# Patient Record
Sex: Female | Born: 1955 | Hispanic: Yes | Marital: Married | Smoking: Current some day smoker
Health system: Southern US, Community
[De-identification: ages and names within clinical notes are randomized; demographics above are authoritative.]

## PROBLEM LIST (undated history)

## (undated) DIAGNOSIS — E785 Hyperlipidemia, unspecified: Secondary | ICD-10-CM

## (undated) DIAGNOSIS — F32A Depression, unspecified: Secondary | ICD-10-CM

## (undated) DIAGNOSIS — M1990 Unspecified osteoarthritis, unspecified site: Secondary | ICD-10-CM

## (undated) DIAGNOSIS — N644 Mastodynia: Secondary | ICD-10-CM

## (undated) DIAGNOSIS — G4700 Insomnia, unspecified: Secondary | ICD-10-CM

## (undated) DIAGNOSIS — D496 Neoplasm of unspecified behavior of brain: Secondary | ICD-10-CM

## (undated) DIAGNOSIS — D320 Benign neoplasm of cerebral meninges: Secondary | ICD-10-CM

## (undated) DIAGNOSIS — F419 Anxiety disorder, unspecified: Secondary | ICD-10-CM

## (undated) DIAGNOSIS — F329 Major depressive disorder, single episode, unspecified: Secondary | ICD-10-CM

## (undated) DIAGNOSIS — I10 Essential (primary) hypertension: Secondary | ICD-10-CM

## (undated) DIAGNOSIS — D329 Benign neoplasm of meninges, unspecified: Secondary | ICD-10-CM

## (undated) DIAGNOSIS — M797 Fibromyalgia: Secondary | ICD-10-CM

## (undated) HISTORY — DX: Hyperlipidemia, unspecified: E78.5

## (undated) HISTORY — PX: CARPAL TUNNEL RELEASE: SHX101

## (undated) HISTORY — DX: Major depressive disorder, single episode, unspecified: F32.9

## (undated) HISTORY — DX: Benign neoplasm of cerebral meninges: D32.0

## (undated) HISTORY — DX: Mastodynia: N64.4

## (undated) HISTORY — DX: Anxiety disorder, unspecified: F41.9

## (undated) HISTORY — DX: Unspecified osteoarthritis, unspecified site: M19.90

## (undated) HISTORY — PX: ABDOMINAL HYSTERECTOMY: SHX81

## (undated) HISTORY — DX: Depression, unspecified: F32.A

## (undated) HISTORY — PX: BILATERAL CARPAL TUNNEL RELEASE: SHX6508

## (undated) HISTORY — PX: BREAST SURGERY: SHX581

## (undated) HISTORY — PX: EYE SURGERY: SHX253

## (undated) HISTORY — DX: Insomnia, unspecified: G47.00

## (undated) HISTORY — DX: Essential (primary) hypertension: I10

## (undated) HISTORY — DX: Benign neoplasm of meninges, unspecified: D32.9

---

## 1978-06-15 HISTORY — PX: BREAST BIOPSY: SHX20

## 1983-06-16 HISTORY — PX: BREAST BIOPSY: SHX20

## 2008-06-15 HISTORY — PX: BELPHAROPTOSIS REPAIR: SHX369

## 2008-06-15 HISTORY — PX: ABDOMINAL HYSTERECTOMY: SHX81

## 2009-07-17 ENCOUNTER — Emergency Department: Payer: Self-pay | Admitting: Emergency Medicine

## 2009-08-01 ENCOUNTER — Ambulatory Visit: Payer: Self-pay | Admitting: Internal Medicine

## 2010-01-13 ENCOUNTER — Ambulatory Visit: Payer: Self-pay | Admitting: Internal Medicine

## 2010-02-13 ENCOUNTER — Ambulatory Visit: Payer: Self-pay | Admitting: Internal Medicine

## 2010-08-07 ENCOUNTER — Ambulatory Visit: Payer: Self-pay | Admitting: Internal Medicine

## 2010-08-14 ENCOUNTER — Emergency Department: Payer: Self-pay | Admitting: Emergency Medicine

## 2010-08-14 ENCOUNTER — Emergency Department (HOSPITAL_COMMUNITY)
Admission: EM | Admit: 2010-08-14 | Discharge: 2010-08-15 | Disposition: A | Discharge status: Home / Self Care | Payer: MEDICARE | Attending: Emergency Medicine | Admitting: Emergency Medicine

## 2010-08-14 DIAGNOSIS — M25529 Pain in unspecified elbow: Secondary | ICD-10-CM | POA: Insufficient documentation

## 2010-08-14 DIAGNOSIS — F329 Major depressive disorder, single episode, unspecified: Secondary | ICD-10-CM | POA: Insufficient documentation

## 2010-08-14 DIAGNOSIS — I10 Essential (primary) hypertension: Secondary | ICD-10-CM | POA: Insufficient documentation

## 2010-08-14 DIAGNOSIS — F3289 Other specified depressive episodes: Secondary | ICD-10-CM | POA: Insufficient documentation

## 2010-08-14 DIAGNOSIS — E039 Hypothyroidism, unspecified: Secondary | ICD-10-CM | POA: Insufficient documentation

## 2010-08-14 DIAGNOSIS — M25569 Pain in unspecified knee: Secondary | ICD-10-CM | POA: Insufficient documentation

## 2010-08-14 DIAGNOSIS — Z79899 Other long term (current) drug therapy: Secondary | ICD-10-CM | POA: Insufficient documentation

## 2010-08-14 DIAGNOSIS — M546 Pain in thoracic spine: Secondary | ICD-10-CM | POA: Insufficient documentation

## 2010-09-18 ENCOUNTER — Ambulatory Visit: Payer: Self-pay | Admitting: Internal Medicine

## 2011-02-06 ENCOUNTER — Other Ambulatory Visit: Payer: Self-pay | Admitting: *Deleted

## 2011-02-06 NOTE — Telephone Encounter (Signed)
Per Dr. Dan Humphreys we can not call this into pharmacy in [place]. Tried calling patient to let her know that we would be able to call it in here, got no answer. Left message asking that she call me back.

## 2011-02-06 NOTE — Telephone Encounter (Signed)
Fine to fill for one month supply, 1 refill, but MUST set up follow up appointment.

## 2011-02-06 NOTE — Telephone Encounter (Signed)
Patient wants this called in to [facility] in [place].

## 2011-02-09 ENCOUNTER — Other Ambulatory Visit: Payer: Self-pay | Admitting: Internal Medicine

## 2011-02-09 DIAGNOSIS — G4700 Insomnia, unspecified: Secondary | ICD-10-CM

## 2011-02-09 MED ORDER — ZOLPIDEM TARTRATE 5 MG PO TABS: 5.0000 mg | ORAL_TABLET | Freq: Every evening | ORAL | Status: DC | PRN

## 2011-02-09 NOTE — Telephone Encounter (Signed)
Fine to fill one month of this medication, but pt must set up appointment.  And, we cannot [place] of [place].  (She has requested this in the past.)

## 2011-02-09 NOTE — Telephone Encounter (Signed)
Fine to fill one month. Pt must set up an appointment

## 2011-02-10 ENCOUNTER — Telehealth: Payer: Self-pay | Admitting: Internal Medicine

## 2011-02-10 NOTE — Telephone Encounter (Signed)
FAX NUMBER TO [place] [facility] [phone] PER PATIENT HUSBAND

## 2011-02-10 NOTE — Telephone Encounter (Signed)
Needs a rx for predisone   Sent to [facility] in [place]  Please advise pt husbands or if you have any question please call him

## 2011-02-11 ENCOUNTER — Other Ambulatory Visit: Payer: Self-pay | Admitting: Internal Medicine

## 2011-02-11 NOTE — Telephone Encounter (Signed)
I will not provide refills on medications to a [place] pharmacy.  I will not refill or prescribe prednisone. Pt should find a provider in [place] if they have moved there.

## 2011-02-12 ENCOUNTER — Other Ambulatory Visit: Payer: Self-pay | Admitting: Internal Medicine

## 2011-02-12 NOTE — Telephone Encounter (Signed)
Patient's husband notified  

## 2011-02-27 ENCOUNTER — Telehealth: Payer: Self-pay | Admitting: Internal Medicine

## 2011-02-27 NOTE — Telephone Encounter (Signed)
Spoke with husband. He says that patient has been complaining of side pain that is worse after eating. Has been going on for a few days. I offered an appt for Monday, but husband says that he wanted her seen today because he is off work. I advised him that we have no openings today. He scheduled her an appt for next wed because he is off that day because she also needs some refills on some of her meds. He is asking if she can be worked in today,. Please advise.

## 2011-02-27 NOTE — Telephone Encounter (Signed)
I looked and the schedule (for both myself and teresa) is full today. She will need to go to urgent care or ED, or be seen next week.

## 2011-02-27 NOTE — Telephone Encounter (Signed)
Patient advised.

## 2011-02-27 NOTE — Telephone Encounter (Signed)
Husband called said his wife was out of state and just come back and is complaining of side pain

## 2011-03-04 ENCOUNTER — Ambulatory Visit (INDEPENDENT_AMBULATORY_CARE_PROVIDER_SITE_OTHER): Payer: Medicare Other | Admitting: Internal Medicine

## 2011-03-04 ENCOUNTER — Ambulatory Visit: Payer: Self-pay | Admitting: Internal Medicine

## 2011-03-04 ENCOUNTER — Encounter: Payer: Self-pay | Admitting: Internal Medicine

## 2011-03-04 VITALS — BP 150/89 | HR 65 | Temp 98.9°F | Resp 20 | Ht 60.5 in | Wt 181.0 lb

## 2011-03-04 DIAGNOSIS — F411 Generalized anxiety disorder: Secondary | ICD-10-CM

## 2011-03-04 DIAGNOSIS — R1011 Right upper quadrant pain: Secondary | ICD-10-CM

## 2011-03-04 DIAGNOSIS — F419 Anxiety disorder, unspecified: Secondary | ICD-10-CM

## 2011-03-04 DIAGNOSIS — I10 Essential (primary) hypertension: Secondary | ICD-10-CM

## 2011-03-04 DIAGNOSIS — H6090 Unspecified otitis externa, unspecified ear: Secondary | ICD-10-CM

## 2011-03-04 DIAGNOSIS — H60399 Other infective otitis externa, unspecified ear: Secondary | ICD-10-CM

## 2011-03-04 LAB — COMPREHENSIVE METABOLIC PANEL
ALT: 19 U/L (ref 0–35)
Albumin: 4.2 g/dL (ref 3.5–5.2)
Alkaline Phosphatase: 91 U/L (ref 39–117)
CO2: 26 mEq/L (ref 19–32)
GFR: 80.98 mL/min (ref >60.00)
Glucose, Bld: 129 mg/dL — ABNORMAL HIGH (ref 70–99)
Potassium: 3.9 mEq/L (ref 3.5–5.1)
Sodium: 141 mEq/L (ref 135–145)
Total Protein: 7.5 g/dL (ref 6.0–8.3)

## 2011-03-04 LAB — CBC WITH DIFFERENTIAL/PLATELET
Eosinophils Relative: 3.2 % (ref 0.0–5.0)
Monocytes Relative: 7.9 % (ref 3.0–12.0)
Neutrophils Relative %: 70.8 % (ref 43.0–77.0)
Platelets: 325 10*3/uL (ref 150.0–400.0)
WBC: 7.6 10*3/uL (ref 4.5–10.5)

## 2011-03-04 MED ORDER — ALPRAZOLAM 2 MG PO TABS: 2.0000 mg | ORAL_TABLET | Freq: Two times a day (BID) | ORAL | Status: DC | PRN

## 2011-03-04 MED ORDER — CIPROFLOXACIN-DEXAMETHASONE 0.3-0.1 % OT SUSP: 4.0000 [drp] | Freq: Two times a day (BID) | OTIC | Status: DC

## 2011-03-04 NOTE — Progress Notes (Signed)
Subjective:    Patient ID: Alice Simmons, female    DOB: 1956/05/09, 55 y.o.   MRN: [id number]  HPI Alice Simmons is a 55 year old female with a history of anxiety who presents for an acute visit complaining of right upper quadrant abdominal pain, nausea, vomiting which has been persistent for approximately 2 weeks.  In regards to the abdominal pain she notes that it first developed approximately 2-3 weeks ago. It occurred after she to large meal consisting of pork. Since that time she has had intermittent severe right upper quadrant pain with nausea and vomiting after eating meals, especially with fatty foods. She denies any diarrhea, bloody stools. She does occasionally have constipation.  She also complains of one to 2 days of sore throat and right ear pain. She denies any fever, chills, shortness of breath, palpitations. She has drainage from her right ear and reports that it is tender to touch.  Outpatient Encounter Prescriptions as of 03/04/2011  Medication Sig Dispense Refill  . alprazolam (XANAX) 2 MG tablet Take 1 tablet (2 mg total) by mouth 2 (two) times daily as needed.  60 tablet  5  . labetalol (NORMODYNE) 300 MG tablet Take 300 mg by mouth 2 (two) times daily.        Marland Kitchen DISCONTD: alprazolam (XANAX) 2 MG tablet Take 2 mg by mouth 2 (two) times daily as needed.        . ciprofloxacin-dexamethasone (CIPRODEX) otic suspension Place 4 drops into the right ear 2 (two) times daily.  7.5 mL  0  . lisinopril (PRINIVIL,ZESTRIL) 40 MG tablet Take 40 mg by mouth daily.       Marland Kitchen PARoxetine (PAXIL) 30 MG tablet Take 30 mg by mouth 2 (two) times daily.       . propranolol (INDERAL) 40 MG tablet Take 40 mg by mouth 2 (two) times daily.       Marland Kitchen zolpidem (AMBIEN) 5 MG tablet Take 1 tablet (5 mg total) by mouth at bedtime as needed for sleep.  30 tablet  0    Review of Systems  Constitutional: Negative for fever, chills, appetite change, fatigue and unexpected weight change.  HENT: Positive for ear  pain, congestion, sore throat and postnasal drip. Negative for trouble swallowing, neck pain, voice change and sinus pressure.   Eyes: Negative for visual disturbance.  Respiratory: Negative for cough, shortness of breath, wheezing and stridor.   Cardiovascular: Negative for chest pain, palpitations and leg swelling.  Gastrointestinal: Positive for nausea, vomiting and abdominal pain. Negative for diarrhea, constipation, blood in stool, abdominal distention and anal bleeding.  Genitourinary: Negative for dysuria and flank pain.  Musculoskeletal: Negative for myalgias, arthralgias and gait problem.  Skin: Negative for color change and rash.  Neurological: Negative for dizziness and headaches.  Hematological: Negative for adenopathy. Does not bruise/bleed easily.  Psychiatric/Behavioral: Negative for suicidal ideas, sleep disturbance and dysphoric mood. The patient is nervous/anxious.    BP 150/89  Pulse 65  Temp(Src) 98.9 F (37.2 C) (Oral)  Resp 20  Ht 5' 0.5" (1.537 m)  Wt 181 lb (82.101 kg)  BMI 34.77 kg/m2  SpO2 95%     Objective:   Physical Exam  Constitutional: She is oriented to person, place, and time. She appears well-developed and well-nourished. No distress.  HENT:  Head: Normocephalic and atraumatic.  Right Ear: Hearing, tympanic membrane and external ear normal.  Left Ear: Hearing, tympanic membrane and external ear normal.  Nose: Nose normal.  Mouth/Throat: Oropharynx is clear and moist.  No oropharyngeal exudate.       Right ear canal is erythematous with some debris, tragus is slighlty tender to palpation  Eyes: Conjunctivae are normal. Pupils are equal, round, and reactive to light. Right eye exhibits no discharge. Left eye exhibits no discharge. No scleral icterus.  Neck: Normal range of motion. Neck supple. No tracheal deviation present. No thyromegaly present.  Cardiovascular: Normal rate, regular rhythm, normal heart sounds and intact distal pulses.  Exam reveals  no gallop and no friction rub.   No murmur heard. Pulmonary/Chest: Effort normal and breath sounds normal. No respiratory distress. She has no wheezes. She has no rales. She exhibits no tenderness.  Abdominal: Soft. There is tenderness in the right upper quadrant. There is guarding and positive Murphy's sign.  Musculoskeletal: Normal range of motion. She exhibits no edema and no tenderness.  Lymphadenopathy:    She has no cervical adenopathy.  Neurological: She is alert and oriented to person, place, and time. No cranial nerve deficit. She exhibits normal muscle tone. Coordination normal.  Skin: Skin is warm and dry. No rash noted. She is not diaphoretic. No erythema. No pallor.  Psychiatric: She has a normal mood and affect. Her behavior is normal. Judgment and thought content normal.          Assessment & Plan:  1. RUQ abdominal pain - patient with right upper quadrant pain and nausea and vomiting after eating fatty foods which is consistent with cholecystitis. Will get CBC and CMP with labs today. Will get a right upper quadrant ultrasound for evaluation. I will call them with results.  2. Right Otitis Externa - patient with right ear pain and exam consistent with right otitis externa. Will treat with Ciprodex. She has a followup in one week. She will call sooner if symptoms are not improving.  3. Anxiety - patient with general anxiety disorder. Doing very well using alprazolam. Her husband notes some difficulty sleeping at night recently we will plan to address this at her visit in one week once acute issues with right upper quadrant abdominal pain have resolved.  4. Hypertension - blood pressure is elevated today, however, improved compared to previous readings. We'll plan to check renal function with labs today. She will return to clinic in one week.

## 2011-03-04 NOTE — Patient Instructions (Signed)
Gallbladder Disease (Cholecystitis) Gallbladder disease (cholecystitis) is an inflammation of your gallbladder. It is usually caused by a build-up of stones (gallstones) or sludge (cholelithiasis) in your gallbladder. The gallbladder is not an essential organ. It is located slightly to the right of center in the belly (abdomen), behind the liver. It stores bile made in the liver. Bile aids in digestion of fats. Gallbladder disease may result in nausea (feeling sick to your stomach), abdominal pain, and jaundice. In severe cases, emergency surgery may be required. The most common type of gallbladder disease is gallstones. They begin as small crystals and slowly grow into stones. Gallstone pain occurs when the bile duct has spasms. The spasms are caused by the stone passing out of the duct. The stone is trying to pass at the same time bile is passing into the small bowel for digestion. The pain usually begins suddenly. It may persist from several minutes to several hours. Infection can occur. Infection can add to discomfort and severity of an acute attack. The pain may be made worse by breathing deeply or by being jarred. There may be fever and tenderness to the touch. In some cases, when gallstones do not move into the bile duct, people have no pain or symptoms. These are called "silent" gallstones. Women are three times more likely to develop gallstones than men. Women who have had several pregnancies are more likely to have gallbladder disease. Physicians sometimes advise removing diseased gallbladders before future pregnancies. Other factors that increase the risk of gallbladder disease are obesity, diets heavy in fried foods and dairy products, increasing age, prolonged use of medications containing female hormones, and heredity. HOME CARE INSTRUCTIONS  If your physician prescribed an antibiotic, take as directed.   Only take over-the-counter or prescription medicines for pain, discomfort, or fever as  directed by your caregiver.   Follow a low fat diet until seen again. (Fat causes the gallbladder to contract.)   Follow-up as instructed. Attacks are almost always recurrent and surgery is usually required for permanent treatment.  SEEK IMMEDIATE MEDICAL CARE IF:  Pain is increasing and not controlled by medications.   The pain moves to another part of your abdomen or to your back. (Right sided pain can be appendicitis and left sided pain in adults can be diverticulitis).   You have an oral temperature above 101.5 develops, is not controlled by medication, or as your caregiver suggests.   You develop nausea and vomiting.  Document Released: 06/01/2005 Document Re-Released: 11/18/2007 [facility] Patient Information 2011 [place], [place]. Dejar de fumar (Smoking Cessation) Este folleto explica la mejor manera de dejar de fumar, as como los nuevos tratamientos que podrn [place]. Le proporciona informacin acerca de los nuevos medicamentos que pueden doblar o Media planner las posibilidades de que usted abandone el hbito de fumar y de que lo haga para siempre. Tambin le indica el modo de evitar las recadas y responder a Environmental manager preocupaciones que usted pueda tener, incluyendo el aumento de [place].  LA NICOTINA: UNA ADICCIN PODEROSA  Si usted ha tratado [place] vez dejar de fumar, ya sabe lo difcil que resulta. Es una tarea ardua porque la nicotina es una droga [place]. Para algunas personas, puede ser tan adictiva como la herona o la cocana. Abandonar el hbito es muy difcil. Generalmente, se hacen 2  3 intentos, o ms, antes de poder dejarlo definitivamente. Cada vez que usted haga un intento, aprender qu cosas lo ayudan y que cosas lo perjudican. Dejar de fumar es una tarea difcil  y conlleva un gran esfuerzo, pero usted Buyer, retail. ABANDONAR EL HBITO DE FUMAR ES UNA DE LAS COSAS MS IMPORTANTES QUE HABR HECHO EN SU VIDA:  Tendr una vida ms larga y de mejor calidad.   El  impacto en el organismo se siente casi inmediatamente.   Dentro de los 20 minutos la presin arterial disminuye. El pulso vuelve a sus valores normales.   Despus de 8 horas, los niveles de monxido de carbono en la sangre vuelven a la normalidad. Aumenta el nivel de oxgeno.   Despus de 24 horas, la probabilidad de infarto comienza a disminuir. La respiracin, el cabello y el cuerpo ya no huelen a humo.   Luego de 48 horas, los nervios daados comienzan a recuperarse. Mejoran el sentido del gusto y Cabin crew.   Luego de 72 horas, el organismo est virtualmente libre de nicotina. Los conductos bronquiales se relajan, la respiracin se normaliza.   Despus de 2 a 12 semanas, los pulmones pueden contener ms aire. Se facilita la actividad fsica y mejora la respiracin.   Disminuir las probabilidades de sufrir ataques al corazn, derrames cerebrales, cncer o enfermedades pulmonares.   Despus de 1 ao, el riesgo de coronariopatas disminuye a la mitad.   Despus de 5 aos, el riesgo de ictus disminuye al nivel de un no fumador.   Despus de 10 aos, el riesgo de cncer de pulmn disminuye a la mitad, y el riesgo de sufrir otros tipos de cncer disminuye considerablemente.   Despus de 15 aos, el riesgo de coronariopatas disminuye, generalmente al nivel de un no fumador.   Si est embarazada, al dejar de fumar aumentar las probabilidades de tener un beb sano.   Las personas con las que [place], especialmente sus hijos, estarn ms saludables.   Tendr dinero extra para gastar en otras cosas que no sean cigarrillos.  CINCO CLAVES PARA DEJAR DE FUMAR Algunos estudios han demostrado que estos cinco pasos lo ayudarn a abandonar el hbito y hacerlo para siempre. Usted tiene mejores probabilidades de abandonar el hbito si cumple con estos pasos al mismo tiempo:  1. Preprese. 2. Busque apoyo y [place]. 3. Aprenda nuevas destrezas y conductas. 4. Consiga medicamentos para reducir la  adiccin a la nicotina y selos correctamente. 5. Preprese para cuando sufra una recada o una situacin difcil y tome la determinacin de seguir tratando de abandonar el hbito, aunque no tenga xito al principio. 1. PREPRESE  Establezca una fecha para dejar de fumar.   Modifique su entorno.   Deshgase de TODOS los cigarrillos y ceniceros que haya en su casa, en el auto y en el lugar de [place].   No permita que nadie fume dentro de su casa.   Repase sus intentos anteriores. Piense en qu cosas funcionaron y cules no.   Una vez que haya dejado, no fume ms, NI SIQUIERA UNA BOCANADA!  2. BUSQUE AYUDA Y ESTMULO Algunos estudios han demostrado que usted tiene mejores probabilidades de tener xito si [place]. Puede obtener apoyo de [employer]:  Dgale a sus familiares, amigos y compaeros de trabajo que usted dejar de fumar y que necesita su apoyo. Pdales que no fumen alrededor suyo y que no dejen cigarrillos a la vista.   Hable con el profesional que lo asiste (por ejemplo, con su mdico, dentista, enfermero, farmacutico, psiclogo).   Obtenga consejos y apoyo individual, grupal o telefnico. Cuanto ms consejera reciba, mejores sern las posibilidades de que deje de fumar. Hay programas que se ofrecen  en hospitales y centros mdicos locales. Comunquese con el departamento de salud de su localidad para obtener informacin acerca de los programas disponibles en su rea.   Las creencias y prcticas espirituales pueden ayudar a los fumadores a abandonar el hbito.   Los medidores para dejar de fumar son pequeos programas computarizados que mantienen el registro estadstico del "tiempo de dejar", cigarrillos no fumados, dinero ahorrado (por ejemplo vase la pgina web [email or web address]).   Muchos fumadores encuentran uno o ms de los muchos libros de [place] disponibles son tiles para ayudarlo a dejar de fumar.  3. APRENDA NUEVAS DESTREZAS Y  CONDUCTAS  Trate de entretenerse con otra cosa cuando sienta ganas de fumar. Hable con alguien, salga a caminar u ocpese en alguna tarea.   Cuando comience a abandonar el hbito de fumar, [place]. Utilice una ruta diferente para llegar al [place]. Beba t en vez de caf. Desayune en un lugar diferente.   Haga algo para reducir la tensin emocional. Tome un bao caliente, practique alguna actividad fsica o lea un libro.   Planee hacer cada da algo que disfrute. Recompnsese por no fumar.   Vale la pena explorar los programas informticos interactivos que se especializan en ayudarlo a dejar de fumar.  4. OBTENGA LOS MEDICAMENTOS Y UTILCELOS CORRECTAMENTE. Hay medicamentos que pueden ayudarlo a dejar de fumar y a reducir las ganas. Si combina los [employer] con los mtodos de [place] y el apoyo pueden Nurse, learning disability las probabilidades de dejar de fumar exitosamente. La Administracin de Alimentos y Engineer, agricultural de Surveyor, minerals., (FDA) ha aprobado 7 medicamentos que lo ayudarn a International aid/development worker. Estos medicamentos se dividen en 3 categoras.  La terapia de reemplazo de nicotina (enva nicotina al organismo sin los [place] y los riesgos del fumar).   Goma de Theatre manager de nicotina-disponible sin receta   Pastillas de nicotina-disponible sin Armed forces logistics/support/administrative officer de nicotina-disponible con receta   Atomizador nasal de nicotina-disponible con receta   Parche de nicotina transdrmico-disponible con receta y sin receta.   Los antidepresivos ayudan a las personas a Animal nutritionist de Art therapist, Biomedical engineer no se conoce cul es el mecanismo.   Comprimidos de Bupropion SR-disponible con receta   Agonistas receptores de nicotina (simulan el efecto de la nicotina en el cerebro)   Comprimidos de Varenicline Tartrate-disponible con receta   Consulte con su mdico para pedir consejo acerca de los medicamentos que [place], cmo usarlos y lea detenidamente la informacin del envase.   Todos los que tratan de dejar de  fumar pueden beneficiarse con el uso de un medicamento. Si est embarazada o trata de quedar embarazada, est amamantando, tiene menos de 18 aos o fuma menos de 10 cigarrillos por Futures trader, converse con su mdico antes de usar los medicamentos de reemplazo de la nicotina.   Debe dejar de usar los reemplazos de la nicotina y Freight forwarder a su mdico si siente nuseas, mareos, debilidad, mareos, vmitos, latidos cardacos rpidos, problemas en la boca debido a las pastillas o a la goma de Theatre manager, u observa enrojecimiento o hinchazn en la piel que rodea el parche y no mejora.   No use otro producto que contenga nicotina mientras use un reemplazo.   Converse con su mdico antes de usar estos productos si tiene diabetes, sufre una enfermedad cardaca, asma o lceras de estmago, ha sufrido recientemente un infarto, tiene hipertensin arterial que no puede controlar con medicamentos, tiene una historia de latidos cardacos irregulares o le han prescripto medicamentos para ayudarlo a dejar de fumar.  5. EST PREPARADO PARA UNA RECADA O PARA SITUACIONES DIFCILES.  La mayor parte de las recadas se producen dentro de los 3 primeros meses de abandonar el hbito. No se desanime si comienza a fumar de nuevo. Recuerde, la [employer] tratan varias veces de dejar de fumar antes de lograrlo.   Podr sufrir sndrome de abstinencia porque su cuerpo est acostumbrado a la nicotina, la sustancia adictiva que [employer]. Podr sentir el deseo compulsivo de fumar, irritabilidad, enojo, tos, cefaleas y dificultad para concentrarse.   Estos sntomas son transitorios. Son ms intensos en un comienzo, pero desaparecern en 10 a 14 das.  He aqu algunas situaciones difciles de las cuales hay que estar prevenido:   Alcohol. Evite consumir alcohol. El beber disminuye sus posibilidades de xito.   Cafena. Reduzca la cantidad de cafena que consume, porque su ingesta disminuye la probabilidad de xito.    Otros fumadores. Estar rodeado de personas que fuman puede hacer que usted desee [place]. Evite a los fumadores   [employer]. Muchos fumadores aumentarn de peso cuando dejen de fumar, generalmente menos de 4,5 Kg. Consuma una dieta saludable y [place]. No deje que el aumento de peso lo distraiga de su meta principal que es dejar de fumar. Algunos medicamentos para dejar de fumar pueden retrasar el aumento de [place]. Siempre podr perder [employer] que se gane despus de dejar de fumar.   Mal humor o depresin. Hay muchas formas de mejorar su estado de nimo en lugar de hacerlo fumando.  Si tiene problemas con alguna de estas situaciones, converse con su mdico. SITUACIONES O CONDICIONES ESPECIALES Algunos estudios indican que todas las personas pueden dejar de fumar. Su situacin o condicin pueden darle un motivo especial para abandonar este hbito.  Mujeres embarazadas o mams recientes: Al dejar de fumar, protege la salud del beb y la suya.   Pacientes hospitalizados: Al abandonar el hbito se reducen los problemas de salud y se ayuda a la curacin.   Pacientes que han sufrido un ataque al corazn: De este modo se reduce el riesgo de un segundo ataque al corazn.   Pacientes con cncer de pulmn, cabeza y cuello: Al dejar de fumar se reduce el riesgo de un segundo cncer.   Padres de nios y adolescentes: Si abandona el hbito, protege a sus nios y Automotive engineer de enfermedades causadas por el humo como fumador pasivo.  PREGUNTAS PARA REFLEXIONAR Piense en las siguientes preguntas antes de tratar de dejar de fumar. Quizs desee hablar acerca de sus preguntas con el profesional que lo asiste.  Por qu desea dejar de fumar?   Cundo trat de dejar de fumar en el pasado, qu le ayud y qu no le ayud?   Cules sern las situaciones ms difciles para usted despus de dejar de fumar? Cmo planea manejarlas?   Quin puede ayudarlo en los momentos difciles? Su familia?  Sus amigos? El profesional que lo asiste?   Qu placeres obtiene cuando fuma? De qu manera puede seguir obteniendo placer si abandona el hbito?  Estas son algunas preguntas para hacrselas al profesional que lo asiste.  Cmo puede ayudarme a dejar de fumar con xito?   Qu medicamento cree que sera el mejor para m, y cmo debo tomarlo?   Qu debo hacer si necesito ms ayuda?   Cmo es la desintoxicacin del cigarrillo? Cmo puedo obtener informacin acerca de la desintoxicacin?  Dejar de fumar es una tarea difcil y conlleva un gran esfuerzo, Biomedical engineer  usted Buyer, retail. PARA OBTENER MS INFORMACIN Smokefree.gov (http://www.davis-sullivan.com/) provee asistencia tcnica profesional e informacin precisa, gratuita, y basada en evidencia para ayudar a las necesidades inmediatas y de largo plazo de las personas que dejan de fumar.  Document Released: 06/01/2005 Document Re-Released: 11/19/2009 [facility] Patient Information 2011 [place], [place].

## 2011-03-05 ENCOUNTER — Telehealth: Payer: Self-pay | Admitting: Internal Medicine

## 2011-03-05 NOTE — Telephone Encounter (Signed)
Talked with husband and informed him that Alice Simmons's appointment was setup but he is going to call and setup a time with his schedule.  I faxed her OV notes and her labs to Dr. Katrinka Blazing.

## 2011-03-10 ENCOUNTER — Telehealth: Payer: Self-pay | Admitting: Internal Medicine

## 2011-03-10 ENCOUNTER — Encounter: Payer: Self-pay | Admitting: Internal Medicine

## 2011-03-10 ENCOUNTER — Ambulatory Visit (INDEPENDENT_AMBULATORY_CARE_PROVIDER_SITE_OTHER): Payer: Medicare Other | Admitting: Internal Medicine

## 2011-03-10 DIAGNOSIS — H6090 Unspecified otitis externa, unspecified ear: Secondary | ICD-10-CM

## 2011-03-10 DIAGNOSIS — R1011 Right upper quadrant pain: Secondary | ICD-10-CM

## 2011-03-10 DIAGNOSIS — K819 Cholecystitis, unspecified: Secondary | ICD-10-CM

## 2011-03-10 DIAGNOSIS — H60399 Other infective otitis externa, unspecified ear: Secondary | ICD-10-CM

## 2011-03-10 DIAGNOSIS — G4700 Insomnia, unspecified: Secondary | ICD-10-CM

## 2011-03-10 MED ORDER — CIPROFLOXACIN-DEXAMETHASONE 0.3-0.1 % OT SUSP: 4.0000 [drp] | Freq: Two times a day (BID) | OTIC | Status: AC

## 2011-03-10 MED ORDER — ZOLPIDEM TARTRATE 10 MG PO TABS: 10.0000 mg | ORAL_TABLET | Freq: Every evening | ORAL | Status: DC | PRN

## 2011-03-10 NOTE — Progress Notes (Signed)
Subjective:    Patient ID: Alice Simmons, female    DOB: 11/12/1955, 55 y.o.   MRN: [id number]  HPI 54YO female with recent history of RUQ abdominal pain presents for follow up. In the interim since last visit, she had an abdominal US which showed gallstones. Her husband set up evaluation with General Surgery, but this is not until the middle of October.  She reports severe RUQ pain with eating. She also c/o nausea and vomiting.  She has limited her po intake because of this.  Her husband, who is with her today, is very concerned about her depression.  He reports that she is increasingly depressed and feels isolated because of her panic disorder. Last year, we had sent her to a psychiatrist who tried to supplement her paxil with abilify, but this made her very agitated. She is not interested in changing her medication, or seeing a psychiatrist or psychologist.  She feels this only makes her symptoms worse.  She is not sleeping well and the pain in her abdomen seems to be making her depression and anxiety worse.  Outpatient Encounter Prescriptions as of 03/10/2011  Medication Sig Dispense Refill  . alprazolam (XANAX) 2 MG tablet Take 1 tablet (2 mg total) by mouth 2 (two) times daily as needed.  60 tablet  5  . labetalol (NORMODYNE) 300 MG tablet Take 300 mg by mouth 2 (two) times daily.        Marland Kitchen lisinopril (PRINIVIL,ZESTRIL) 40 MG tablet Take 40 mg by mouth daily.       Marland Kitchen PARoxetine (PAXIL) 30 MG tablet Take 30 mg by mouth 2 (two) times daily.       . propranolol (INDERAL) 40 MG tablet Take 40 mg by mouth 2 (two) times daily.       . ciprofloxacin-dexamethasone (CIPRODEX) otic suspension Place 4 drops into the right ear 2 (two) times daily.  7.5 mL  0  . zolpidem (AMBIEN) 10 MG tablet Take 1 tablet (10 mg total) by mouth at bedtime as needed for sleep.  30 tablet  0    Review of Systems  Constitutional: Positive for fatigue. Negative for fever and chills.  Respiratory: Negative for chest  tightness and shortness of breath.   Cardiovascular: Negative for chest pain.  Gastrointestinal: Positive for nausea, vomiting and abdominal pain. Negative for diarrhea, constipation and blood in stool.  Psychiatric/Behavioral: Positive for sleep disturbance and dysphoric mood. Negative for suicidal ideas and self-injury. The patient is nervous/anxious.        Objective:   Physical Exam  Constitutional: She is oriented to person, place, and time. She appears well-developed and well-nourished. No distress.  HENT:  Head: Normocephalic and atraumatic.  Right Ear: External ear normal.  Left Ear: External ear normal.  Nose: Nose normal.  Mouth/Throat: Oropharynx is clear and moist. No oropharyngeal exudate.  Eyes: Conjunctivae are normal. Pupils are equal, round, and reactive to light. Right eye exhibits no discharge. Left eye exhibits no discharge. No scleral icterus.  Neck: Normal range of motion. Neck supple. No tracheal deviation present. No thyromegaly present.  Cardiovascular: Normal rate, regular rhythm, normal heart sounds and intact distal pulses.  Exam reveals no gallop and no friction rub.   No murmur heard. Pulmonary/Chest: Effort normal and breath sounds normal. No respiratory distress. She has no wheezes. She has no rales. She exhibits no tenderness.  Abdominal: There is tenderness in the right upper quadrant. There is guarding. There is no rigidity.  Musculoskeletal: Normal range of motion.  She exhibits no edema and no tenderness.  Lymphadenopathy:    She has no cervical adenopathy.  Neurological: She is alert and oriented to person, place, and time. No cranial nerve deficit. She exhibits normal muscle tone. Coordination normal.  Skin: Skin is warm and dry. No rash noted. She is not diaphoretic. No erythema. No pallor.  Psychiatric: Her speech is normal and behavior is normal. Judgment and thought content normal. Her mood appears anxious. Cognition and memory are normal. She  exhibits a depressed mood.          Assessment & Plan:  1. RUQ pain - Consistent with cholecystitis. Labs including LFTs were normal at last visit and RUQ US showed gallstones. Based on her worsening symptoms, I think she needs more urgent surgical evaluation than 3 weeks from now, so will try to expedite this.  2. Depression and Anxiety - Longstanding. Likely made worse by pain and ongoing medical issues. Refuses to try new medication or referral for counseling or psych evaluation.  Will continue to revisit this at next visit.

## 2011-03-10 NOTE — Telephone Encounter (Signed)
I have called Mrs. Rubianes spoke with Mr. Mardene Sayer and he is aware of the appointment tomorrow afternoon.

## 2011-03-10 NOTE — Patient Instructions (Signed)
We will call and try to expedite surgical evaluation. Follow up in 1 month here.

## 2011-03-11 ENCOUNTER — Observation Stay: Payer: Self-pay | Admitting: Surgery

## 2011-03-25 ENCOUNTER — Ambulatory Visit: Payer: Self-pay | Admitting: Surgery

## 2011-03-27 ENCOUNTER — Encounter: Payer: Self-pay | Admitting: Internal Medicine

## 2011-03-27 ENCOUNTER — Ambulatory Visit (INDEPENDENT_AMBULATORY_CARE_PROVIDER_SITE_OTHER): Payer: Medicare Other | Admitting: Internal Medicine

## 2011-03-27 DIAGNOSIS — I10 Essential (primary) hypertension: Secondary | ICD-10-CM

## 2011-03-27 DIAGNOSIS — G4700 Insomnia, unspecified: Secondary | ICD-10-CM

## 2011-03-27 DIAGNOSIS — R109 Unspecified abdominal pain: Secondary | ICD-10-CM

## 2011-03-27 DIAGNOSIS — A048 Other specified bacterial intestinal infections: Secondary | ICD-10-CM

## 2011-03-27 MED ORDER — ZOLPIDEM TARTRATE ER 12.5 MG PO TBCR: 12.5000 mg | EXTENDED_RELEASE_TABLET | Freq: Every evening | ORAL | Status: DC | PRN

## 2011-03-27 NOTE — Progress Notes (Signed)
Subjective:    Patient ID: Alice Simmons, female    DOB: [birth year], 55 y.o.   MRN: [id number]  HPI [name] is  a 55 year old female with a recent history of cholecystitis status post cholecystectomy. Postoperatively she developed persistent abdominal pain and was tested for H. pylori which was positive. She was started on antibiotics including clarithromycin and Flagyl as well as therapy with omeprazole, however she has not started this. She reports persistent nausea and vomiting. She reports persistent diffuse abdominal pain. She denies any fever or chills. She denies any constipation or diarrhea. She had an upper GI performed earlier this week and review of results today show that the study was normal.  She also reports recent worsening of her insomnia. She has been taking Ambien at night to help with sleep. Recently she is unable to follow sleep using Ambien. She has very poorly controlled anxiety and depression and has been followed by psychiatry in the past. With her recent illness her anxiety has significantly worsened.  Outpatient Encounter Prescriptions as of 03/27/2011  Medication Sig Dispense Refill  . clarithromycin (BIAXIN) 500 MG tablet Take 500 mg by mouth 2 (two) times daily.        . metroNIDAZOLE (FLAGYL) 500 MG tablet Take 500 mg by mouth 2 (two) times daily.        Marland Kitchen alprazolam (XANAX) 2 MG tablet Take 1 tablet (2 mg total) by mouth 2 (two) times daily as needed.  60 tablet  5  . labetalol (NORMODYNE) 300 MG tablet Take 300 mg by mouth 2 (two) times daily.        Marland Kitchen lisinopril (PRINIVIL,ZESTRIL) 40 MG tablet Take 40 mg by mouth daily.       Marland Kitchen PARoxetine (PAXIL) 30 MG tablet Take 30 mg by mouth 2 (two) times daily.       . propranolol (INDERAL) 40 MG tablet Take 40 mg by mouth 2 (two) times daily.       Marland Kitchen zolpidem (AMBIEN CR) 12.5 MG CR tablet Take 1 tablet (12.5 mg total) by mouth at bedtime as needed for sleep.  30 tablet  1  . DISCONTD: zolpidem (AMBIEN) 10 MG tablet  Take 1 tablet (10 mg total) by mouth at bedtime as needed for sleep.  30 tablet  0    Review of Systems  Constitutional: Negative for fever, chills, appetite change, fatigue and unexpected weight change.  HENT: Negative for ear pain, congestion, sore throat, trouble swallowing, neck pain, voice change and sinus pressure.   Eyes: Negative for visual disturbance.  Respiratory: Negative for cough, shortness of breath, wheezing and stridor.   Cardiovascular: Negative for chest pain, palpitations and leg swelling.  Gastrointestinal: Positive for nausea and abdominal pain. Negative for vomiting, diarrhea, constipation, blood in stool, abdominal distention and anal bleeding.  Genitourinary: Negative for dysuria and flank pain.  Musculoskeletal: Negative for myalgias, arthralgias and gait problem.  Skin: Negative for color change and rash.  Neurological: Negative for dizziness and headaches.  Hematological: Negative for adenopathy. Does not bruise/bleed easily.  Psychiatric/Behavioral: Positive for sleep disturbance and dysphoric mood. Negative for suicidal ideas. The patient is nervous/anxious.    BP 116/74  Pulse 67  Temp(Src) 98.6 F (37 C) (Oral)  Resp 20  Ht 5\' 5"  (1.651 m)  Wt 183 lb (83.008 kg)  BMI 30.45 kg/m2  SpO2 96%     Objective:   Physical Exam  Constitutional: She is oriented to person, place, and time. She appears well-developed and well-nourished. No  distress.  HENT:  Head: Normocephalic and atraumatic.  Right Ear: External ear normal.  Left Ear: External ear normal.  Nose: Nose normal.  Mouth/Throat: Oropharynx is clear and moist. No oropharyngeal exudate.  Eyes: Conjunctivae are normal. Pupils are equal, round, and reactive to light. Right eye exhibits no discharge. Left eye exhibits no discharge. No scleral icterus.  Neck: Normal range of motion. Neck supple. No tracheal deviation present. No thyromegaly present.  Cardiovascular: Normal rate, regular rhythm, normal  heart sounds and intact distal pulses.  Exam reveals no gallop and no friction rub.   No murmur heard. Pulmonary/Chest: Effort normal and breath sounds normal. No respiratory distress. She has no wheezes. She has no rales. She exhibits no tenderness.  Abdominal: Soft. There is tenderness (diffuse, most severe RUQ).  Musculoskeletal: Normal range of motion. She exhibits no edema and no tenderness.  Lymphadenopathy:    She has no cervical adenopathy.  Neurological: She is alert and oriented to person, place, and time. No cranial nerve deficit. She exhibits normal muscle tone. Coordination normal.  Skin: Skin is warm and dry. No rash noted. She is not diaphoretic. No erythema. No pallor.  Psychiatric: Her speech is normal and behavior is normal. Judgment and thought content normal. Her mood appears anxious. Cognition and memory are normal.          Assessment & Plan:  1. Abdominal pain - Likely secondary to H.Pylori infection. She was started on Flagyl and Clarithromycin and Omeprazole by Dr. Katrinka Blazing.  I reviewed UGI which was normal. She has not started the omeprazole. I strongly encouraged her to start this. If no improvement, will likely need EGD for eval. Will see her back in 1 month.  2. Insomnia - Worsening. Likely secondary to pain and nausea associated with recent GI issues. Will try changing to long acting Ambien to see if any improvement.  She has poorly controlled anxiety and depression and I recommended re-eval with psychiatry, but she is not interested in this right now. She will follow up in 1 month.

## 2011-03-27 NOTE — Patient Instructions (Addendum)
Finish antibiotics. Start new dose Ambien. Follow up in 1 month.  Helicobacter Pylori Disease Often patients with stomach or duodenal ulcers not caused by irritants, are infected with a germ. The germ is called helicobacter pylori (H. pylori). This bacterium lives on the surface of stomach and small bowel. It can cause redness, soreness and ulcers. Ulcers are a hole in the lining of your stomach or small bowel. Blood and special breath tests can detect if you are infected with H. pylori. Tests can be done on samples taken from the stomach if you have endoscopy. After treatment you may have tests to prove you are cured. These can be done about a month after you finish the treatment or as your caregiver suggests. Most infections can be cured with a combination of antibiotics. Antibiotics are medications which kill germs such as H. pylori. Anti-ulcer medicines which block stomach acid secretion may also be used. Treatment will be continued for the time your caregiver suggests. Call your caregiver if you need more information about H. pylori. Call also if your symptoms get worse during or after treatment. You will not need a special diet. Avoid:  Smoking.  Aspirin.   Ibuprofen.  Other anti-inflammatory drugs.   Alcohol and spicy foods may also make your symptoms worse. The best advice is to avoid anything you find upsetting to your stomach. SEEK IMMEDIATE MEDICAL CARE IF YOU DEVELOP:  Sharp, sudden, lasting stomach pain.   Bloody vomit or vomit that looks like coffee grounds.   Bloody or black stools.   A light headed feeling, fainting, or become weak and sweaty.  Document Released: 06/01/2005 Document Re-Released: 03/10/2008 [facility] Patient Information 2011 [place], [place].

## 2011-04-02 ENCOUNTER — Other Ambulatory Visit: Payer: Self-pay | Admitting: *Deleted

## 2011-04-02 MED ORDER — AMLODIPINE BESYLATE 10 MG PO TABS: 10.0000 mg | ORAL_TABLET | Freq: Every day | ORAL | Status: DC

## 2011-04-05 ENCOUNTER — Other Ambulatory Visit: Payer: Self-pay | Admitting: Internal Medicine

## 2011-04-06 ENCOUNTER — Encounter: Payer: Self-pay | Admitting: Internal Medicine

## 2011-04-14 ENCOUNTER — Other Ambulatory Visit: Payer: Self-pay | Admitting: Internal Medicine

## 2011-04-14 DIAGNOSIS — G4700 Insomnia, unspecified: Secondary | ICD-10-CM

## 2011-04-14 MED ORDER — ZOLPIDEM TARTRATE ER 12.5 MG PO TBCR: 12.5000 mg | EXTENDED_RELEASE_TABLET | Freq: Every evening | ORAL | Status: DC | PRN

## 2011-04-18 NOTE — Telephone Encounter (Signed)
Faxed into pharm  

## 2011-04-29 ENCOUNTER — Telehealth: Payer: Self-pay | Admitting: Internal Medicine

## 2011-04-29 NOTE — Telephone Encounter (Signed)
OK to restart, ambien 10mg  disp 30 with 3 refill

## 2011-04-29 NOTE — Telephone Encounter (Signed)
Ok to resume ambien 10 mg?

## 2011-04-29 NOTE — Telephone Encounter (Signed)
[phone] Pt husband came in wanting refill on zolpidem 10mg  tablets take one tablet by mouth at bedtime as needed for sleep [facility]  [address] Mr Ricky Ala stated that  Dr walker  Wrote out another rx for higher dose  This was to expensive and he didn't have this refilled.  Please let them know when rx is called in

## 2011-04-30 MED ORDER — ZOLPIDEM TARTRATE 10 MG PO TABS: 10.0000 mg | ORAL_TABLET | Freq: Every evening | ORAL | Status: DC | PRN

## 2011-04-30 NOTE — Telephone Encounter (Signed)
Called in RX, left vm for pt to check w/pharm

## 2011-05-05 ENCOUNTER — Telehealth: Payer: Self-pay | Admitting: *Deleted

## 2011-05-05 MED ORDER — LISINOPRIL 40 MG PO TABS: 40.0000 mg | ORAL_TABLET | Freq: Every day | ORAL | Status: DC

## 2011-05-05 NOTE — Telephone Encounter (Signed)
Done

## 2011-06-05 ENCOUNTER — Other Ambulatory Visit: Payer: Self-pay | Admitting: Internal Medicine

## 2011-06-07 LAB — H. PYLORI BREATH TEST: H. pylori UBiT: POSITIVE — AB

## 2011-06-10 ENCOUNTER — Telehealth: Payer: Self-pay | Admitting: *Deleted

## 2011-06-10 ENCOUNTER — Other Ambulatory Visit: Payer: Self-pay | Admitting: *Deleted

## 2011-06-10 MED ORDER — TETRACYCLINE HCL 500 MG PO CAPS: 500.0000 mg | ORAL_CAPSULE | Freq: Four times a day (QID) | ORAL | Status: DC

## 2011-06-10 MED ORDER — CLARITHROMYCIN ER 500 MG PO TB24: 1000.0000 mg | ORAL_TABLET | Freq: Every day | ORAL | Status: AC

## 2011-06-10 MED ORDER — METRONIDAZOLE 500 MG PO TABS: 500.0000 mg | ORAL_TABLET | Freq: Two times a day (BID) | ORAL | Status: AC

## 2011-06-10 MED ORDER — BISMUTH SUBSALICYLATE 525 MG/15ML PO SUSP: 15.0000 mL | Freq: Four times a day (QID) | ORAL | Status: DC

## 2011-06-10 MED ORDER — PANTOPRAZOLE SODIUM 40 MG PO TBEC: 40.0000 mg | DELAYED_RELEASE_TABLET | Freq: Two times a day (BID) | ORAL | Status: DC

## 2011-06-10 MED ORDER — METRONIDAZOLE 250 MG PO TABS: 250.0000 mg | ORAL_TABLET | Freq: Four times a day (QID) | ORAL | Status: DC

## 2011-06-10 NOTE — Telephone Encounter (Signed)
Left detailed VM for pt, She has pcn allergy, will need alt RX to prevpac. I left vm requesting a call back w/name of pharm, orders are pending.

## 2011-06-10 NOTE — Telephone Encounter (Signed)
[facility] called - tetracycline is not avail in any form or strength. She has pcn allergy also, please advise.

## 2011-06-10 NOTE — Telephone Encounter (Signed)
Message copied by Vernie Murders on Wed Jun 10, 2011 10:55 AM ------      Message from: Ronna Polio A      Created: Wed Jun 10, 2011  8:14 AM       We should treat with Prevpac. She will need repeat breath test to confirm cure after treatment.

## 2011-06-10 NOTE — Telephone Encounter (Signed)
Before I send this - clarithromycin is only avail in ER form at this time, sorry. Pharm had advised me of this when we first spoke.

## 2011-06-10 NOTE — Telephone Encounter (Signed)
We can prescribe 1. Metronidazole 500mg  po bid x 10 days and 2. Clarithromycin 500mg  po bid x 10 days. 3. Protonix 40mg  po bid x 10 days

## 2011-06-10 NOTE — Telephone Encounter (Signed)
We can do Clarithromycin XL 1000mg  daily x10 days. She should take with food.

## 2011-06-10 NOTE — Telephone Encounter (Signed)
Spoke w/husband, RX sent in

## 2011-06-22 ENCOUNTER — Other Ambulatory Visit: Payer: Self-pay | Admitting: *Deleted

## 2011-06-22 MED ORDER — PAROXETINE HCL 30 MG PO TABS: 30.0000 mg | ORAL_TABLET | Freq: Two times a day (BID) | ORAL | Status: DC

## 2011-07-17 ENCOUNTER — Telehealth: Payer: Self-pay | Admitting: *Deleted

## 2011-07-17 DIAGNOSIS — A048 Other specified bacterial intestinal infections: Secondary | ICD-10-CM

## 2011-07-17 NOTE — Telephone Encounter (Signed)
Husband stopped by for labs and said pt needed f/u breath test after completing abx for h pylori. Ordered test and gave husband order.

## 2011-07-22 ENCOUNTER — Other Ambulatory Visit: Payer: Self-pay | Admitting: Internal Medicine

## 2011-07-29 ENCOUNTER — Telehealth: Payer: Self-pay | Admitting: *Deleted

## 2011-07-29 DIAGNOSIS — A048 Other specified bacterial intestinal infections: Secondary | ICD-10-CM

## 2011-07-29 MED ORDER — METRONIDAZOLE 500 MG PO TABS: 500.0000 mg | ORAL_TABLET | Freq: Two times a day (BID) | ORAL | Status: AC

## 2011-07-29 MED ORDER — TETRACYCLINE HCL 500 MG PO CAPS: 500.0000 mg | ORAL_CAPSULE | Freq: Two times a day (BID) | ORAL | Status: AC

## 2011-07-29 MED ORDER — OMEPRAZOLE 20 MG PO CPDR: 20.0000 mg | DELAYED_RELEASE_CAPSULE | Freq: Two times a day (BID) | ORAL | Status: DC

## 2011-07-29 NOTE — Telephone Encounter (Signed)
Message copied by Vernie Murders on Wed Jul 29, 2011 12:41 PM ------      Message from: Ronna Polio A      Created: Mon Jul 27, 2011 11:28 AM       We can call in Tetracycline 500mg , Metronidazole 500mg , Bismuth subcitrate 240mg , and omeprazole 20mg  all twice daily x 14 days.  If we cannot get this or she cannot tolerate this, let me know. She will need to have confirmation of cure with breath test.

## 2011-07-29 NOTE — Telephone Encounter (Signed)
I spoke w/the pharmacist. He said that bismuth citrate is a powder that a compounding pharm may be able to help with but not aware of any subcitrate form. Do you want me to check w/compounding pharm? Or have pt take pepto?

## 2011-07-29 NOTE — Telephone Encounter (Signed)
Spoke w/pharm and MD - compounding pharmacist stated that the pepto was same, subcitrate is only binding salt and there was not difference. Will have pt take pepto 30 ml qid

## 2011-07-29 NOTE — Telephone Encounter (Signed)
Let's check with a compounding pharmacy.

## 2011-07-30 ENCOUNTER — Telehealth: Payer: Self-pay | Admitting: *Deleted

## 2011-07-30 MED ORDER — DOXYCYCLINE HYCLATE 100 MG PO TABS: 100.0000 mg | ORAL_TABLET | Freq: Two times a day (BID) | ORAL | Status: AC

## 2011-07-30 NOTE — Telephone Encounter (Signed)
Doxycycline 100mg bid x 14 days.

## 2011-07-30 NOTE — Telephone Encounter (Signed)
Pharm called - tetracycline has been discontinued. Please advise.

## 2011-07-30 NOTE — Telephone Encounter (Signed)
Done, left detailed VM for pt

## 2011-08-20 ENCOUNTER — Other Ambulatory Visit: Payer: Self-pay | Admitting: *Deleted

## 2011-08-20 MED ORDER — OMEPRAZOLE 20 MG PO CPDR: 20.0000 mg | DELAYED_RELEASE_CAPSULE | Freq: Two times a day (BID) | ORAL | Status: DC

## 2011-08-28 ENCOUNTER — Telehealth: Payer: Self-pay | Admitting: *Deleted

## 2011-08-28 MED ORDER — ZOLPIDEM TARTRATE 10 MG PO TABS: 10.0000 mg | ORAL_TABLET | Freq: Every evening | ORAL | Status: DC | PRN

## 2011-08-28 NOTE — Telephone Encounter (Signed)
Pharm faxed RF request -  Ambien 10 mg 1 qhs

## 2011-08-28 NOTE — Telephone Encounter (Signed)
Ok to fill. Needs follow up.

## 2011-08-28 NOTE — Telephone Encounter (Signed)
Called in.

## 2011-09-08 ENCOUNTER — Other Ambulatory Visit: Payer: Self-pay | Admitting: *Deleted

## 2011-09-08 MED ORDER — LISINOPRIL 40 MG PO TABS: 40.0000 mg | ORAL_TABLET | Freq: Every day | ORAL | Status: DC

## 2011-09-22 ENCOUNTER — Other Ambulatory Visit: Payer: Self-pay | Admitting: *Deleted

## 2011-09-22 DIAGNOSIS — F419 Anxiety disorder, unspecified: Secondary | ICD-10-CM

## 2011-09-22 MED ORDER — ALPRAZOLAM 2 MG PO TABS: 2.0000 mg | ORAL_TABLET | Freq: Two times a day (BID) | ORAL | Status: DC | PRN

## 2011-09-22 NOTE — Telephone Encounter (Signed)
Received faxed refill request from pharmacy. Is it okay to refill medication? 

## 2011-09-23 ENCOUNTER — Other Ambulatory Visit: Payer: Self-pay | Admitting: *Deleted

## 2011-09-23 MED ORDER — OMEPRAZOLE 20 MG PO CPDR: 20.0000 mg | DELAYED_RELEASE_CAPSULE | Freq: Two times a day (BID) | ORAL | Status: DC

## 2011-09-23 MED ORDER — PROPRANOLOL HCL 40 MG PO TABS: 40.0000 mg | ORAL_TABLET | Freq: Two times a day (BID) | ORAL | Status: DC

## 2011-09-28 ENCOUNTER — Other Ambulatory Visit: Payer: Self-pay | Admitting: *Deleted

## 2011-09-28 MED ORDER — LABETALOL HCL 300 MG PO TABS: 300.0000 mg | ORAL_TABLET | Freq: Two times a day (BID) | ORAL | Status: DC

## 2011-10-19 ENCOUNTER — Other Ambulatory Visit: Payer: Self-pay | Admitting: *Deleted

## 2011-10-19 MED ORDER — AMLODIPINE BESYLATE 10 MG PO TABS: 10.0000 mg | ORAL_TABLET | Freq: Every day | ORAL | Status: DC

## 2011-10-19 MED ORDER — OMEPRAZOLE 20 MG PO CPDR: 20.0000 mg | DELAYED_RELEASE_CAPSULE | Freq: Two times a day (BID) | ORAL | Status: DC

## 2011-10-19 NOTE — Telephone Encounter (Signed)
Done

## 2011-10-27 ENCOUNTER — Other Ambulatory Visit: Payer: Self-pay | Admitting: *Deleted

## 2011-10-27 MED ORDER — ZOLPIDEM TARTRATE 10 MG PO TABS: 10.0000 mg | ORAL_TABLET | Freq: Every evening | ORAL | Status: DC | PRN

## 2011-10-27 NOTE — Telephone Encounter (Signed)
Fine to fill. 

## 2011-10-27 NOTE — Telephone Encounter (Signed)
Zolpidem requested [last refill 03.15.13 #[id number] Last OV 10.12.12]/SLS Please advise.

## 2011-10-27 NOTE — Telephone Encounter (Signed)
Done

## 2011-11-05 ENCOUNTER — Ambulatory Visit (INDEPENDENT_AMBULATORY_CARE_PROVIDER_SITE_OTHER): Payer: Medicare Other | Admitting: Internal Medicine

## 2011-11-05 ENCOUNTER — Encounter: Payer: Self-pay | Admitting: Internal Medicine

## 2011-11-05 VITALS — BP 124/79 | HR 62 | Temp 98.6°F | Resp 14 | Wt 195.2 lb

## 2011-11-05 DIAGNOSIS — R5383 Other fatigue: Secondary | ICD-10-CM | POA: Insufficient documentation

## 2011-11-05 DIAGNOSIS — Z0000 Encounter for general adult medical examination without abnormal findings: Secondary | ICD-10-CM

## 2011-11-05 DIAGNOSIS — Z79899 Other long term (current) drug therapy: Secondary | ICD-10-CM

## 2011-11-05 DIAGNOSIS — R5381 Other malaise: Secondary | ICD-10-CM

## 2011-11-05 DIAGNOSIS — J0100 Acute maxillary sinusitis, unspecified: Secondary | ICD-10-CM | POA: Insufficient documentation

## 2011-11-05 LAB — COMPREHENSIVE METABOLIC PANEL
CO2: 24 mEq/L (ref 19–32)
Calcium: 9.5 mg/dL (ref 8.4–10.5)
Chloride: 104 mEq/L (ref 96–112)
Creatinine, Ser: 0.9 mg/dL (ref 0.4–1.2)
GFR: 71.31 mL/min (ref >60.00)
Glucose, Bld: 97 mg/dL (ref 70–99)
Sodium: 141 mEq/L (ref 135–145)
Total Bilirubin: 0.2 mg/dL — ABNORMAL LOW (ref 0.3–1.2)
Total Protein: 7.5 g/dL (ref 6.0–8.3)

## 2011-11-05 LAB — CBC WITH DIFFERENTIAL/PLATELET
Basophils Absolute: 0.1 10*3/uL (ref 0.0–0.1)
Eosinophils Absolute: 0.3 10*3/uL (ref 0.0–0.7)
Lymphocytes Relative: 17.3 % (ref 12.0–46.0)
Lymphs Abs: 1.6 10*3/uL (ref 0.7–4.0)
Monocytes Relative: 8.9 % (ref 3.0–12.0)
Platelets: 438 10*3/uL — ABNORMAL HIGH (ref 150.0–400.0)
RDW: 14.6 % (ref 11.5–14.6)

## 2011-11-05 LAB — LIPID PANEL
Cholesterol: 269 mg/dL — ABNORMAL HIGH (ref 0–200)
VLDL: 79.6 mg/dL — ABNORMAL HIGH (ref 0.0–40.0)

## 2011-11-05 MED ORDER — AZITHROMYCIN 250 MG PO TABS: ORAL_TABLET | ORAL | Status: AC

## 2011-11-05 NOTE — Progress Notes (Signed)
Subjective:    Patient ID: Alice Simmons, female    DOB: 03-22-1956, 56 y.o.   MRN: [id number]  HPI 56 year old female with history of anxiety, hypertension presents for acute visit complaining of one to two-week history of nasal congestion, right-sided facial pressure, cough productive of purulent sputum, fatigue. She denies any fever or chills. She denies any chest pain. She denies shortness of breath. She has not been taking any medications for this.  Her and her husband are very concerned about progressive fatigue. She notes that she sleeps poorly, waking frequently through the night. She also has ongoing issues with anxiety and depression. She reports normal appetite. She denies any change in bowel habits.  Outpatient Encounter Prescriptions as of 11/05/2011  Medication Sig Dispense Refill  . alprazolam (XANAX) 2 MG tablet Take 1 tablet (2 mg total) by mouth 2 (two) times daily as needed.  60 tablet  1  . amLODipine (NORVASC) 10 MG tablet Take 1 tablet (10 mg total) by mouth daily.  30 tablet  5  . labetalol (NORMODYNE) 300 MG tablet Take 1 tablet (300 mg total) by mouth 2 (two) times daily.  180 tablet  0  . lisinopril (PRINIVIL,ZESTRIL) 40 MG tablet Take 1 tablet (40 mg total) by mouth daily.  90 tablet  1  . omeprazole (PRILOSEC) 20 MG capsule Take 1 capsule (20 mg total) by mouth 2 (two) times daily.  60 capsule  0  . PARoxetine (PAXIL) 30 MG tablet Take 1 tablet (30 mg total) by mouth 2 (two) times daily.  180 tablet  1  . propranolol (INDERAL) 40 MG tablet Take 1 tablet (40 mg total) by mouth 2 (two) times daily.  60 tablet  5  . zolpidem (AMBIEN) 10 MG tablet Take 1 tablet (10 mg total) by mouth at bedtime as needed for sleep.  30 tablet  1  . azithromycin (ZITHROMAX Z-PAK) 250 MG tablet Take 2 pills day 1, then 1 pill daily days 2-5  6 each  0  . Bismuth Subsalicylate 525 MG/15ML SUSP Take 15 mLs (525 mg total) by mouth 4 (four) times daily.  600 mL  0  . pantoprazole (PROTONIX) 40  MG tablet Take 1 tablet (40 mg total) by mouth 2 (two) times daily.  20 tablet  0   BP 124/79  Pulse 62  Temp(Src) 98.6 F (37 C) (Oral)  Resp 14  Wt 195 lb 4 oz (88.565 kg)  SpO2 96%  Review of Systems  Constitutional: Positive for fatigue. Negative for fever, chills, appetite change and unexpected weight change.  HENT: Positive for congestion, rhinorrhea and sinus pressure. Negative for ear pain, sore throat, trouble swallowing, neck pain and voice change.   Eyes: Negative for visual disturbance.  Respiratory: Positive for cough. Negative for shortness of breath, wheezing and stridor.   Cardiovascular: Negative for chest pain, palpitations and leg swelling.  Gastrointestinal: Negative for nausea, vomiting, abdominal pain, diarrhea, constipation, blood in stool, abdominal distention and anal bleeding.  Genitourinary: Negative for dysuria and flank pain.  Musculoskeletal: Negative for myalgias, arthralgias and gait problem.  Skin: Negative for color change and rash.  Neurological: Negative for dizziness and headaches.  Hematological: Negative for adenopathy. Does not bruise/bleed easily.  Psychiatric/Behavioral: Positive for sleep disturbance and dysphoric mood. Negative for suicidal ideas. The patient is nervous/anxious.        Objective:   Physical Exam  Constitutional: She is oriented to person, place, and time. She appears well-developed and well-nourished. No distress.  HENT:  Head: Normocephalic and atraumatic.  Right Ear: Tympanic membrane, external ear and ear canal normal. Tympanic membrane is not erythematous and not bulging.  Left Ear: Tympanic membrane, external ear and ear canal normal. Tympanic membrane is not erythematous and not bulging.  Nose: Mucosal edema present.  Mouth/Throat: Oropharynx is clear and moist. No oropharyngeal exudate.  Eyes: Conjunctivae are normal. Pupils are equal, round, and reactive to light. Right eye exhibits no discharge. Left eye exhibits  no discharge. No scleral icterus.  Neck: Normal range of motion. Neck supple. No tracheal deviation present. No thyromegaly present.  Cardiovascular: Normal rate, regular rhythm, normal heart sounds and intact distal pulses.  Exam reveals no gallop and no friction rub.   No murmur heard. Pulmonary/Chest: Effort normal and breath sounds normal. No respiratory distress. She has no wheezes. She has no rales. She exhibits no tenderness.  Musculoskeletal: Normal range of motion. She exhibits no edema and no tenderness.  Lymphadenopathy:    She has no cervical adenopathy.  Neurological: She is alert and oriented to person, place, and time. No cranial nerve deficit. She exhibits normal muscle tone. Coordination normal.  Skin: Skin is warm and dry. No rash noted. She is not diaphoretic. No erythema. No pallor.  Psychiatric: She has a normal mood and affect. Her behavior is normal. Judgment and thought content normal.          Assessment & Plan:

## 2011-11-05 NOTE — Assessment & Plan Note (Signed)
Likely multifactorial. Patient currently has sinus infection. She also has history of very poor sleep habits, depression and anxiety. Will check basic lab work today including CBC, CMP, TSH. Followup one month.

## 2011-11-05 NOTE — Assessment & Plan Note (Signed)
Symptoms and exam are consistent with right maxillary sinusitis. Will treat with azithromycin. Patient will use ibuprofen as needed for pain. She will use Claritin or Zyrtec. She will use decongestants sparingly given her history of hypertension. She will call if symptoms are not improving over the next week.

## 2011-11-05 NOTE — Patient Instructions (Signed)
Start antibiotics. Use Claritin or Zyrtec. May use decongestant sparingly for nasal congestion. Followup 2 weeks.

## 2011-11-06 LAB — TSH: TSH: 6.42 u[IU]/mL — ABNORMAL HIGH (ref 0.35–5.50)

## 2011-11-10 ENCOUNTER — Other Ambulatory Visit: Payer: Self-pay | Admitting: *Deleted

## 2011-11-10 MED ORDER — LEVOTHYROXINE SODIUM 50 MCG PO TABS: 50.0000 ug | ORAL_TABLET | Freq: Every day | ORAL | Status: DC

## 2011-11-11 ENCOUNTER — Ambulatory Visit: Payer: Self-pay | Admitting: Gastroenterology

## 2011-11-16 ENCOUNTER — Other Ambulatory Visit: Payer: Self-pay | Admitting: *Deleted

## 2011-11-16 MED ORDER — OMEPRAZOLE 20 MG PO CPDR: 20.0000 mg | DELAYED_RELEASE_CAPSULE | Freq: Two times a day (BID) | ORAL | Status: DC

## 2011-11-17 ENCOUNTER — Encounter: Payer: Self-pay | Admitting: Internal Medicine

## 2011-11-17 ENCOUNTER — Ambulatory Visit (INDEPENDENT_AMBULATORY_CARE_PROVIDER_SITE_OTHER): Payer: Medicare Other | Admitting: Internal Medicine

## 2011-11-17 VITALS — BP 92/60 | HR 65 | Temp 98.9°F | Ht 65.0 in | Wt 199.5 lb

## 2011-11-17 DIAGNOSIS — E785 Hyperlipidemia, unspecified: Secondary | ICD-10-CM

## 2011-11-17 DIAGNOSIS — E039 Hypothyroidism, unspecified: Secondary | ICD-10-CM | POA: Insufficient documentation

## 2011-11-17 DIAGNOSIS — R5383 Other fatigue: Secondary | ICD-10-CM

## 2011-11-17 DIAGNOSIS — R5381 Other malaise: Secondary | ICD-10-CM

## 2011-11-17 DIAGNOSIS — G4700 Insomnia, unspecified: Secondary | ICD-10-CM

## 2011-11-17 MED ORDER — ESZOPICLONE 3 MG PO TABS: 3.0000 mg | ORAL_TABLET | Freq: Every day | ORAL | Status: DC

## 2011-11-17 NOTE — Progress Notes (Signed)
Subjective:    Patient ID: Alice Simmons, female    DOB: July 18, 1955, 56 y.o.   MRN: [id number]  HPI 56 year old female with history of depression, hypertension, and recent fatigue presents for followup. At her last visit, lab work was remarkable for elevated TSH consistent with hypothyroidism. She was started on Synthroid 50 mcg daily approximately 2 weeks ago. She reports no improvement over the last 2 weeks. She notes persistent fatigue even with minimal activity such as walking across the room to perform a task. She denies any focal symptoms such as chest pain, shortness of breath. She also notes significant weight gain over the last year.  In regards to her insomnia, she reports very poor sleep even with the use of Ambien. She is interested in trying other medications to help with sleep. Her husband notes that she is often awake until 2 or 3 in the morning despite the use of Ambien.  Outpatient Encounter Prescriptions as of 11/17/2011  Medication Sig Dispense Refill  . alprazolam (XANAX) 2 MG tablet Take 1 tablet (2 mg total) by mouth 2 (two) times daily as needed.  60 tablet  1  . amLODipine (NORVASC) 10 MG tablet Take 1 tablet (10 mg total) by mouth daily.  30 tablet  5  . Bismuth Subsalicylate 525 MG/15ML SUSP Take 15 mLs (525 mg total) by mouth 4 (four) times daily.  600 mL  0  . Eszopiclone (ESZOPICLONE) 3 MG TABS Take 1 tablet (3 mg total) by mouth at bedtime. Take immediately before bedtime  30 tablet  0  . labetalol (NORMODYNE) 300 MG tablet Take 1 tablet (300 mg total) by mouth 2 (two) times daily.  180 tablet  0  . levothyroxine (SYNTHROID) 50 MCG tablet Take 1 tablet (50 mcg total) by mouth daily.  30 tablet  2  . lisinopril (PRINIVIL,ZESTRIL) 40 MG tablet Take 1 tablet (40 mg total) by mouth daily.  90 tablet  1  . omeprazole (PRILOSEC) 20 MG capsule Take 1 capsule (20 mg total) by mouth 2 (two) times daily.  60 capsule  11  . pantoprazole (PROTONIX) 40 MG tablet Take 1 tablet (40  mg total) by mouth 2 (two) times daily.  20 tablet  0  . PARoxetine (PAXIL) 30 MG tablet Take 1 tablet (30 mg total) by mouth 2 (two) times daily.  180 tablet  1  . propranolol (INDERAL) 40 MG tablet Take 1 tablet (40 mg total) by mouth 2 (two) times daily.  60 tablet  5  . DISCONTD: zolpidem (AMBIEN) 10 MG tablet Take 1 tablet (10 mg total) by mouth at bedtime as needed for sleep.  30 tablet  1    Review of Systems  Constitutional: Positive for fatigue. Negative for fever, chills, appetite change and unexpected weight change.  Eyes: Negative for visual disturbance.  Respiratory: Negative for cough and shortness of breath.   Cardiovascular: Negative for chest pain, palpitations and leg swelling.  Gastrointestinal: Negative for vomiting and abdominal pain.  Genitourinary: Negative for dysuria and flank pain.  Musculoskeletal: Negative for myalgias, arthralgias and gait problem.  Skin: Negative for color change and rash.  Neurological: Negative for dizziness and headaches.  Hematological: Negative for adenopathy. Does not bruise/bleed easily.  Psychiatric/Behavioral: Positive for dysphoric mood. Negative for suicidal ideas and sleep disturbance. The patient is nervous/anxious.    BP 92/60  Pulse 65  Temp(Src) 98.9 F (37.2 C) (Oral)  Ht 5\' 5"  (1.651 m)  Wt 199 lb 8 oz (90.493 kg)  BMI 33.20 kg/m2  SpO2 97%     Objective:   Physical Exam  Constitutional: She is oriented to person, place, and time. She appears well-developed and well-nourished. No distress.  HENT:  Head: Normocephalic and atraumatic.  Right Ear: External ear normal.  Left Ear: External ear normal.  Nose: Nose normal.  Mouth/Throat: Oropharynx is clear and moist. No oropharyngeal exudate.  Eyes: Conjunctivae are normal. Pupils are equal, round, and reactive to light. Right eye exhibits no discharge. Left eye exhibits no discharge. No scleral icterus.  Neck: Normal range of motion. Neck supple. No tracheal deviation  present. No thyromegaly present.  Cardiovascular: Normal rate, regular rhythm, normal heart sounds and intact distal pulses.  Exam reveals no gallop and no friction rub.   No murmur heard. Pulmonary/Chest: Effort normal and breath sounds normal. No respiratory distress. She has no wheezes. She has no rales. She exhibits no tenderness.  Musculoskeletal: Normal range of motion. She exhibits no edema and no tenderness.  Lymphadenopathy:    She has no cervical adenopathy.  Neurological: She is alert and oriented to person, place, and time. No cranial nerve deficit. She exhibits normal muscle tone. Coordination normal.  Skin: Skin is warm and dry. No rash noted. She is not diaphoretic. No erythema. No pallor.  Psychiatric: She has a normal mood and affect. Her behavior is normal. Judgment and thought content normal.          Assessment & Plan:

## 2011-11-17 NOTE — Assessment & Plan Note (Signed)
Recent blood work showed TSH elevated. Patient was started on Synthroid 50 mcg daily. Will plan to repeat TSH in another 4 weeks.

## 2011-11-17 NOTE — Assessment & Plan Note (Signed)
Suspect symptoms of fatigue secondary to hypothyroidism and insomnia. Treating hypothyroidism with Synthroid. Will start Lunesta to try to improve symptoms of insomnia. Followup one month.

## 2011-11-17 NOTE — Assessment & Plan Note (Signed)
No improvement with Ambien. Will change to Lunesta. Patient will call if symptoms are not improving. Followup one month.

## 2011-11-17 NOTE — Assessment & Plan Note (Signed)
Recent lipids show elevated LDL above goal of less than 100. Encouraged healthy diet, low in saturated fat and high in fiber. We'll plan to repeat lipids after adequate treatment of hypothyroidism. If no improvement, will plan to start statin medication.

## 2011-11-25 ENCOUNTER — Encounter: Payer: Self-pay | Admitting: Internal Medicine

## 2011-12-02 ENCOUNTER — Ambulatory Visit (INDEPENDENT_AMBULATORY_CARE_PROVIDER_SITE_OTHER): Payer: Medicare Other | Admitting: Internal Medicine

## 2011-12-02 ENCOUNTER — Telehealth: Payer: Self-pay | Admitting: Internal Medicine

## 2011-12-02 ENCOUNTER — Encounter: Payer: Self-pay | Admitting: Internal Medicine

## 2011-12-02 VITALS — BP 110/80 | HR 58 | Temp 98.4°F | Ht 65.0 in | Wt 200.8 lb

## 2011-12-02 DIAGNOSIS — I10 Essential (primary) hypertension: Secondary | ICD-10-CM

## 2011-12-02 DIAGNOSIS — R1310 Dysphagia, unspecified: Secondary | ICD-10-CM

## 2011-12-02 DIAGNOSIS — E039 Hypothyroidism, unspecified: Secondary | ICD-10-CM

## 2011-12-02 LAB — TSH: TSH: 4.21 u[IU]/mL (ref 0.35–5.50)

## 2011-12-02 LAB — COMPREHENSIVE METABOLIC PANEL
Albumin: 4.2 g/dL (ref 3.5–5.2)
Alkaline Phosphatase: 106 U/L (ref 39–117)
BUN: 15 mg/dL (ref 6–23)
CO2: 27 mEq/L (ref 19–32)
Calcium: 9.3 mg/dL (ref 8.4–10.5)
GFR: 66.07 mL/min (ref >60.00)
Glucose, Bld: 110 mg/dL — ABNORMAL HIGH (ref 70–99)
Potassium: 4 mEq/L (ref 3.5–5.1)
Sodium: 140 mEq/L (ref 135–145)
Total Protein: 7.5 g/dL (ref 6.0–8.3)

## 2011-12-02 LAB — T4, FREE: Free T4: 0.71 ng/dL (ref 0.60–1.60)

## 2011-12-02 MED ORDER — LEVOTHYROXINE SODIUM 100 MCG PO TABS: 100.0000 ug | ORAL_TABLET | Freq: Every day | ORAL | Status: DC

## 2011-12-02 MED ORDER — LOSARTAN POTASSIUM 50 MG PO TABS: 50.0000 mg | ORAL_TABLET | Freq: Every day | ORAL | Status: DC

## 2011-12-02 NOTE — Telephone Encounter (Signed)
Attempted to call patient and sent my chart message. Would like to stop lisinopril and start losartan. New prescription has been called in. Patient will need a followup in one week for BMP and blood pressure check.

## 2011-12-02 NOTE — Assessment & Plan Note (Addendum)
Patient with worsening dysphasia, hypothyroidism and enlarged thyroid noted on exam. Will set her up with ENT for further evaluation. Question if imaging with ultrasound or CT might be helpful. Also question if lisinopril may be causing some angioedema which is contributing to symptoms. Will change to losartan to see if any improvement.

## 2011-12-02 NOTE — Assessment & Plan Note (Signed)
Patient was recently started on Synthroid 50 mcg daily. She continues to feel fatigued. Will recheck TSH with labs today.

## 2011-12-02 NOTE — Addendum Note (Signed)
Addended by: Ronna Polio A on: 12/02/2011 04:40 PM   Modules accepted: Orders

## 2011-12-02 NOTE — Assessment & Plan Note (Signed)
Question if lisinopril may be contributing to difficulty swallowing secondary to angioedema. Will change to losartan.

## 2011-12-02 NOTE — Progress Notes (Signed)
Subjective:    Patient ID: Alice Simmons, female    DOB: 09/10/1955, 56 y.o.   MRN: [id number]  HPI 56 year old female with history of hypothyroidism presents for acute visit complaining of difficulty swallowing. She notices this most at night. She has not had difficulty swallowing food or liquids during the day. She reports some soreness in her throat. She denies any fever, chills, nasal drainage, cough. She has a chronic smoker. She was recently started on Synthroid for hypothyroidism. She reports full compliance with this medication. She continues to complain of significant fatigue and difficulty performing ADLs because of fatigue. She also describes a sensation occasionally that her tongue is heavy.  Outpatient Encounter Prescriptions as of 12/02/2011  Medication Sig Dispense Refill  . alprazolam (XANAX) 2 MG tablet Take 1 tablet (2 mg total) by mouth 2 (two) times daily as needed.  60 tablet  1  . amLODipine (NORVASC) 10 MG tablet Take 1 tablet (10 mg total) by mouth daily.  30 tablet  5  . Bismuth Subsalicylate 525 MG/15ML SUSP Take 15 mLs (525 mg total) by mouth 4 (four) times daily.  600 mL  0  . Eszopiclone (ESZOPICLONE) 3 MG TABS Take 1 tablet (3 mg total) by mouth at bedtime. Take immediately before bedtime  30 tablet  0  . labetalol (NORMODYNE) 300 MG tablet Take 1 tablet (300 mg total) by mouth 2 (two) times daily.  180 tablet  0  . levothyroxine (SYNTHROID) 50 MCG tablet Take 1 tablet (50 mcg total) by mouth daily.  30 tablet  2  . lisinopril (PRINIVIL,ZESTRIL) 40 MG tablet Take 1 tablet (40 mg total) by mouth daily.  90 tablet  1  . omeprazole (PRILOSEC) 20 MG capsule Take 1 capsule (20 mg total) by mouth 2 (two) times daily.  60 capsule  11  . pantoprazole (PROTONIX) 40 MG tablet Take 1 tablet (40 mg total) by mouth 2 (two) times daily.  20 tablet  0  . PARoxetine (PAXIL) 30 MG tablet Take 1 tablet (30 mg total) by mouth 2 (two) times daily.  180 tablet  1  . propranolol (INDERAL)  40 MG tablet Take 1 tablet (40 mg total) by mouth 2 (two) times daily.  60 tablet  5    Review of Systems  Constitutional: Positive for fatigue. Negative for fever, chills, appetite change and unexpected weight change.  HENT: Positive for sore throat and trouble swallowing. Negative for ear pain, congestion, neck pain, voice change and sinus pressure.   Eyes: Negative for visual disturbance.  Respiratory: Negative for cough, shortness of breath, wheezing and stridor.   Cardiovascular: Negative for chest pain, palpitations and leg swelling.  Gastrointestinal: Negative for nausea, vomiting, abdominal pain, diarrhea, constipation, blood in stool, abdominal distention and anal bleeding.  Genitourinary: Negative for dysuria and flank pain.  Musculoskeletal: Negative for myalgias, arthralgias and gait problem.  Skin: Negative for color change and rash.  Neurological: Negative for dizziness and headaches.  Hematological: Negative for adenopathy. Does not bruise/bleed easily.  Psychiatric/Behavioral: Negative for suicidal ideas, disturbed wake/sleep cycle and dysphoric mood. The patient is not nervous/anxious.        Objective:   Physical Exam  Constitutional: She is oriented to person, place, and time. She appears well-developed and well-nourished. No distress.  HENT:  Head: Normocephalic and atraumatic.  Right Ear: External ear normal.  Left Ear: External ear normal.  Nose: Nose normal.  Mouth/Throat: Oropharynx is clear and moist. No oropharyngeal exudate.  Eyes: Conjunctivae are normal.  Pupils are equal, round, and reactive to light. Right eye exhibits no discharge. Left eye exhibits no discharge. No scleral icterus.  Neck: Normal range of motion. Neck supple. No tracheal tenderness present. Carotid bruit is not present. No rigidity. No tracheal deviation, no edema and normal range of motion present. Thyromegaly present. No mass present.  Cardiovascular: Normal rate, regular rhythm, normal  heart sounds and intact distal pulses.  Exam reveals no gallop and no friction rub.   No murmur heard. Pulmonary/Chest: Effort normal and breath sounds normal. No stridor. No respiratory distress. She has no wheezes. She has no rales. She exhibits no tenderness.  Musculoskeletal: Normal range of motion. She exhibits no edema and no tenderness.  Lymphadenopathy:    She has no cervical adenopathy.  Neurological: She is alert and oriented to person, place, and time. No cranial nerve deficit. She exhibits normal muscle tone. Coordination normal.  Skin: Skin is warm and dry. No rash noted. She is not diaphoretic. No erythema. No pallor.  Psychiatric: She has a normal mood and affect. Her behavior is normal. Judgment and thought content normal.          Assessment & Plan:

## 2011-12-03 ENCOUNTER — Other Ambulatory Visit: Payer: Self-pay | Admitting: *Deleted

## 2011-12-03 DIAGNOSIS — F419 Anxiety disorder, unspecified: Secondary | ICD-10-CM

## 2011-12-03 MED ORDER — ALPRAZOLAM 2 MG PO TABS: 2.0000 mg | ORAL_TABLET | Freq: Two times a day (BID) | ORAL | Status: DC | PRN

## 2011-12-03 NOTE — Telephone Encounter (Signed)
Left message on cell phone voicemail for patient to return call. 

## 2011-12-03 NOTE — Telephone Encounter (Signed)
Rx called to [facility]. 

## 2011-12-04 ENCOUNTER — Other Ambulatory Visit: Payer: Self-pay

## 2011-12-04 NOTE — Telephone Encounter (Signed)
Patients spouse advised as instructed via telephone. 

## 2011-12-04 NOTE — Telephone Encounter (Signed)
Left message on cell phone voicemail for patient to return call. 

## 2011-12-09 ENCOUNTER — Ambulatory Visit: Payer: Self-pay | Admitting: Otolaryngology

## 2011-12-18 ENCOUNTER — Other Ambulatory Visit: Payer: Self-pay | Admitting: *Deleted

## 2011-12-18 MED ORDER — PAROXETINE HCL 30 MG PO TABS: 30.0000 mg | ORAL_TABLET | Freq: Two times a day (BID) | ORAL | Status: DC

## 2011-12-25 ENCOUNTER — Other Ambulatory Visit: Payer: Self-pay | Admitting: *Deleted

## 2011-12-25 MED ORDER — ZOLPIDEM TARTRATE 10 MG PO TABS: 10.0000 mg | ORAL_TABLET | Freq: Every evening | ORAL | Status: DC | PRN

## 2011-12-25 NOTE — Telephone Encounter (Signed)
Rx called to [facility]. 

## 2011-12-28 ENCOUNTER — Other Ambulatory Visit: Payer: Self-pay | Admitting: *Deleted

## 2011-12-28 MED ORDER — LABETALOL HCL 300 MG PO TABS: 300.0000 mg | ORAL_TABLET | Freq: Two times a day (BID) | ORAL | Status: DC

## 2012-02-23 ENCOUNTER — Other Ambulatory Visit: Payer: Self-pay | Admitting: *Deleted

## 2012-02-23 MED ORDER — PROPRANOLOL HCL 40 MG PO TABS: 40.0000 mg | ORAL_TABLET | Freq: Two times a day (BID) | ORAL | Status: DC

## 2012-03-02 ENCOUNTER — Encounter: Payer: Self-pay | Admitting: Internal Medicine

## 2012-03-02 ENCOUNTER — Ambulatory Visit (INDEPENDENT_AMBULATORY_CARE_PROVIDER_SITE_OTHER): Payer: Medicare Other | Admitting: Internal Medicine

## 2012-03-02 VITALS — BP 110/64 | HR 65 | Temp 99.1°F | Ht 65.0 in | Wt 203.5 lb

## 2012-03-02 DIAGNOSIS — R1013 Epigastric pain: Secondary | ICD-10-CM | POA: Insufficient documentation

## 2012-03-02 DIAGNOSIS — E039 Hypothyroidism, unspecified: Secondary | ICD-10-CM

## 2012-03-02 DIAGNOSIS — R1310 Dysphagia, unspecified: Secondary | ICD-10-CM

## 2012-03-02 DIAGNOSIS — G4730 Sleep apnea, unspecified: Secondary | ICD-10-CM

## 2012-03-02 DIAGNOSIS — R52 Pain, unspecified: Secondary | ICD-10-CM

## 2012-03-02 LAB — POCT URINALYSIS DIPSTICK
Bilirubin, UA: NEGATIVE
Blood, UA: NEGATIVE
Glucose, UA: NEGATIVE
Nitrite, UA: NEGATIVE
Spec Grav, UA: 1.01
pH, UA: 5.5

## 2012-03-02 LAB — CBC WITH DIFFERENTIAL/PLATELET
Basophils Absolute: 0 10*3/uL (ref 0.0–0.1)
Eosinophils Absolute: 0.2 10*3/uL (ref 0.0–0.7)
Lymphocytes Relative: 25.1 % (ref 12.0–46.0)
MCHC: 33.1 g/dL (ref 30.0–36.0)
Monocytes Relative: 7.3 % (ref 3.0–12.0)
Platelets: 413 10*3/uL — ABNORMAL HIGH (ref 150.0–400.0)
RDW: 14.5 % (ref 11.5–14.6)

## 2012-03-02 LAB — COMPREHENSIVE METABOLIC PANEL
ALT: 61 U/L — ABNORMAL HIGH (ref 0–35)
AST: 46 U/L — ABNORMAL HIGH (ref 0–37)
CO2: 30 mEq/L (ref 19–32)
Calcium: 9.2 mg/dL (ref 8.4–10.5)
Chloride: 103 mEq/L (ref 96–112)
GFR: 65.2 mL/min (ref >60.00)
Sodium: 141 mEq/L (ref 135–145)
Total Bilirubin: 0.4 mg/dL (ref 0.3–1.2)
Total Protein: 7.6 g/dL (ref 6.0–8.3)

## 2012-03-02 LAB — TSH: TSH: 1.25 u[IU]/mL (ref 0.35–5.50)

## 2012-03-02 NOTE — Progress Notes (Signed)
Subjective:    Patient ID: Alice Simmons, female    DOB: May 12, 1956, 56 y.o.   MRN: [id number]  HPI 56 year old female with history of hypothyroidism, hypertension, depression presents for followup. At her last visit, she had complained of difficulty swallowing. She was ultimately evaluated by ENT physician and she reports that evaluation was normal. She reports she had ultrasound of her thyroid which was also normal. She continues to have some episodes of difficulty swallowing associated with increased mucus in the back of her throat. Her husband is also concerned because she seems to have apneic episodes when she is sleeping. She was told by the ENT physician that she would need a sleep study but this has not yet been performed.  She is also concerned today about abdominal pain. She reports abdominal pain in her epigastric area which radiates to her back. She also has some pain on the right side of her abdomen. She denies any diarrhea, constipation, nausea, vomiting, chills. She has had some episodes of subjective fever at home. She notes that she did stop her omeprazole for approximately one week but has recently resumed this medication. She has a history of H. pylori infection but recent endoscopy in May of 2013 showed no evidence of persistent infection.  Outpatient Encounter Prescriptions as of 03/02/2012  Medication Sig Dispense Refill  . alprazolam (XANAX) 2 MG tablet Take 1 tablet (2 mg total) by mouth 2 (two) times daily as needed.  60 tablet  3  . amLODipine (NORVASC) 10 MG tablet Take 1 tablet (10 mg total) by mouth daily.  30 tablet  5  . Bismuth Subsalicylate 525 MG/15ML SUSP Take 15 mLs (525 mg total) by mouth 4 (four) times daily.  600 mL  0  . Eszopiclone (ESZOPICLONE) 3 MG TABS Take 1 tablet (3 mg total) by mouth at bedtime. Take immediately before bedtime  30 tablet  0  . labetalol (NORMODYNE) 300 MG tablet Take 1 tablet (300 mg total) by mouth 2 (two) times daily.  180 tablet  0    . levothyroxine (SYNTHROID, LEVOTHROID) 100 MCG tablet Take 1 tablet (100 mcg total) by mouth daily.  90 tablet  3  . losartan (COZAAR) 50 MG tablet Take 1 tablet (50 mg total) by mouth daily.  30 tablet  3  . omeprazole (PRILOSEC) 20 MG capsule Take 1 capsule (20 mg total) by mouth 2 (two) times daily.  60 capsule  11  . PARoxetine (PAXIL) 30 MG tablet Take 1 tablet (30 mg total) by mouth 2 (two) times daily.  180 tablet  3  . propranolol (INDERAL) 40 MG tablet Take 1 tablet (40 mg total) by mouth 2 (two) times daily.  60 tablet  5  . zolpidem (AMBIEN) 10 MG tablet Take 1 tablet (10 mg total) by mouth at bedtime as needed.  30 tablet  3  . DISCONTD: pantoprazole (PROTONIX) 40 MG tablet Take 1 tablet (40 mg total) by mouth 2 (two) times daily.  20 tablet  0   BP 110/64  Pulse 65  Temp 99.1 F (37.3 C) (Oral)  Ht 5\' 5"  (1.651 m)  Wt 203 lb 8 oz (92.307 kg)  BMI 33.86 kg/m2  SpO2 95%  Review of Systems  Constitutional: Positive for fever and fatigue. Negative for chills, appetite change and unexpected weight change.  HENT: Positive for trouble swallowing and postnasal drip. Negative for ear pain, congestion, sore throat, neck pain, voice change and sinus pressure.   Eyes: Negative for visual disturbance.  Respiratory: Positive for cough. Negative for shortness of breath, wheezing and stridor.   Cardiovascular: Negative for chest pain, palpitations and leg swelling.  Gastrointestinal: Positive for abdominal pain. Negative for nausea, vomiting, diarrhea, constipation, blood in stool, abdominal distention and anal bleeding.  Genitourinary: Negative for dysuria and flank pain.  Musculoskeletal: Negative for myalgias, arthralgias and gait problem.  Skin: Negative for color change and rash.  Neurological: Negative for dizziness and headaches.  Hematological: Negative for adenopathy. Does not bruise/bleed easily.  Psychiatric/Behavioral: Negative for suicidal ideas, disturbed wake/sleep cycle  and dysphoric mood. The patient is not nervous/anxious.        Objective:   Physical Exam  Constitutional: She is oriented to person, place, and time. She appears well-developed and well-nourished. No distress.  HENT:  Head: Normocephalic and atraumatic.  Right Ear: External ear normal.  Left Ear: External ear normal.  Nose: Nose normal.  Mouth/Throat: Oropharynx is clear and moist. No oropharyngeal exudate.  Eyes: Conjunctivae normal are normal. Pupils are equal, round, and reactive to light. Right eye exhibits no discharge. Left eye exhibits no discharge. No scleral icterus.  Neck: Normal range of motion. Neck supple. No tracheal deviation present. No thyromegaly present.  Cardiovascular: Normal rate, regular rhythm, normal heart sounds and intact distal pulses.  Exam reveals no gallop and no friction rub.   No murmur heard. Pulmonary/Chest: Effort normal and breath sounds normal. No respiratory distress. She has no wheezes. She has no rales. She exhibits no tenderness.  Abdominal: Soft. Bowel sounds are normal. She exhibits no mass. There is tenderness (right mid abdomen). There is no rebound and no guarding.  Musculoskeletal: Normal range of motion. She exhibits no edema and no tenderness.  Lymphadenopathy:    She has no cervical adenopathy.  Neurological: She is alert and oriented to person, place, and time. No cranial nerve deficit. She exhibits normal muscle tone. Coordination normal.  Skin: Skin is warm and dry. No rash noted. She is not diaphoretic. No erythema. No pallor.  Psychiatric: She has a normal mood and affect. Her behavior is normal. Judgment and thought content normal.          Assessment & Plan:

## 2012-03-02 NOTE — Assessment & Plan Note (Signed)
Will get records from ENT.

## 2012-03-02 NOTE — Assessment & Plan Note (Signed)
Will repeat TSH with labs today. Continue Synthroid. 

## 2012-03-02 NOTE — Assessment & Plan Note (Signed)
Patient having acute epigastric discomfort which radiates to her back. Suspect secondary to her stopping her omeprazole over the last week. She has resumed omeprazole and we'll have her increase to twice daily. Urinalysis is normal today. Given that she has pain radiating to her right lower quadrant and back with fever, question if she may have ongoing colitis or appendicitis. Will get CT of the abdomen for further evaluation.

## 2012-03-02 NOTE — Assessment & Plan Note (Signed)
Patient having apneic episodes during sleep which are consistent with obstructive sleep apnea. Will set up sleep study as soon as possible.

## 2012-03-11 ENCOUNTER — Telehealth: Payer: Self-pay | Admitting: Internal Medicine

## 2012-03-11 DIAGNOSIS — N200 Calculus of kidney: Secondary | ICD-10-CM

## 2012-03-11 NOTE — Telephone Encounter (Signed)
CT abdomen showed bilateral renal stones. Recommend referral to urology.

## 2012-03-16 NOTE — Telephone Encounter (Signed)
Left message on cell phone voicemail for patient to return call. 

## 2012-03-17 NOTE — Telephone Encounter (Signed)
Patient advised as instructed via telephone, he stated that he would like [place] to set up referral for Urology and he stated that Dr. Dan Humphreys was suppose to set him up for a sleep study.  Please advise.

## 2012-03-17 NOTE — Telephone Encounter (Signed)
I will ask Erie Noe for assistance in setting up Urology appt and sleep study.

## 2012-03-17 NOTE — Telephone Encounter (Signed)
Fine to set up referral to urology for her and sleep study for him.

## 2012-03-21 ENCOUNTER — Telehealth: Payer: Self-pay | Admitting: Internal Medicine

## 2012-03-21 NOTE — Telephone Encounter (Signed)
Pt is needing pain meds for stones .She uses [facility] on [address]

## 2012-03-22 ENCOUNTER — Encounter: Payer: Self-pay | Admitting: Internal Medicine

## 2012-03-22 NOTE — Telephone Encounter (Signed)
Must be seen

## 2012-03-22 NOTE — Telephone Encounter (Signed)
Spoke with patients spouse and he stated that patient is complaining of nausea, vomiting, and side pain.  This has been going on for 3 day now.  She is requesting pain medication.  Please advise.

## 2012-03-23 ENCOUNTER — Telehealth: Payer: Self-pay | Admitting: Internal Medicine

## 2012-03-23 DIAGNOSIS — F419 Anxiety disorder, unspecified: Secondary | ICD-10-CM

## 2012-03-23 NOTE — Telephone Encounter (Signed)
Fine to refill 

## 2012-03-23 NOTE — Telephone Encounter (Signed)
Patients spouse advised via telephone, patient has an appt with Dr. Dan Humphreys on Friday.

## 2012-03-23 NOTE — Telephone Encounter (Signed)
Refill request for controlled substance Alprazolam 2 mg twice daily as needed

## 2012-03-25 ENCOUNTER — Encounter: Payer: Self-pay | Admitting: Internal Medicine

## 2012-03-25 ENCOUNTER — Ambulatory Visit (INDEPENDENT_AMBULATORY_CARE_PROVIDER_SITE_OTHER): Payer: Medicare Other | Admitting: Internal Medicine

## 2012-03-25 VITALS — BP 160/80 | HR 67 | Temp 98.3°F | Ht 65.0 in | Wt 205.5 lb

## 2012-03-25 DIAGNOSIS — R52 Pain, unspecified: Secondary | ICD-10-CM

## 2012-03-25 DIAGNOSIS — N200 Calculus of kidney: Secondary | ICD-10-CM | POA: Insufficient documentation

## 2012-03-25 DIAGNOSIS — R109 Unspecified abdominal pain: Secondary | ICD-10-CM

## 2012-03-25 DIAGNOSIS — E669 Obesity, unspecified: Secondary | ICD-10-CM

## 2012-03-25 LAB — POCT URINALYSIS DIPSTICK
Blood, UA: NEGATIVE
Glucose, UA: NEGATIVE
Ketones, UA: NEGATIVE
Spec Grav, UA: 1.01
Urobilinogen, UA: 0.2

## 2012-03-25 MED ORDER — HYDROCODONE-ACETAMINOPHEN 5-500 MG PO TABS: 1.0000 | ORAL_TABLET | Freq: Four times a day (QID) | ORAL | Status: DC | PRN

## 2012-03-25 MED ORDER — CIPROFLOXACIN HCL 500 MG PO TABS: 500.0000 mg | ORAL_TABLET | Freq: Two times a day (BID) | ORAL | Status: DC

## 2012-03-25 MED ORDER — ALPRAZOLAM 2 MG PO TABS: 2.0000 mg | ORAL_TABLET | Freq: Two times a day (BID) | ORAL | Status: DC | PRN

## 2012-03-25 NOTE — Telephone Encounter (Signed)
Rx called to [facility]. 

## 2012-03-25 NOTE — Assessment & Plan Note (Signed)
BMI 34. Patient remains very concerned about ongoing weight gain. Per her husband's report, she has been taking some over-the-counter supplements. Encouraged her to avoid use of supplemental medications that she has elevated liver function tests. She has been seen by both endocrinology and nutrition and evaluation for obesity in the past and did not find this helpful. Gave her handout on low glycemic index diet. Followup in one week after acute issues with flank pain are resolved.

## 2012-03-25 NOTE — Assessment & Plan Note (Signed)
Patient with acute bilateral flank pain, chills, fever at home, dysuria all consistent with urinary tract infection and nephrolithiasis. CT performed at her last visit confirmed presence of bilateral nephrolithiasis. She has been scheduled to see urology today. We'll also send urine for culture and start empiric therapy with Cipro. Will start Vicodin prn pain. She will followup here in one week.

## 2012-03-25 NOTE — Progress Notes (Signed)
Subjective:    Patient ID: Alice Simmons, female    DOB: July 27, 1955, 56 y.o.   MRN: [id number]  HPI 56 year old female with history of hypertension, depression, anxiety, obesity, hypothyroidism, and recent episodes of bilateral flank pain presents for followup. At her last visit, we obtained a CT of the abdomen which showed bilateral kidney stones. Attempt was made to set her up with urology but previous appointment time was not convenient. Patient reports severe bilateral flank pain, fatigue, fever, chills, and headache. She also reports some episodes of dizziness. She reports that her urine has a foul smell. She denies gross hematuria. She is not currently taking any medication for this.  She is also concerned today about ongoing issues with weight gain. Over the last couple of years her weight has gradually increased. She reports that she follows a healthy diet. She gets occasional physical activity with walking. She has been evaluated both by endocrinology and nutritionist to help assist her with weight loss. No other underlying cause of weight gain has been found. She has been taking some over-the-counter supplemental medications to help with weight loss without any improvement.  Outpatient Encounter Prescriptions as of 03/25/2012  Medication Sig Dispense Refill  . alprazolam (XANAX) 2 MG tablet Take 1 tablet (2 mg total) by mouth 2 (two) times daily as needed.  60 tablet  3  . amLODipine (NORVASC) 10 MG tablet Take 1 tablet (10 mg total) by mouth daily.  30 tablet  5  . Bismuth Subsalicylate 525 MG/15ML SUSP Take 15 mLs (525 mg total) by mouth 4 (four) times daily.  600 mL  0  . Eszopiclone (ESZOPICLONE) 3 MG TABS Take 1 tablet (3 mg total) by mouth at bedtime. Take immediately before bedtime  30 tablet  0  . labetalol (NORMODYNE) 300 MG tablet Take 1 tablet (300 mg total) by mouth 2 (two) times daily.  180 tablet  0  . levothyroxine (SYNTHROID, LEVOTHROID) 100 MCG tablet Take 1 tablet (100  mcg total) by mouth daily.  90 tablet  3  . losartan (COZAAR) 50 MG tablet Take 1 tablet (50 mg total) by mouth daily.  30 tablet  3  . omeprazole (PRILOSEC) 20 MG capsule Take 1 capsule (20 mg total) by mouth 2 (two) times daily.  60 capsule  11  . PARoxetine (PAXIL) 30 MG tablet Take 1 tablet (30 mg total) by mouth 2 (two) times daily.  180 tablet  3  . propranolol (INDERAL) 40 MG tablet Take 1 tablet (40 mg total) by mouth 2 (two) times daily.  60 tablet  5  . zolpidem (AMBIEN) 10 MG tablet Take 1 tablet (10 mg total) by mouth at bedtime as needed.  30 tablet  3  . ciprofloxacin (CIPRO) 500 MG tablet Take 1 tablet (500 mg total) by mouth 2 (two) times daily.  28 tablet  0  . HYDROcodone-acetaminophen (VICODIN) 5-500 MG per tablet Take 1 tablet by mouth every 6 (six) hours as needed for pain.  60 tablet  0   BP 160/80  Pulse 67  Temp 98.3 F (36.8 C) (Oral)  Ht 5\' 5"  (1.651 m)  Wt 205 lb 8 oz (93.214 kg)  BMI 34.20 kg/m2  SpO2 94%  Review of Systems  Constitutional: Positive for fever, chills and fatigue. Negative for appetite change and unexpected weight change.  HENT: Negative for ear pain, congestion, sore throat, trouble swallowing, neck pain, voice change and sinus pressure.   Eyes: Negative for visual disturbance.  Respiratory: Negative  for cough, shortness of breath, wheezing and stridor.   Cardiovascular: Negative for chest pain, palpitations and leg swelling.  Gastrointestinal: Negative for nausea, vomiting, abdominal pain, diarrhea, constipation, blood in stool, abdominal distention and anal bleeding.  Genitourinary: Positive for dysuria and flank pain. Negative for urgency and hematuria.  Musculoskeletal: Negative for myalgias, arthralgias and gait problem.  Skin: Negative for color change and rash.  Neurological: Negative for dizziness and headaches.  Hematological: Negative for adenopathy. Does not bruise/bleed easily.  Psychiatric/Behavioral: Positive for dysphoric mood.  Negative for suicidal ideas and disturbed wake/sleep cycle. The patient is nervous/anxious.        Objective:   Physical Exam  Constitutional: She is oriented to person, place, and time. She appears well-developed and well-nourished. No distress.  HENT:  Head: Normocephalic and atraumatic.  Right Ear: External ear normal.  Left Ear: External ear normal.  Nose: Nose normal.  Mouth/Throat: Oropharynx is clear and moist. No oropharyngeal exudate.  Eyes: Conjunctivae normal are normal. Pupils are equal, round, and reactive to light. Right eye exhibits no discharge. Left eye exhibits no discharge. No scleral icterus.  Neck: Normal range of motion. Neck supple. No tracheal deviation present. No thyromegaly present.  Cardiovascular: Normal rate, regular rhythm, normal heart sounds and intact distal pulses.  Exam reveals no gallop and no friction rub.   No murmur heard. Pulmonary/Chest: Effort normal and breath sounds normal. No respiratory distress. She has no wheezes. She has no rales. She exhibits no tenderness.  Abdominal: Soft. Bowel sounds are normal. She exhibits no distension and no mass. There is tenderness (bilateral flank). There is no rebound and no guarding.  Musculoskeletal: Normal range of motion. She exhibits no edema and no tenderness.  Lymphadenopathy:    She has no cervical adenopathy.  Neurological: She is alert and oriented to person, place, and time. No cranial nerve deficit. She exhibits normal muscle tone. Coordination normal.  Skin: Skin is warm and dry. No rash noted. She is not diaphoretic. No erythema. No pallor.  Psychiatric: She has a normal mood and affect. Her behavior is normal. Judgment and thought content normal.          Assessment & Plan:

## 2012-03-25 NOTE — Assessment & Plan Note (Signed)
Bilateral nephrolithiasis seen on CT of the abdomen. Symptomatic with bilateral flank pain and symptoms suggestive of urinary tract infection. Urology evaluation scheduled for today. Treating empirically with cipro.

## 2012-03-27 LAB — CULTURE, URINE COMPREHENSIVE
Colony Count: NO GROWTH
Organism ID, Bacteria: NO GROWTH

## 2012-03-28 ENCOUNTER — Emergency Department: Payer: Self-pay | Admitting: Emergency Medicine

## 2012-03-28 LAB — URINALYSIS, COMPLETE
Bacteria: NONE SEEN
Leukocyte Esterase: NEGATIVE
Ph: 5 (ref 4.5–8.0)
Protein: NEGATIVE
RBC,UR: NONE SEEN /HPF (ref 0–5)

## 2012-03-29 ENCOUNTER — Telehealth: Payer: Self-pay | Admitting: Internal Medicine

## 2012-03-29 NOTE — Telephone Encounter (Signed)
Losartan 50 mg tablets Sig: take 1 tablet by mouth every day

## 2012-03-29 NOTE — Telephone Encounter (Signed)
Labetalol 300 mg tablets Sig: take 1 tablet by mouth twice daily

## 2012-03-30 ENCOUNTER — Encounter: Payer: Self-pay | Admitting: Internal Medicine

## 2012-03-30 ENCOUNTER — Ambulatory Visit (INDEPENDENT_AMBULATORY_CARE_PROVIDER_SITE_OTHER): Payer: Medicare Other | Admitting: Internal Medicine

## 2012-03-30 VITALS — BP 110/70 | HR 68 | Temp 98.6°F | Ht 66.0 in | Wt 206.0 lb

## 2012-03-30 DIAGNOSIS — R42 Dizziness and giddiness: Secondary | ICD-10-CM

## 2012-03-30 DIAGNOSIS — F329 Major depressive disorder, single episode, unspecified: Secondary | ICD-10-CM

## 2012-03-30 DIAGNOSIS — R635 Abnormal weight gain: Secondary | ICD-10-CM

## 2012-03-30 DIAGNOSIS — M545 Low back pain: Secondary | ICD-10-CM

## 2012-03-30 MED ORDER — LABETALOL HCL 300 MG PO TABS: 300.0000 mg | ORAL_TABLET | Freq: Two times a day (BID) | ORAL | Status: DC

## 2012-03-30 MED ORDER — LOSARTAN POTASSIUM 50 MG PO TABS: 50.0000 mg | ORAL_TABLET | Freq: Every day | ORAL | Status: DC

## 2012-03-30 NOTE — Telephone Encounter (Signed)
rx sent to pharmacy by e-script  

## 2012-03-30 NOTE — Assessment & Plan Note (Signed)
Patient having ongoing issues with dizziness described as vertigo. Occasionally has blurred vision. Question if there may be central reason for the symptoms and ongoing weight gain. Will get MRI brain for further evaluation.

## 2012-03-30 NOTE — Addendum Note (Signed)
Addended by: Sueanne Margarita on: 03/30/2012 03:34 PM   Modules accepted: Orders

## 2012-03-30 NOTE — Assessment & Plan Note (Signed)
Patient with remarkable weight gain of over 30 pounds within the last year. Reports that she is following a healthy diet. Physical activity is limited by ongoing issues with pain in her lower back. Question if there may be some secondary cause of weight gain. Recent thyroid function was normal. Will send urine 24-hour cortisol level to evaluate for Cushing's. We'll also get MRI brain given ongoing issues with headaches, dizziness and weight gain.

## 2012-03-30 NOTE — Assessment & Plan Note (Signed)
Depression is currently poorly controlled with medications. Patient is tearful describing ongoing issues with weight gain and pain. We're addressing knees as described. Offered support today. We'll have her return to clinic in 2 weeks. If evaluation for dizziness and weight gain unrevealing, consider psychiatry referral for further evaluation.

## 2012-03-30 NOTE — Progress Notes (Signed)
Subjective:    Patient ID: Alice Simmons, female    DOB: Feb 05, 1956, 56 y.o.   MRN: [id number]  HPI 56 year old female with history of hypertension, depression, nephrolithiasis, hypothyroidism presents for followup after recent episode of bilateral flank pain. She was seen by urology and had evaluation including CT of the abdomen which showed bilateral nephrolithiasis. However, given the size of the stones urology felt that this was not the cause of her flank pain. No other abnormalities were seen on her CT scan. She reports persistent pain across her mid back.   She is tearful today and extremely upset describing her ongoing weight gain. Over the last 2 years she has gained over 30 pounds. She reports she has been following a healthy diet and limiting intake of calories. She has not been active because of ongoing issues with pain in her knees and back. She has been taking numerous over-the-counter diet medications including green tea extract with no improvement in weight gain. She feels that there must be some other cause of her weight gain.  She is also concerned today about ongoing issues with diffuse headaches and dizziness. She reports is gradually getting worse. Dizziness is described as vertigo. She also has heaviness sensation on the right side of her head. She denies any trauma to her head. She occasionally has some blurred vision particularly in her right eye. She has not taken any medication specifically for headache pain.  Outpatient Encounter Prescriptions as of 03/30/2012  Medication Sig Dispense Refill  . alprazolam (XANAX) 2 MG tablet Take 1 tablet (2 mg total) by mouth 2 (two) times daily as needed.  60 tablet  1  . amLODipine (NORVASC) 10 MG tablet Take 1 tablet (10 mg total) by mouth daily.  30 tablet  5  . Bismuth Subsalicylate 525 MG/15ML SUSP Take 15 mLs (525 mg total) by mouth 4 (four) times daily.  600 mL  0  . ciprofloxacin (CIPRO) 500 MG tablet Take 1 tablet (500 mg total)  by mouth 2 (two) times daily.  28 tablet  0  . Eszopiclone (ESZOPICLONE) 3 MG TABS Take 1 tablet (3 mg total) by mouth at bedtime. Take immediately before bedtime  30 tablet  0  . HYDROcodone-acetaminophen (VICODIN) 5-500 MG per tablet Take 1 tablet by mouth every 6 (six) hours as needed for pain.  60 tablet  0  . levothyroxine (SYNTHROID, LEVOTHROID) 100 MCG tablet Take 1 tablet (100 mcg total) by mouth daily.  90 tablet  3  . omeprazole (PRILOSEC) 20 MG capsule Take 1 capsule (20 mg total) by mouth 2 (two) times daily.  60 capsule  11  . PARoxetine (PAXIL) 30 MG tablet Take 1 tablet (30 mg total) by mouth 2 (two) times daily.  180 tablet  3  . propranolol (INDERAL) 40 MG tablet Take 1 tablet (40 mg total) by mouth 2 (two) times daily.  60 tablet  5  . zolpidem (AMBIEN) 10 MG tablet Take 1 tablet (10 mg total) by mouth at bedtime as needed.  30 tablet  3  . DISCONTD: labetalol (NORMODYNE) 300 MG tablet Take 1 tablet (300 mg total) by mouth 2 (two) times daily.  180 tablet  0  . DISCONTD: losartan (COZAAR) 50 MG tablet Take 1 tablet (50 mg total) by mouth daily.  30 tablet  3   BP 110/70  Pulse 68  Temp 98.6 F (37 C) (Oral)  Ht 5\' 6"  (1.676 m)  Wt 206 lb (93.441 kg)  BMI 33.25 kg/m2  SpO2 93%  Review of Systems  Constitutional: Positive for fatigue and unexpected weight change. Negative for fever, chills and appetite change.  HENT: Negative for ear pain, congestion, sore throat, trouble swallowing, neck pain, voice change and sinus pressure.   Eyes: Negative for visual disturbance.  Respiratory: Negative for cough, shortness of breath, wheezing and stridor.   Cardiovascular: Negative for chest pain, palpitations and leg swelling.  Gastrointestinal: Negative for nausea, vomiting, abdominal pain, diarrhea, constipation, blood in stool, abdominal distention and anal bleeding.  Genitourinary: Positive for flank pain. Negative for dysuria, urgency, frequency, genital sores and pelvic pain.    Musculoskeletal: Negative for myalgias, arthralgias and gait problem.  Skin: Negative for color change and rash.  Neurological: Negative for dizziness and headaches.  Hematological: Negative for adenopathy. Does not bruise/bleed easily.  Psychiatric/Behavioral: Positive for behavioral problems, disturbed wake/sleep cycle and dysphoric mood. Negative for suicidal ideas. The patient is nervous/anxious.        Objective:   Physical Exam  Constitutional: She is oriented to person, place, and time. She appears well-developed and well-nourished. No distress.  HENT:  Head: Normocephalic and atraumatic.  Right Ear: External ear normal.  Left Ear: External ear normal.  Nose: Nose normal.  Mouth/Throat: Oropharynx is clear and moist. No oropharyngeal exudate.  Eyes: Conjunctivae normal are normal. Pupils are equal, round, and reactive to light. Right eye exhibits no discharge. Left eye exhibits no discharge. No scleral icterus.  Neck: Normal range of motion. Neck supple. No tracheal deviation present. No thyromegaly present.  Cardiovascular: Normal rate, regular rhythm, normal heart sounds and intact distal pulses.  Exam reveals no gallop and no friction rub.   No murmur heard. Pulmonary/Chest: Effort normal and breath sounds normal. No respiratory distress. She has no wheezes. She has no rales. She exhibits no tenderness.  Musculoskeletal: Normal range of motion. She exhibits no edema and no tenderness.       Lumbar back: She exhibits tenderness, pain and spasm.  Lymphadenopathy:    She has no cervical adenopathy.  Neurological: She is alert and oriented to person, place, and time. No cranial nerve deficit. She exhibits normal muscle tone. Coordination normal.  Skin: Skin is warm and dry. No rash noted. She is not diaphoretic. No erythema. No pallor.  Psychiatric: Her speech is normal and behavior is normal. Judgment and thought content normal. Her mood appears anxious. Cognition and memory are  normal. She exhibits a depressed mood.          Assessment & Plan:

## 2012-03-30 NOTE — Assessment & Plan Note (Signed)
Patient with bilateral flank pain. She does have nephrolithiasis however urology feels that these are not source of pain. Pain is improved but not obese ED with hydrocodone. No obvious source of pain on CT abdomen. Will set up orthopedic evaluation. Question if MRI lumbar spine might be helpful for further evaluation.

## 2012-04-05 ENCOUNTER — Ambulatory Visit: Payer: Self-pay | Admitting: Internal Medicine

## 2012-04-05 ENCOUNTER — Other Ambulatory Visit: Payer: Self-pay

## 2012-04-05 LAB — CREATININE, SERUM: Creatinine: 0.78 mg/dL (ref 0.60–1.30)

## 2012-04-08 ENCOUNTER — Ambulatory Visit: Payer: Self-pay | Admitting: Physical Medicine and Rehabilitation

## 2012-04-10 ENCOUNTER — Telehealth: Payer: Self-pay | Admitting: Internal Medicine

## 2012-04-10 NOTE — Telephone Encounter (Signed)
MRI Brain was normal.

## 2012-04-11 LAB — CORTISOL, URINE, 24 HOUR: Cortisol (Ur), Free: 6 mcg/24 h (ref 4.0–50.0)

## 2012-04-13 ENCOUNTER — Other Ambulatory Visit: Payer: Self-pay | Admitting: Internal Medicine

## 2012-04-13 MED ORDER — AMLODIPINE BESYLATE 10 MG PO TABS: 10.0000 mg | ORAL_TABLET | Freq: Every day | ORAL | Status: DC

## 2012-04-13 NOTE — Telephone Encounter (Signed)
Rx for Zolpidem called to [facility] pharmacy.

## 2012-04-13 NOTE — Telephone Encounter (Signed)
Amlodipine besylate 10 mg tablets Sig: take 1 tablet by mouth every day Qty:30

## 2012-04-13 NOTE — Addendum Note (Signed)
Addended by: Gilmer Mor on: 04/13/2012 03:23 PM   Modules accepted: Orders

## 2012-04-21 ENCOUNTER — Encounter: Payer: Self-pay | Admitting: Internal Medicine

## 2012-04-22 ENCOUNTER — Encounter: Payer: Self-pay | Admitting: Internal Medicine

## 2012-04-27 ENCOUNTER — Ambulatory Visit (INDEPENDENT_AMBULATORY_CARE_PROVIDER_SITE_OTHER)
Admission: RE | Admit: 2012-04-27 | Discharge: 2012-04-27 | Disposition: A | Discharge status: Home / Self Care | Payer: Medicare Other | Source: Ambulatory Visit | Attending: Internal Medicine | Admitting: Internal Medicine

## 2012-04-27 ENCOUNTER — Ambulatory Visit (INDEPENDENT_AMBULATORY_CARE_PROVIDER_SITE_OTHER): Payer: Medicare Other | Admitting: Internal Medicine

## 2012-04-27 ENCOUNTER — Encounter: Payer: Self-pay | Admitting: Internal Medicine

## 2012-04-27 VITALS — BP 118/70 | HR 63 | Temp 98.1°F | Ht 66.0 in | Wt 202.2 lb

## 2012-04-27 DIAGNOSIS — E669 Obesity, unspecified: Secondary | ICD-10-CM

## 2012-04-27 DIAGNOSIS — M25579 Pain in unspecified ankle and joints of unspecified foot: Secondary | ICD-10-CM

## 2012-04-27 DIAGNOSIS — M25572 Pain in left ankle and joints of left foot: Secondary | ICD-10-CM

## 2012-04-27 NOTE — Assessment & Plan Note (Signed)
Congratulated patient on weight loss. Encouraged her to continue efforts at reducing intake of processed carbohydrates. Also encouraged her to increase physical activity when she is able to tolerate this after acute issues with ankle resolve.

## 2012-04-27 NOTE — Progress Notes (Signed)
Subjective:    Patient ID: Alice Simmons, female    DOB: 10-09-1955, 56 y.o.   MRN: [id number]  HPI 56 year old female with history of depression, hypertension, and obesity presents for followup. At her last visit, there was some concern about progressive weight gain. Patient was concerned about underlying endocrine cause of weight gain. She ultimately had urine cortisol testing and MRI brain which were both normal. Thyroid testing was also recently normal. Over the last month, she has adopted a lower carbohydrate diet. She has lost 4 pounds.  She is concerned today about left lateral ankle pain. She reports that she twisted her ankle and fell onto the floor about one week ago. She has had difficulty walking since that time. She describes pain and swelling over her left lateral ankle. She has not been taking any medication for this.  Outpatient Encounter Prescriptions as of 04/27/2012  Medication Sig Dispense Refill  . alprazolam (XANAX) 2 MG tablet Take 1 tablet (2 mg total) by mouth 2 (two) times daily as needed.  60 tablet  1  . amLODipine (NORVASC) 10 MG tablet Take 1 tablet (10 mg total) by mouth daily.  30 tablet  5  . Bismuth Subsalicylate 525 MG/15ML SUSP Take 15 mLs (525 mg total) by mouth 4 (four) times daily.  600 mL  0  . Eszopiclone (ESZOPICLONE) 3 MG TABS Take 1 tablet (3 mg total) by mouth at bedtime. Take immediately before bedtime  30 tablet  0  . HYDROcodone-acetaminophen (VICODIN) 5-500 MG per tablet Take 1 tablet by mouth every 6 (six) hours as needed for pain.  60 tablet  0  . labetalol (NORMODYNE) 300 MG tablet Take 1 tablet (300 mg total) by mouth 2 (two) times daily.  180 tablet  0  . levothyroxine (SYNTHROID, LEVOTHROID) 100 MCG tablet Take 1 tablet (100 mcg total) by mouth daily.  90 tablet  3  . losartan (COZAAR) 50 MG tablet Take 1 tablet (50 mg total) by mouth daily.  30 tablet  3  . omeprazole (PRILOSEC) 20 MG capsule Take 1 capsule (20 mg total) by mouth 2 (two)  times daily.  60 capsule  11  . PARoxetine (PAXIL) 30 MG tablet Take 1 tablet (30 mg total) by mouth 2 (two) times daily.  180 tablet  3  . propranolol (INDERAL) 40 MG tablet Take 1 tablet (40 mg total) by mouth 2 (two) times daily.  60 tablet  5  . zolpidem (AMBIEN) 10 MG tablet TAKE 1 TABLET AT BEDTIME AS NEEDED  30 tablet  0  . [DISCONTINUED] ciprofloxacin (CIPRO) 500 MG tablet Take 1 tablet (500 mg total) by mouth 2 (two) times daily.  28 tablet  0    Review of Systems  Constitutional: Negative for fever, chills, appetite change, fatigue and unexpected weight change.  HENT: Negative for ear pain, congestion, sore throat, trouble swallowing, neck pain, voice change and sinus pressure.   Eyes: Negative for visual disturbance.  Respiratory: Negative for cough, shortness of breath, wheezing and stridor.   Cardiovascular: Negative for chest pain, palpitations and leg swelling.  Gastrointestinal: Negative for nausea, vomiting, abdominal pain, diarrhea, constipation, blood in stool, abdominal distention and anal bleeding.  Genitourinary: Negative for dysuria and flank pain.  Musculoskeletal: Positive for myalgias, back pain, joint swelling and arthralgias. Negative for gait problem.  Skin: Negative for color change and rash.  Neurological: Negative for dizziness and headaches.  Hematological: Negative for adenopathy. Does not bruise/bleed easily.  Psychiatric/Behavioral: Negative for suicidal ideas,  sleep disturbance and dysphoric mood. The patient is not nervous/anxious.        Objective:   Physical Exam  Constitutional: She is oriented to person, place, and time. She appears well-developed and well-nourished. No distress.  HENT:  Head: Normocephalic and atraumatic.  Right Ear: External ear normal.  Left Ear: External ear normal.  Nose: Nose normal.  Mouth/Throat: Oropharynx is clear and moist. No oropharyngeal exudate.  Eyes: Conjunctivae normal are normal. Pupils are equal, round, and  reactive to light. Right eye exhibits no discharge. Left eye exhibits no discharge. No scleral icterus.  Neck: Normal range of motion. Neck supple. No tracheal deviation present. No thyromegaly present.  Cardiovascular: Normal rate, regular rhythm, normal heart sounds and intact distal pulses.  Exam reveals no gallop and no friction rub.   No murmur heard. Pulmonary/Chest: Effort normal and breath sounds normal. No respiratory distress. She has no wheezes. She has no rales. She exhibits no tenderness.  Musculoskeletal: She exhibits no edema and no tenderness.       Left ankle: She exhibits decreased range of motion and swelling. tenderness. Lateral malleolus tenderness found.  Lymphadenopathy:    She has no cervical adenopathy.  Neurological: She is alert and oriented to person, place, and time. No cranial nerve deficit. She exhibits normal muscle tone. Coordination normal.  Skin: Skin is warm and dry. No rash noted. She is not diaphoretic. No erythema. No pallor.  Psychiatric: She has a normal mood and affect. Her behavior is normal. Judgment and thought content normal.          Assessment & Plan:

## 2012-04-27 NOTE — Assessment & Plan Note (Signed)
Left ankle pain is concerning for fracture given tenderness over the lateral malleolus. Will get plain x-ray for evaluation.

## 2012-05-11 ENCOUNTER — Other Ambulatory Visit: Payer: Self-pay | Admitting: General Practice

## 2012-05-11 MED ORDER — LABETALOL HCL 300 MG PO TABS: 300.0000 mg | ORAL_TABLET | Freq: Two times a day (BID) | ORAL | Status: DC

## 2012-05-11 NOTE — Telephone Encounter (Signed)
Med filled.  

## 2012-05-16 ENCOUNTER — Telehealth: Payer: Self-pay | Admitting: General Practice

## 2012-05-16 MED ORDER — ZOLPIDEM TARTRATE 10 MG PO TABS: ORAL_TABLET | ORAL | Status: DC

## 2012-05-16 NOTE — Telephone Encounter (Signed)
Ambien refill request. Last filled on 10/30. Pt last seen on 11/13.

## 2012-05-16 NOTE — Telephone Encounter (Signed)
Med phoned in °

## 2012-05-16 NOTE — Telephone Encounter (Signed)
Fine to refill x30 with 3 refills.

## 2012-05-23 ENCOUNTER — Other Ambulatory Visit: Payer: Self-pay | Admitting: General Practice

## 2012-05-23 DIAGNOSIS — F419 Anxiety disorder, unspecified: Secondary | ICD-10-CM

## 2012-05-23 MED ORDER — ALPRAZOLAM 2 MG PO TABS: 2.0000 mg | ORAL_TABLET | Freq: Two times a day (BID) | ORAL | Status: DC | PRN

## 2012-05-23 NOTE — Telephone Encounter (Signed)
Fine to refill x 3 months. 

## 2012-05-23 NOTE — Telephone Encounter (Signed)
XANAX refill request. Med last filled on 03/24/12. Pt last seen on 11/13. Ok to refill?

## 2012-05-23 NOTE — Telephone Encounter (Signed)
Med phoned in °

## 2012-05-26 ENCOUNTER — Encounter: Payer: Self-pay | Admitting: Internal Medicine

## 2012-05-26 ENCOUNTER — Ambulatory Visit (INDEPENDENT_AMBULATORY_CARE_PROVIDER_SITE_OTHER): Payer: Medicare Other | Admitting: Internal Medicine

## 2012-05-26 VITALS — BP 122/72 | HR 69 | Temp 98.2°F | Resp 15

## 2012-05-26 DIAGNOSIS — M62830 Muscle spasm of back: Secondary | ICD-10-CM | POA: Insufficient documentation

## 2012-05-26 DIAGNOSIS — S92902A Unspecified fracture of left foot, initial encounter for closed fracture: Secondary | ICD-10-CM

## 2012-05-26 DIAGNOSIS — F329 Major depressive disorder, single episode, unspecified: Secondary | ICD-10-CM

## 2012-05-26 DIAGNOSIS — S92909A Unspecified fracture of unspecified foot, initial encounter for closed fracture: Secondary | ICD-10-CM

## 2012-05-26 DIAGNOSIS — N200 Calculus of kidney: Secondary | ICD-10-CM

## 2012-05-26 DIAGNOSIS — M5380 Other specified dorsopathies, site unspecified: Secondary | ICD-10-CM

## 2012-05-26 MED ORDER — HYDROCODONE-ACETAMINOPHEN 5-500 MG PO TABS: 1.0000 | ORAL_TABLET | Freq: Four times a day (QID) | ORAL | Status: DC | PRN

## 2012-05-26 MED ORDER — IBUPROFEN 800 MG PO TABS: 800.0000 mg | ORAL_TABLET | Freq: Three times a day (TID) | ORAL | Status: DC | PRN

## 2012-05-26 MED ORDER — TIZANIDINE HCL 4 MG PO CAPS: 4.0000 mg | ORAL_CAPSULE | Freq: Three times a day (TID) | ORAL | Status: DC | PRN

## 2012-05-26 NOTE — Progress Notes (Signed)
Subjective:    Patient ID: Alice Simmons, female    DOB: [birth year], 56 y.o.   MRN: [id number]  HPI 56 year old female with history of hypertension, anxiety, depression, and recent fracture of the left fifth metatarsal presents for followup. She reports severe pain in her left foot which she rates as 10 out of 10. She has used all of her Vicodin and has no additional pain medication. She has been using ibuprofen 600 mg once daily with no improvement. She reports she has followup with her orthopedic surgeon next week.  In regards to anxiety and depression, her husband reports some concern that her symptoms have been worse in general since she moved to [place]. She reports compliance with her medications. He would like to establish care with a psychiatrist to evaluate ongoing issues of depression however feel that they do not have the money for specialist copay at this time.  In regards to chronic low back pain, she reports significant improvement with the use of Zanaflex which was given by her pain specialist.  Outpatient Encounter Prescriptions as of 05/26/2012  Medication Sig Dispense Refill  . alprazolam (XANAX) 2 MG tablet Take 1 tablet (2 mg total) by mouth 2 (two) times daily as needed.  60 tablet  3  . amLODipine (NORVASC) 10 MG tablet Take 1 tablet (10 mg total) by mouth daily.  30 tablet  5  . Bismuth Subsalicylate 525 MG/15ML SUSP Take 15 mLs (525 mg total) by mouth 4 (four) times daily.  600 mL  0  . Eszopiclone (ESZOPICLONE) 3 MG TABS Take 1 tablet (3 mg total) by mouth at bedtime. Take immediately before bedtime  30 tablet  0  . HYDROcodone-acetaminophen (VICODIN) 5-500 MG per tablet Take 1 tablet by mouth every 6 (six) hours as needed for pain.  60 tablet  0  . labetalol (NORMODYNE) 300 MG tablet Take 1 tablet (300 mg total) by mouth 2 (two) times daily.  180 tablet  0  . levothyroxine (SYNTHROID, LEVOTHROID) 100 MCG tablet Take 1 tablet (100 mcg total) by mouth daily.  90  tablet  3  . losartan (COZAAR) 50 MG tablet Take 1 tablet (50 mg total) by mouth daily.  30 tablet  3  . omeprazole (PRILOSEC) 20 MG capsule Take 1 capsule (20 mg total) by mouth 2 (two) times daily.  60 capsule  11  . PARoxetine (PAXIL) 30 MG tablet Take 1 tablet (30 mg total) by mouth 2 (two) times daily.  180 tablet  3  . propranolol (INDERAL) 40 MG tablet Take 1 tablet (40 mg total) by mouth 2 (two) times daily.  60 tablet  5  . zolpidem (AMBIEN) 10 MG tablet TAKE 1 TABLET AT BEDTIME AS NEEDED  30 tablet  3  . [DISCONTINUED] HYDROcodone-acetaminophen (VICODIN) 5-500 MG per tablet Take 1 tablet by mouth every 6 (six) hours as needed for pain.  60 tablet  0  . ibuprofen (ADVIL,MOTRIN) 800 MG tablet Take 1 tablet (800 mg total) by mouth every 8 (eight) hours as needed for pain.  60 tablet  0  . tiZANidine (ZANAFLEX) 4 MG capsule Take 1 capsule (4 mg total) by mouth 3 (three) times daily as needed for muscle spasms.  90 capsule  3   BP 122/72  Pulse 69  Temp 98.2 F (36.8 C) (Oral)  Resp 15  SpO2 95%  Review of Systems  Constitutional: Negative for fever, chills, appetite change, fatigue and unexpected weight change.  HENT: Negative for ear pain,  congestion, sore throat, trouble swallowing, neck pain, voice change and sinus pressure.   Eyes: Negative for visual disturbance.  Respiratory: Negative for cough, shortness of breath, wheezing and stridor.   Cardiovascular: Negative for chest pain, palpitations and leg swelling.  Gastrointestinal: Negative for nausea, vomiting, abdominal pain, diarrhea, constipation, blood in stool, abdominal distention and anal bleeding.  Genitourinary: Negative for dysuria and flank pain.  Musculoskeletal: Positive for arthralgias. Negative for myalgias and gait problem.  Skin: Negative for color change and rash.  Neurological: Negative for dizziness and headaches.  Hematological: Negative for adenopathy. Does not bruise/bleed easily.  Psychiatric/Behavioral:  Positive for dysphoric mood. Negative for suicidal ideas and sleep disturbance. The patient is nervous/anxious.        Objective:   Physical Exam  Constitutional: She is oriented to person, place, and time. She appears well-developed and well-nourished. No distress.  HENT:  Head: Normocephalic and atraumatic.  Right Ear: External ear normal.  Left Ear: External ear normal.  Nose: Nose normal.  Mouth/Throat: Oropharynx is clear and moist. No oropharyngeal exudate.  Eyes: Conjunctivae normal are normal. Pupils are equal, round, and reactive to light. Right eye exhibits no discharge. Left eye exhibits no discharge. No scleral icterus.  Neck: Normal range of motion. Neck supple. No tracheal deviation present. No thyromegaly present.  Cardiovascular: Normal rate, regular rhythm, normal heart sounds and intact distal pulses.  Exam reveals no gallop and no friction rub.   No murmur heard. Pulmonary/Chest: Effort normal and breath sounds normal. No respiratory distress. She has no wheezes. She has no rales. She exhibits no tenderness.  Musculoskeletal: Normal range of motion. She exhibits no edema and no tenderness.       Air cast present left lower leg. No palpable edema, or calf tenderness.  Lymphadenopathy:    She has no cervical adenopathy.  Neurological: She is alert and oriented to person, place, and time. No cranial nerve deficit. She exhibits normal muscle tone. Coordination normal.  Skin: Skin is warm and dry. No rash noted. She is not diaphoretic. No erythema. No pallor.  Psychiatric: She has a normal mood and affect. Her behavior is normal. Judgment and thought content normal.          Assessment & Plan:

## 2012-05-26 NOTE — Assessment & Plan Note (Signed)
Patient with chronic back pain secondary to muscular spasm. Symptoms are improved with Zanaflex. Will continue. Refill given today. We'll also request recent MRI of the lumbar spine performed by her orthopedic surgeon.

## 2012-05-26 NOTE — Assessment & Plan Note (Signed)
Status post recent fracture of the left fifth metatarsal. Complains of severe pain. Encouraged her to take ibuprofen 800 mg up to 3 times daily. She'll use Vicodin as needed for breakthrough pain. She has followup with her orthopedic surgeon next week.

## 2012-05-26 NOTE — Assessment & Plan Note (Signed)
Has been is concerned about ongoing depression. Offered to set up referral to psychiatry for further evaluation however patient feels they're unable to afford co-pays for specialist this at this point. Made some recommendations for free behavioral health services. Will continue current medications.

## 2012-06-15 HISTORY — PX: CHOLECYSTECTOMY: SHX55

## 2012-06-27 ENCOUNTER — Other Ambulatory Visit: Payer: Self-pay | Admitting: Internal Medicine

## 2012-06-27 MED ORDER — LABETALOL HCL 300 MG PO TABS: 300.0000 mg | ORAL_TABLET | Freq: Two times a day (BID) | ORAL | Status: DC

## 2012-06-27 NOTE — Telephone Encounter (Signed)
labetalol (NORMODYNE) 300 MG tablet  #180

## 2012-06-28 ENCOUNTER — Encounter: Payer: Self-pay | Admitting: Internal Medicine

## 2012-06-28 ENCOUNTER — Ambulatory Visit (INDEPENDENT_AMBULATORY_CARE_PROVIDER_SITE_OTHER): Payer: Medicare Other | Admitting: Internal Medicine

## 2012-06-28 VITALS — BP 110/70 | HR 74 | Temp 99.2°F | Ht 66.0 in | Wt 202.0 lb

## 2012-06-28 DIAGNOSIS — F3289 Other specified depressive episodes: Secondary | ICD-10-CM

## 2012-06-28 DIAGNOSIS — M5380 Other specified dorsopathies, site unspecified: Secondary | ICD-10-CM

## 2012-06-28 DIAGNOSIS — S92909A Unspecified fracture of unspecified foot, initial encounter for closed fracture: Secondary | ICD-10-CM

## 2012-06-28 DIAGNOSIS — E039 Hypothyroidism, unspecified: Secondary | ICD-10-CM

## 2012-06-28 DIAGNOSIS — I10 Essential (primary) hypertension: Secondary | ICD-10-CM

## 2012-06-28 DIAGNOSIS — M62830 Muscle spasm of back: Secondary | ICD-10-CM

## 2012-06-28 DIAGNOSIS — F329 Major depressive disorder, single episode, unspecified: Secondary | ICD-10-CM

## 2012-06-28 DIAGNOSIS — S92902A Unspecified fracture of left foot, initial encounter for closed fracture: Secondary | ICD-10-CM

## 2012-06-28 DIAGNOSIS — E785 Hyperlipidemia, unspecified: Secondary | ICD-10-CM

## 2012-06-28 DIAGNOSIS — Z1239 Encounter for other screening for malignant neoplasm of breast: Secondary | ICD-10-CM

## 2012-06-28 DIAGNOSIS — E669 Obesity, unspecified: Secondary | ICD-10-CM

## 2012-06-28 NOTE — Assessment & Plan Note (Signed)
BP Readings from Last 3 Encounters:  06/28/12 110/70  05/26/12 122/72  04/27/12 118/70   BP well controlled with current medications. Will continue. Will check renal function with labs in 3 months.

## 2012-06-28 NOTE — Assessment & Plan Note (Signed)
Plan to repeat TSH with labs in 3 months. Continue Synthroid.

## 2012-06-28 NOTE — Assessment & Plan Note (Signed)
Symptoms improved since last visit. Reviewed previous MRI lumbar spine findings with patient and her husband. For now, will continue Zanaflex prn. Discussed potential referral to neurosurgeon and for physical therapy, but pt would like to hold off for now.

## 2012-06-28 NOTE — Assessment & Plan Note (Signed)
Symptoms improved with pt daughter staying in home. Will continue to monitor.

## 2012-06-28 NOTE — Assessment & Plan Note (Signed)
Body mass index is 32.60 kg/(m^2).  No change in weight since last visit. Encouraged improved diet, lower in processed carbohydrates. Encouraged her to resume physical activity such as walking with goal of 3x per week.

## 2012-06-28 NOTE — Progress Notes (Signed)
Subjective:    Patient ID: Alice Simmons, female    DOB: 1955/06/27, 57 y.o.   MRN: [id number]  HPI 57YO female with hypothyroidism, HTN, obesity, depression, chronic low back pain and recent left foot fracture presents for follow up. Doing well. Recently out of left foot boot. Declined physical therapy. Reports that low back pain has improved, however occasionally has some low back pain and uses ibuprofen or Zanaflex with improvement. Mood improved with daughter staying in home. Notes some dietary indiscretion recently, no regular physical activity.  Outpatient Encounter Prescriptions as of 06/28/2012  Medication Sig Dispense Refill  . alprazolam (XANAX) 2 MG tablet Take 1 tablet (2 mg total) by mouth 2 (two) times daily as needed.  60 tablet  3  . amLODipine (NORVASC) 10 MG tablet Take 1 tablet (10 mg total) by mouth daily.  30 tablet  5  . Eszopiclone (ESZOPICLONE) 3 MG TABS Take 1 tablet (3 mg total) by mouth at bedtime. Take immediately before bedtime  30 tablet  0  . HYDROcodone-acetaminophen (VICODIN) 5-500 MG per tablet Take 1 tablet by mouth every 6 (six) hours as needed for pain.  60 tablet  0  . ibuprofen (ADVIL,MOTRIN) 800 MG tablet Take 1 tablet (800 mg total) by mouth every 8 (eight) hours as needed for pain.  60 tablet  0  . labetalol (NORMODYNE) 300 MG tablet Take 1 tablet (300 mg total) by mouth 2 (two) times daily.  180 tablet  6  . levothyroxine (SYNTHROID, LEVOTHROID) 100 MCG tablet Take 1 tablet (100 mcg total) by mouth daily.  90 tablet  3  . losartan (COZAAR) 50 MG tablet Take 1 tablet (50 mg total) by mouth daily.  30 tablet  3  . PARoxetine (PAXIL) 30 MG tablet Take 1 tablet (30 mg total) by mouth 2 (two) times daily.  180 tablet  3  . propranolol (INDERAL) 40 MG tablet Take 1 tablet (40 mg total) by mouth 2 (two) times daily.  60 tablet  5  . tiZANidine (ZANAFLEX) 4 MG capsule Take 1 capsule (4 mg total) by mouth 3 (three) times daily as needed for muscle spasms.  90  capsule  3  . zolpidem (AMBIEN) 10 MG tablet TAKE 1 TABLET AT BEDTIME AS NEEDED  30 tablet  3   BP 110/70  Pulse 74  Temp 99.2 F (37.3 C) (Oral)  Ht 5\' 6"  (1.676 m)  Wt 202 lb (91.627 kg)  BMI 32.60 kg/m2  SpO2 97%  Review of Systems  Constitutional: Negative for fever, chills, appetite change, fatigue and unexpected weight change.  HENT: Negative for ear pain, congestion, sore throat, trouble swallowing, neck pain, voice change and sinus pressure.   Eyes: Negative for visual disturbance.  Respiratory: Negative for cough, shortness of breath, wheezing and stridor.   Cardiovascular: Negative for chest pain, palpitations and leg swelling.  Gastrointestinal: Negative for nausea, vomiting, abdominal pain, diarrhea, constipation, blood in stool, abdominal distention and anal bleeding.  Genitourinary: Negative for dysuria and flank pain.  Musculoskeletal: Positive for myalgias and back pain. Negative for arthralgias and gait problem.  Skin: Negative for color change and rash.  Neurological: Negative for dizziness and headaches.  Hematological: Negative for adenopathy. Does not bruise/bleed easily.  Psychiatric/Behavioral: Negative for suicidal ideas, sleep disturbance and dysphoric mood. The patient is not nervous/anxious.        Objective:   Physical Exam  Constitutional: She is oriented to person, place, and time. She appears well-developed and well-nourished. No distress.  HENT:  Head: Normocephalic and atraumatic.  Right Ear: External ear normal.  Left Ear: External ear normal.  Nose: Nose normal.  Mouth/Throat: Oropharynx is clear and moist. No oropharyngeal exudate.  Eyes: Conjunctivae normal are normal. Pupils are equal, round, and reactive to light. Right eye exhibits no discharge. Left eye exhibits no discharge. No scleral icterus.  Neck: Normal range of motion. Neck supple. No tracheal deviation present. No thyromegaly present.  Cardiovascular: Normal rate, regular rhythm,  normal heart sounds and intact distal pulses.  Exam reveals no gallop and no friction rub.   No murmur heard. Pulmonary/Chest: Effort normal and breath sounds normal. No respiratory distress. She has no wheezes. She has no rales. She exhibits no tenderness.  Musculoskeletal: Normal range of motion. She exhibits no edema and no tenderness.       Lumbar back: She exhibits normal range of motion, no tenderness and no pain.  Lymphadenopathy:    She has no cervical adenopathy.  Neurological: She is alert and oriented to person, place, and time. No cranial nerve deficit. She exhibits normal muscle tone. Coordination normal.  Skin: Skin is warm and dry. No rash noted. She is not diaphoretic. No erythema. No pallor.  Psychiatric: She has a normal mood and affect. Her behavior is normal. Judgment and thought content normal.          Assessment & Plan:

## 2012-06-29 ENCOUNTER — Other Ambulatory Visit: Payer: Self-pay | Admitting: Internal Medicine

## 2012-06-29 MED ORDER — LOSARTAN POTASSIUM 50 MG PO TABS: 50.0000 mg | ORAL_TABLET | Freq: Every day | ORAL | Status: DC

## 2012-06-29 NOTE — Telephone Encounter (Signed)
losartan (COZAAR) 50 MG tablet #30

## 2012-07-22 ENCOUNTER — Other Ambulatory Visit: Payer: Self-pay | Admitting: *Deleted

## 2012-07-30 ENCOUNTER — Other Ambulatory Visit: Payer: Self-pay

## 2012-08-18 ENCOUNTER — Ambulatory Visit: Payer: Self-pay | Admitting: Internal Medicine

## 2012-09-07 ENCOUNTER — Other Ambulatory Visit: Payer: Self-pay | Admitting: *Deleted

## 2012-09-08 MED ORDER — PROPRANOLOL HCL 40 MG PO TABS: 40.0000 mg | ORAL_TABLET | Freq: Two times a day (BID) | ORAL | Status: DC

## 2012-09-08 MED ORDER — ZOLPIDEM TARTRATE 10 MG PO TABS: ORAL_TABLET | ORAL | Status: DC

## 2012-09-12 ENCOUNTER — Encounter: Payer: Self-pay | Admitting: Internal Medicine

## 2012-09-14 ENCOUNTER — Other Ambulatory Visit: Payer: Self-pay | Admitting: Internal Medicine

## 2012-09-15 ENCOUNTER — Other Ambulatory Visit: Payer: Self-pay | Admitting: *Deleted

## 2012-09-15 MED ORDER — AMLODIPINE BESYLATE 10 MG PO TABS: 10.0000 mg | ORAL_TABLET | Freq: Every day | ORAL | Status: DC

## 2012-09-15 NOTE — Telephone Encounter (Signed)
Eprescribed.

## 2012-09-21 ENCOUNTER — Other Ambulatory Visit: Payer: Self-pay | Admitting: *Deleted

## 2012-09-21 ENCOUNTER — Ambulatory Visit (INDEPENDENT_AMBULATORY_CARE_PROVIDER_SITE_OTHER): Payer: Medicare Other | Admitting: Adult Health

## 2012-09-21 ENCOUNTER — Encounter: Payer: Self-pay | Admitting: Adult Health

## 2012-09-21 VITALS — BP 126/84 | HR 65 | Temp 98.5°F | Resp 16 | Wt 204.8 lb

## 2012-09-21 DIAGNOSIS — J111 Influenza due to unidentified influenza virus with other respiratory manifestations: Secondary | ICD-10-CM

## 2012-09-21 DIAGNOSIS — J069 Acute upper respiratory infection, unspecified: Secondary | ICD-10-CM

## 2012-09-21 DIAGNOSIS — F419 Anxiety disorder, unspecified: Secondary | ICD-10-CM

## 2012-09-21 DIAGNOSIS — R6889 Other general symptoms and signs: Secondary | ICD-10-CM

## 2012-09-21 MED ORDER — AZITHROMYCIN 250 MG PO TABS: ORAL_TABLET | ORAL | Status: DC

## 2012-09-21 MED ORDER — FLUTICASONE PROPIONATE 50 MCG/ACT NA SUSP: 2.0000 | Freq: Every day | NASAL | Status: DC

## 2012-09-21 MED ORDER — ALPRAZOLAM 2 MG PO TABS: 2.0000 mg | ORAL_TABLET | Freq: Two times a day (BID) | ORAL | Status: DC | PRN

## 2012-09-21 NOTE — Patient Instructions (Addendum)
  Please start the Azithromycin today - 2 tablets today then 1 tablet daily for the next 4 days.  I have also prescribed Flonase nasal spray - 2 sprays into each nostril at bedtime.  Tylenol or Aleve/Ibuprofen for body aches and fever.  Drink plenty of fluids to stay hydrated.

## 2012-09-21 NOTE — Assessment & Plan Note (Signed)
Start azithromycin. Also ordered a Flonase nasal spray 2 sprays into each nostril at bedtime. Encouraged to drink plenty of fluids to stay hydrated. She may take Tylenol or ibuprofen for fever and general body aches.

## 2012-09-21 NOTE — Progress Notes (Signed)
Subjective:    Patient ID: Alice Simmons, female    DOB: 1956/02/07, 57 y.o.   MRN: [id number]  HPI  Patient is a pleasant 57 year old female who presents to clinic with flulike symptoms. She is experiencing sinus pressure, cough, fever, general body aches and pains. She has been taking over-the-counter products such as Mucinex; however, these have not been very helpful. She denies shortness of breath or chest pain.   Current Outpatient Prescriptions on File Prior to Visit  Medication Sig Dispense Refill  . alprazolam (XANAX) 2 MG tablet Take 1 tablet (2 mg total) by mouth 2 (two) times daily as needed.  60 tablet  3  . amLODipine (NORVASC) 10 MG tablet Take 1 tablet (10 mg total) by mouth daily.  30 tablet  3  . Eszopiclone (ESZOPICLONE) 3 MG TABS Take 1 tablet (3 mg total) by mouth at bedtime. Take immediately before bedtime  30 tablet  0  . HYDROcodone-acetaminophen (VICODIN) 5-500 MG per tablet Take 1 tablet by mouth every 6 (six) hours as needed for pain.  60 tablet  0  . ibuprofen (ADVIL,MOTRIN) 800 MG tablet Take 1 tablet (800 mg total) by mouth every 8 (eight) hours as needed for pain.  60 tablet  0  . labetalol (NORMODYNE) 300 MG tablet Take 1 tablet (300 mg total) by mouth 2 (two) times daily.  180 tablet  6  . levothyroxine (SYNTHROID, LEVOTHROID) 100 MCG tablet Take 1 tablet (100 mcg total) by mouth daily.  90 tablet  3  . losartan (COZAAR) 50 MG tablet Take 1 tablet (50 mg total) by mouth daily.  30 tablet  6  . PARoxetine (PAXIL) 30 MG tablet Take 1 tablet (30 mg total) by mouth 2 (two) times daily.  180 tablet  3  . propranolol (INDERAL) 40 MG tablet Take 1 tablet (40 mg total) by mouth 2 (two) times daily.  60 tablet  5  . tiZANidine (ZANAFLEX) 4 MG tablet TAKE 1 TABLET BY MOUTH THREE TIMES DAILY AS NEEDED FOR MUSCLE SPASMS  90 tablet  0  . zolpidem (AMBIEN) 10 MG tablet TAKE 1 TABLET AT BEDTIME AS NEEDED  30 tablet  3   No current facility-administered medications on file  prior to visit.     Review of Systems  Constitutional: Positive for fever and chills.  HENT: Positive for congestion, postnasal drip and sinus pressure.        Ear fullness bilaterally  Respiratory: Positive for cough. Negative for chest tightness, shortness of breath and wheezing.   Cardiovascular: Negative for chest pain.  Gastrointestinal: Negative for nausea and vomiting.  Neurological: Positive for headaches. Negative for dizziness.    BP 126/84  Pulse 65  Temp(Src) 98.5 F (36.9 C) (Oral)  Resp 16  Wt 204 lb 12 oz (92.874 kg)  BMI 33.06 kg/m2  SpO2 96%     Objective:   Physical Exam  Constitutional: She is oriented to person, place, and time. No distress.  Overweight, appears uncomfortable  HENT:  Mouth/Throat: No oropharyngeal exudate.  Bilateral ears with considerable amount of cerumen. Tympanic membrane visualized and without abnormality. Pharyngeal erythema with considerable amounts of drainage posterior pharynx.  Neck: Normal range of motion.  Cardiovascular: Normal rate and regular rhythm.   Pulmonary/Chest: Effort normal and breath sounds normal. No respiratory distress. She has no wheezes. She has no rales.  Lymphadenopathy:    She has no cervical adenopathy.  Neurological: She is alert and oriented to person, place, and time.  Psychiatric:  She has a normal mood and affect. Her behavior is normal.       Assessment & Plan:

## 2012-09-28 ENCOUNTER — Other Ambulatory Visit: Payer: Self-pay | Admitting: Internal Medicine

## 2012-09-29 NOTE — Telephone Encounter (Signed)
Med filled.  

## 2012-10-07 ENCOUNTER — Other Ambulatory Visit (INDEPENDENT_AMBULATORY_CARE_PROVIDER_SITE_OTHER): Payer: Medicare Other

## 2012-10-07 DIAGNOSIS — I10 Essential (primary) hypertension: Secondary | ICD-10-CM

## 2012-10-07 DIAGNOSIS — E039 Hypothyroidism, unspecified: Secondary | ICD-10-CM

## 2012-10-07 DIAGNOSIS — E785 Hyperlipidemia, unspecified: Secondary | ICD-10-CM

## 2012-10-07 LAB — COMPREHENSIVE METABOLIC PANEL
Albumin: 4.2 g/dL (ref 3.5–5.2)
BUN: 18 mg/dL (ref 6–23)
Calcium: 9.5 mg/dL (ref 8.4–10.5)
Chloride: 101 mEq/L (ref 96–112)
Creatinine, Ser: 0.9 mg/dL (ref 0.4–1.2)
GFR: 70.15 mL/min (ref >60.00)
Glucose, Bld: 121 mg/dL — ABNORMAL HIGH (ref 70–99)
Potassium: 4.2 mEq/L (ref 3.5–5.1)

## 2012-10-07 LAB — TSH: TSH: 1.4 u[IU]/mL (ref 0.35–5.50)

## 2012-10-07 LAB — LIPID PANEL
Cholesterol: 293 mg/dL — ABNORMAL HIGH (ref 0–200)
Triglycerides: 339 mg/dL — ABNORMAL HIGH (ref 0.0–149.0)

## 2012-10-07 LAB — LDL CHOLESTEROL, DIRECT: Direct LDL: 215 mg/dL

## 2012-10-08 ENCOUNTER — Other Ambulatory Visit: Payer: Self-pay | Admitting: Internal Medicine

## 2012-10-10 NOTE — Telephone Encounter (Signed)
Refill denied, refilled 09/15/12 #30 with 3 refills, verified with [facility]

## 2012-10-12 ENCOUNTER — Ambulatory Visit (INDEPENDENT_AMBULATORY_CARE_PROVIDER_SITE_OTHER): Payer: Medicare Other | Admitting: Internal Medicine

## 2012-10-12 ENCOUNTER — Encounter: Payer: Self-pay | Admitting: Internal Medicine

## 2012-10-12 VITALS — BP 110/78 | HR 68 | Temp 98.4°F

## 2012-10-12 DIAGNOSIS — E785 Hyperlipidemia, unspecified: Secondary | ICD-10-CM

## 2012-10-12 DIAGNOSIS — R7309 Other abnormal glucose: Secondary | ICD-10-CM

## 2012-10-12 DIAGNOSIS — F419 Anxiety disorder, unspecified: Secondary | ICD-10-CM

## 2012-10-12 DIAGNOSIS — E669 Obesity, unspecified: Secondary | ICD-10-CM

## 2012-10-12 DIAGNOSIS — I10 Essential (primary) hypertension: Secondary | ICD-10-CM

## 2012-10-12 DIAGNOSIS — R739 Hyperglycemia, unspecified: Secondary | ICD-10-CM | POA: Insufficient documentation

## 2012-10-12 DIAGNOSIS — F411 Generalized anxiety disorder: Secondary | ICD-10-CM

## 2012-10-12 MED ORDER — ALPRAZOLAM 2 MG PO TABS: 2.0000 mg | ORAL_TABLET | Freq: Two times a day (BID) | ORAL | Status: DC | PRN

## 2012-10-12 MED ORDER — ZOLPIDEM TARTRATE 10 MG PO TABS: ORAL_TABLET | ORAL | Status: DC

## 2012-10-12 MED ORDER — ATORVASTATIN CALCIUM 20 MG PO TABS: 20.0000 mg | ORAL_TABLET | Freq: Every day | ORAL | Status: DC

## 2012-10-12 NOTE — Progress Notes (Signed)
Subjective:    Patient ID: Alice Simmons, female    DOB: 1956-04-24, 57 y.o.   MRN: [id number]  HPI 57 year old female with history of hypertension, depression, anxiety, obesity presents for followup. She is concerned today about weight gain. She reports that she "eats nothing during the day "however her husband notes that she frequently been just at night. She has declined referral to nutritionist. She does not to keep a food diary or follow any specific exercise program.  We reviewed recent labs which show elevated blood sugar and cholesterol. We reviewed family history in her significant family history of both high cholesterol and heart disease. Encouraged patient to consider starting statin medication. She reports lack of caring whether or not she dies from heart disease. We then discussed ongoing symptoms of depression and anxiety. She declines referral to psychiatry.  Outpatient Encounter Prescriptions as of 10/12/2012  Medication Sig Dispense Refill  . alprazolam (XANAX) 2 MG tablet Take 1 tablet (2 mg total) by mouth 2 (two) times daily as needed.  60 tablet  3  . amLODipine (NORVASC) 10 MG tablet Take 1 tablet (10 mg total) by mouth daily.  30 tablet  3  . fluticasone (FLONASE) 50 MCG/ACT nasal spray Place 2 sprays into the nose daily.  16 g  6  . ibuprofen (ADVIL,MOTRIN) 800 MG tablet Take 1 tablet (800 mg total) by mouth every 8 (eight) hours as needed for pain.  60 tablet  0  . labetalol (NORMODYNE) 300 MG tablet Take 1 tablet (300 mg total) by mouth 2 (two) times daily.  180 tablet  6  . levothyroxine (SYNTHROID, LEVOTHROID) 100 MCG tablet Take 1 tablet (100 mcg total) by mouth daily.  90 tablet  3  . losartan (COZAAR) 50 MG tablet Take 1 tablet (50 mg total) by mouth daily.  30 tablet  6  . PARoxetine (PAXIL) 30 MG tablet Take 1 tablet (30 mg total) by mouth 2 (two) times daily.  180 tablet  3  . propranolol (INDERAL) 40 MG tablet Take 1 tablet (40 mg total) by mouth 2 (two) times  daily.  60 tablet  5  . tiZANidine (ZANAFLEX) 4 MG tablet TAKE 1 TABLET BY MOUTH THREE TIMES DAILY AS NEEDED FOR MUSCLE SPASMS  90 tablet  0  . zolpidem (AMBIEN) 10 MG tablet TAKE 1 TABLET AT BEDTIME AS NEEDED  30 tablet  3  . atorvastatin (LIPITOR) 20 MG tablet Take 1 tablet (20 mg total) by mouth daily.  90 tablet  3  . [DISCONTINUED] azithromycin (ZITHROMAX) 250 MG tablet Take 2 tablets today then take 1 daily for the next 4 days.  6 tablet  0   No facility-administered encounter medications on file as of 10/12/2012.   BP 110/78  Pulse 68  Temp(Src) 98.4 F (36.9 C) (Oral)  SpO2 96%  Review of Systems  Constitutional: Negative for fever, chills, appetite change, fatigue and unexpected weight change.  HENT: Negative for ear pain, congestion, sore throat, trouble swallowing, neck pain, voice change and sinus pressure.   Eyes: Negative for visual disturbance.  Respiratory: Negative for cough, shortness of breath, wheezing and stridor.   Cardiovascular: Negative for chest pain, palpitations and leg swelling.  Gastrointestinal: Negative for nausea, vomiting, abdominal pain, diarrhea, constipation, blood in stool, abdominal distention and anal bleeding.  Genitourinary: Negative for dysuria and flank pain.  Musculoskeletal: Negative for myalgias, arthralgias and gait problem.  Skin: Negative for color change and rash.  Neurological: Negative for dizziness and headaches.  Hematological: Negative for adenopathy. Does not bruise/bleed easily.  Psychiatric/Behavioral: Positive for dysphoric mood. Negative for suicidal ideas and sleep disturbance. The patient is nervous/anxious.        Objective:   Physical Exam  Constitutional: She is oriented to person, place, and time. She appears well-developed and well-nourished. No distress.  HENT:  Head: Normocephalic and atraumatic.  Right Ear: External ear normal.  Left Ear: External ear normal.  Nose: Nose normal.  Mouth/Throat: Oropharynx is  clear and moist. No oropharyngeal exudate.  Eyes: Conjunctivae are normal. Pupils are equal, round, and reactive to light. Right eye exhibits no discharge. Left eye exhibits no discharge. No scleral icterus.  Neck: Normal range of motion. Neck supple. No tracheal deviation present. No thyromegaly present.  Cardiovascular: Normal rate, regular rhythm, normal heart sounds and intact distal pulses.  Exam reveals no gallop and no friction rub.   No murmur heard. Pulmonary/Chest: Effort normal and breath sounds normal. No accessory muscle usage. Not tachypneic. No respiratory distress. She has no decreased breath sounds. She has no wheezes. She has no rhonchi. She has no rales. She exhibits no tenderness.  Musculoskeletal: Normal range of motion. She exhibits no edema and no tenderness.  Lymphadenopathy:    She has no cervical adenopathy.  Neurological: She is alert and oriented to person, place, and time. No cranial nerve deficit. She exhibits normal muscle tone. Coordination normal.  Skin: Skin is warm and dry. No rash noted. She is not diaphoretic. No erythema. No pallor.  Psychiatric: She has a normal mood and affect. Her behavior is normal. Judgment and thought content normal.          Assessment & Plan:

## 2012-10-12 NOTE — Assessment & Plan Note (Signed)
Wt Readings from Last 3 Encounters:  09/21/12 204 lb 12 oz (92.874 kg)  06/28/12 202 lb (91.627 kg)  04/27/12 202 lb 4 oz (91.74 kg)   Patient reports feeling depressed about her weight gain. However, she declines referral to nutritionist. She will not keep a food diary. She does not get regular exercise. Homero Fellers discussion today about need for overall change in diet and exercise in order to improve weight. Recommended that she set a goal of walking 30 minutes per day.

## 2012-10-12 NOTE — Assessment & Plan Note (Signed)
Symptoms are poorly controlled on current medications. Recommended referral back to psychiatry which she declined.

## 2012-10-12 NOTE — Assessment & Plan Note (Signed)
We reviewed recent elevated cholesterol including LDL greater than 200 consistent with familial hyperlipidemia. Calculated 10 year risk heart disease 19.1% based on new calculator. Recommended starting statin. Will start Atorvastatin 20mg  daily. Repeat CMP and lipids in 1 month.

## 2012-10-12 NOTE — Assessment & Plan Note (Signed)
BP Readings from Last 3 Encounters:  10/12/12 110/78  09/21/12 126/84  06/28/12 110/70   Blood pressure well-controlled on current medication. Will continue.

## 2012-10-12 NOTE — Assessment & Plan Note (Signed)
Recent fasting BG elevated. Will check A1c with next labs.

## 2012-11-02 ENCOUNTER — Other Ambulatory Visit: Payer: Self-pay | Admitting: *Deleted

## 2012-11-02 DIAGNOSIS — E039 Hypothyroidism, unspecified: Secondary | ICD-10-CM

## 2012-11-02 MED ORDER — PAROXETINE HCL 30 MG PO TABS: 30.0000 mg | ORAL_TABLET | Freq: Two times a day (BID) | ORAL | Status: DC

## 2012-11-02 MED ORDER — LEVOTHYROXINE SODIUM 100 MCG PO TABS: 100.0000 ug | ORAL_TABLET | Freq: Every day | ORAL | Status: DC

## 2012-11-02 NOTE — Telephone Encounter (Signed)
Eprescribed.

## 2012-11-09 ENCOUNTER — Other Ambulatory Visit (INDEPENDENT_AMBULATORY_CARE_PROVIDER_SITE_OTHER): Payer: Medicare Other

## 2012-11-09 DIAGNOSIS — R7309 Other abnormal glucose: Secondary | ICD-10-CM

## 2012-11-09 DIAGNOSIS — R739 Hyperglycemia, unspecified: Secondary | ICD-10-CM

## 2012-11-09 DIAGNOSIS — E785 Hyperlipidemia, unspecified: Secondary | ICD-10-CM

## 2012-11-09 LAB — COMPREHENSIVE METABOLIC PANEL
ALT: 52 U/L — ABNORMAL HIGH (ref 0–35)
AST: 37 U/L (ref 0–37)
Albumin: 3.9 g/dL (ref 3.5–5.2)
Alkaline Phosphatase: 131 U/L — ABNORMAL HIGH (ref 39–117)
Potassium: 4.2 mEq/L (ref 3.5–5.1)
Sodium: 142 mEq/L (ref 135–145)
Total Bilirubin: 0.5 mg/dL (ref 0.3–1.2)
Total Protein: 7.6 g/dL (ref 6.0–8.3)

## 2012-11-09 LAB — LDL CHOLESTEROL, DIRECT: Direct LDL: 162.8 mg/dL

## 2012-11-09 LAB — HEMOGLOBIN A1C: Hgb A1c MFr Bld: 6.4 % (ref 4.6–6.5)

## 2012-11-15 ENCOUNTER — Encounter: Payer: Self-pay | Admitting: Internal Medicine

## 2012-11-15 ENCOUNTER — Ambulatory Visit (INDEPENDENT_AMBULATORY_CARE_PROVIDER_SITE_OTHER): Payer: Medicare Other | Admitting: Internal Medicine

## 2012-11-15 VITALS — BP 108/64 | HR 68 | Temp 98.5°F | Wt 200.0 lb

## 2012-11-15 DIAGNOSIS — M1990 Unspecified osteoarthritis, unspecified site: Secondary | ICD-10-CM

## 2012-11-15 DIAGNOSIS — M129 Arthropathy, unspecified: Secondary | ICD-10-CM

## 2012-11-15 DIAGNOSIS — R0609 Other forms of dyspnea: Secondary | ICD-10-CM | POA: Insufficient documentation

## 2012-11-15 DIAGNOSIS — R5383 Other fatigue: Secondary | ICD-10-CM

## 2012-11-15 DIAGNOSIS — R0989 Other specified symptoms and signs involving the circulatory and respiratory systems: Secondary | ICD-10-CM

## 2012-11-15 DIAGNOSIS — R5381 Other malaise: Secondary | ICD-10-CM | POA: Insufficient documentation

## 2012-11-15 HISTORY — DX: Unspecified osteoarthritis, unspecified site: M19.90

## 2012-11-15 MED ORDER — METFORMIN HCL 500 MG PO TABS: 500.0000 mg | ORAL_TABLET | Freq: Two times a day (BID) | ORAL | Status: DC

## 2012-11-15 NOTE — Assessment & Plan Note (Signed)
Symptoms of dyspnea on exertion and orthopnea are concerning for heart failure. Will set up echo for evaluation.

## 2012-11-15 NOTE — Assessment & Plan Note (Signed)
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 Previously followed by Dr. Kathi Ludwig. Will set up referral to local rheumatologist for evaluation.

## 2012-11-15 NOTE — Assessment & Plan Note (Signed)
Significant fatigue most likely related to untreated sleep apnea. Patient was scheduled for sleep study in September 2013 but could not complete because of symptoms of anxiety and agoraphobia. Will try scheduling a home sleep study for evaluation.

## 2012-11-15 NOTE — Assessment & Plan Note (Signed)
Lab Results  Component Value Date   HGBA1C 6.4 11/09/2012   Encouraged continued effort at healthy diet and regular physical activity. Will start Metformin 500mg  po bid. Follow up with A1c in 3 months.

## 2012-11-15 NOTE — Progress Notes (Signed)
Subjective:    Patient ID: Alice Simmons, female    DOB: 09-Jun-1956, 57 y.o.   MRN: [id number]  HPI 57 year old female with history of anxiety disorder,agoraphobia, hypertension, hypothyroidism, and borderline diabetes presents for followup. She has been trying to follow a healthier diet and has lost 4 pounds. She continues to report daytime fatigue. Her husband has noticed her gasping for breath at night. She was scheduled for sleep study in 2013 but never completed this because of concerns about severe anxiety and agoraphobia. She is now having to sleep using 5 pillows to help with shortness of breath. She denies chest pain or palpitations. She is short of breath with minimal physical activity, such as walking up 1 flight of stairs.  She is also concerned about progressive symptoms of pain and swelling in the joints of her hands. She was followed by local rheumatologist and previously had cortisone injections with some improvement. However, her rheumatologist has moved away. She would like to establish with a new provider.  Outpatient Encounter Prescriptions as of 11/15/2012  Medication Sig Dispense Refill  . alprazolam (XANAX) 2 MG tablet Take 1 tablet (2 mg total) by mouth 2 (two) times daily as needed.  60 tablet  3  . amLODipine (NORVASC) 10 MG tablet Take 1 tablet (10 mg total) by mouth daily.  30 tablet  3  . atorvastatin (LIPITOR) 20 MG tablet Take 1 tablet (20 mg total) by mouth daily.  90 tablet  3  . fluticasone (FLONASE) 50 MCG/ACT nasal spray Place 2 sprays into the nose daily.  16 g  6  . ibuprofen (ADVIL,MOTRIN) 800 MG tablet Take 1 tablet (800 mg total) by mouth every 8 (eight) hours as needed for pain.  60 tablet  0  . labetalol (NORMODYNE) 300 MG tablet Take 1 tablet (300 mg total) by mouth 2 (two) times daily.  180 tablet  6  . levothyroxine (SYNTHROID, LEVOTHROID) 100 MCG tablet Take 1 tablet (100 mcg total) by mouth daily.  90 tablet  1  . losartan (COZAAR) 50 MG tablet Take 1  tablet (50 mg total) by mouth daily.  30 tablet  6  . PARoxetine (PAXIL) 30 MG tablet Take 1 tablet (30 mg total) by mouth 2 (two) times daily.  180 tablet  1  . propranolol (INDERAL) 40 MG tablet Take 1 tablet (40 mg total) by mouth 2 (two) times daily.  60 tablet  5  . tiZANidine (ZANAFLEX) 4 MG tablet TAKE 1 TABLET BY MOUTH THREE TIMES DAILY AS NEEDED FOR MUSCLE SPASMS  90 tablet  0  . zolpidem (AMBIEN) 10 MG tablet TAKE 1 TABLET AT BEDTIME AS NEEDED  30 tablet  3  . metFORMIN (GLUCOPHAGE) 500 MG tablet Take 1 tablet (500 mg total) by mouth 2 (two) times daily with a meal.  180 tablet  3   No facility-administered encounter medications on file as of 11/15/2012.   BP 108/64  Pulse 68  Temp(Src) 98.5 F (36.9 C) (Oral)  Wt 200 lb (90.719 kg)  BMI 32.3 kg/m2  SpO2 93%  Review of Systems  Constitutional: Negative for fever, chills, appetite change, fatigue and unexpected weight change.  HENT: Negative for ear pain, congestion, sore throat, trouble swallowing, neck pain, voice change and sinus pressure.   Eyes: Negative for visual disturbance.  Respiratory: Positive for apnea and shortness of breath. Negative for cough, wheezing and stridor.   Cardiovascular: Negative for chest pain, palpitations and leg swelling.  Gastrointestinal: Negative for nausea, vomiting, abdominal  pain, diarrhea, constipation, blood in stool, abdominal distention and anal bleeding.  Genitourinary: Negative for dysuria and flank pain.  Musculoskeletal: Positive for joint swelling and arthralgias. Negative for myalgias and gait problem.  Skin: Negative for color change and rash.  Neurological: Negative for dizziness and headaches.  Hematological: Negative for adenopathy. Does not bruise/bleed easily.  Psychiatric/Behavioral: Positive for sleep disturbance. Negative for suicidal ideas and dysphoric mood. The patient is nervous/anxious.        Objective:   Physical Exam  Constitutional: She is oriented to person,  place, and time. She appears well-developed and well-nourished. No distress.  HENT:  Head: Normocephalic and atraumatic.  Right Ear: External ear normal.  Left Ear: External ear normal.  Nose: Nose normal.  Mouth/Throat: Oropharynx is clear and moist. No oropharyngeal exudate.  Eyes: Conjunctivae are normal. Pupils are equal, round, and reactive to light. Right eye exhibits no discharge. Left eye exhibits no discharge. No scleral icterus.  Neck: Normal range of motion. Neck supple. No tracheal deviation present. No thyromegaly present.  Cardiovascular: Normal rate, regular rhythm, normal heart sounds and intact distal pulses.  Exam reveals no gallop and no friction rub.   No murmur heard. Pulmonary/Chest: Effort normal and breath sounds normal. No accessory muscle usage. Not tachypneic. No respiratory distress. She has no decreased breath sounds. She has no wheezes. She has no rhonchi. She has no rales. She exhibits no tenderness.  Musculoskeletal: Normal range of motion. She exhibits no edema and no tenderness.       Right hand: She exhibits normal range of motion, no tenderness and no bony tenderness.       Left hand: She exhibits normal range of motion, no tenderness and no bony tenderness.  Lymphadenopathy:    She has no cervical adenopathy.  Neurological: She is alert and oriented to person, place, and time. No cranial nerve deficit. She exhibits normal muscle tone. Coordination normal.  Skin: Skin is warm and dry. No rash noted. She is not diaphoretic. No erythema. No pallor.  Psychiatric: She has a normal mood and affect. Her behavior is normal. Judgment and thought content normal.          Assessment & Plan:

## 2012-11-20 ENCOUNTER — Other Ambulatory Visit: Payer: Self-pay | Admitting: Internal Medicine

## 2012-11-25 ENCOUNTER — Other Ambulatory Visit (INDEPENDENT_AMBULATORY_CARE_PROVIDER_SITE_OTHER): Payer: Medicare Other

## 2012-11-25 ENCOUNTER — Other Ambulatory Visit: Payer: Self-pay

## 2012-11-25 DIAGNOSIS — R0609 Other forms of dyspnea: Secondary | ICD-10-CM

## 2012-12-05 ENCOUNTER — Other Ambulatory Visit: Payer: Self-pay | Admitting: Internal Medicine

## 2012-12-05 NOTE — Telephone Encounter (Signed)
Eprescribed.

## 2013-01-05 ENCOUNTER — Ambulatory Visit (INDEPENDENT_AMBULATORY_CARE_PROVIDER_SITE_OTHER): Payer: Medicare Other | Admitting: Internal Medicine

## 2013-01-05 ENCOUNTER — Encounter: Payer: Self-pay | Admitting: Internal Medicine

## 2013-01-05 VITALS — BP 140/86 | HR 72 | Temp 98.6°F | Wt 197.0 lb

## 2013-01-05 DIAGNOSIS — F329 Major depressive disorder, single episode, unspecified: Secondary | ICD-10-CM

## 2013-01-05 DIAGNOSIS — I10 Essential (primary) hypertension: Secondary | ICD-10-CM

## 2013-01-05 DIAGNOSIS — E669 Obesity, unspecified: Secondary | ICD-10-CM

## 2013-01-05 NOTE — Assessment & Plan Note (Signed)
BP Readings from Last 3 Encounters:  01/05/13 140/86  11/15/12 108/64  10/12/12 110/78   Blood pressure is generally been well-controlled on current medications. Will continue.

## 2013-01-05 NOTE — Assessment & Plan Note (Addendum)
Over 40 minutes spent with patient and her husband and both together and separately discussing patient's ongoing symptoms of depression, anxiety, paranoia, agorophobia. Strongly encourage psychiatric referral. Patient initially declined then agreed to a single visit. Question if she might benefit from a mood stabilizing medications however she has had difficulty tolerating these meds past. Will set up psychiatric evaluation. Pt will contract for safety and will contact our office if any concerns/changes prior to her visit.  Over of which >50% spent in face-to-face contact with patient discussing plan of care

## 2013-01-05 NOTE — Assessment & Plan Note (Signed)
Wt Readings from Last 3 Encounters:  01/05/13 197 lb (89.359 kg)  11/15/12 200 lb (90.719 kg)  09/21/12 204 lb 12 oz (92.874 kg)   Congratulated patient on recent weight loss. Encouraged her to continue efforts at healthy diet and to try increasing her physical activity.

## 2013-01-05 NOTE — Assessment & Plan Note (Signed)
Plan to repeat A1c in September 2014.

## 2013-01-05 NOTE — Progress Notes (Signed)
Subjective:    Patient ID: Alice Simmons, female    DOB: 1956-05-10, 57 y.o.   MRN: [id number]  HPI 57 year old female with history of anxiety/depression, obesity, diabetes, hypertension presents for followup. She is tearful today describing ongoing symptoms of depression. She reports that she was previously hospitalized on several occasions for severe depression while living in [place]. She was under psychiatric care at that time but has not establish care with a psychiatrist in [place]. She did not find psychiatric care or counseling helpful. Her husband reports she is frequently tearful. She is anxious and paranoid. She has difficulty sleeping. She typically does not leave her home during the day. Today, she has refused any local psychiatric services. She reports some thoughts of suicide but denies any plans for suicide and will contract for safety.  In regards to her obesity and diabetes, she was recently started on metformin. She has lost 3 pounds by limiting intake of processed carbohydrates. She is not exercising.She does not check her blood sugars.  Outpatient Encounter Prescriptions as of 01/05/2013  Medication Sig Dispense Refill  . alprazolam (XANAX) 2 MG tablet Take 1 tablet (2 mg total) by mouth 2 (two) times daily as needed.  60 tablet  3  . amLODipine (NORVASC) 10 MG tablet Take 1 tablet (10 mg total) by mouth daily.  30 tablet  3  . atorvastatin (LIPITOR) 20 MG tablet Take 1 tablet (20 mg total) by mouth daily.  90 tablet  3  . fluticasone (FLONASE) 50 MCG/ACT nasal spray Place 2 sprays into the nose daily.  16 g  6  . ibuprofen (ADVIL,MOTRIN) 800 MG tablet Take 1 tablet (800 mg total) by mouth every 8 (eight) hours as needed for pain.  60 tablet  0  . labetalol (NORMODYNE) 300 MG tablet Take 1 tablet (300 mg total) by mouth 2 (two) times daily.  180 tablet  6  . levothyroxine (SYNTHROID, LEVOTHROID) 100 MCG tablet Take 1 tablet (100 mcg total) by mouth daily.  90 tablet   1  . losartan (COZAAR) 50 MG tablet Take 1 tablet (50 mg total) by mouth daily.  30 tablet  6  . metFORMIN (GLUCOPHAGE) 500 MG tablet Take 1 tablet (500 mg total) by mouth 2 (two) times daily with a meal.  180 tablet  3  . PARoxetine (PAXIL) 30 MG tablet TAKE 1 TABLET BY MOUTH TWICE DAILY  180 tablet  1  . propranolol (INDERAL) 40 MG tablet Take 1 tablet (40 mg total) by mouth 2 (two) times daily.  60 tablet  5  . tiZANidine (ZANAFLEX) 4 MG tablet TAKE 1 TABLET BY MOUTH THREE TIMES DAILY AS NEEDED FOR MUSCLE SPASMS  90 tablet  0  . zolpidem (AMBIEN) 10 MG tablet TAKE 1 TABLET AT BEDTIME AS NEEDED  30 tablet  3   No facility-administered encounter medications on file as of 01/05/2013.   BP 140/86  Pulse 72  Temp(Src) 98.6 F (37 C) (Oral)  Wt 197 lb (89.359 kg)  BMI 31.81 kg/m2  SpO2 96%  Review of Systems  Constitutional: Negative for fever, chills, appetite change, fatigue and unexpected weight change.  HENT: Negative for ear pain, congestion, sore throat, trouble swallowing, neck pain, voice change and sinus pressure.   Eyes: Negative for visual disturbance.  Respiratory: Negative for cough, shortness of breath, wheezing and stridor.   Cardiovascular: Negative for chest pain, palpitations and leg swelling.  Gastrointestinal: Negative for nausea, vomiting, abdominal pain, diarrhea, constipation, blood in stool,  abdominal distention and anal bleeding.  Genitourinary: Negative for dysuria and flank pain.  Musculoskeletal: Negative for myalgias, arthralgias and gait problem.  Skin: Negative for color change and rash.  Neurological: Negative for dizziness and headaches.  Hematological: Negative for adenopathy. Does not bruise/bleed easily.  Psychiatric/Behavioral: Positive for suicidal ideas, behavioral problems, sleep disturbance and dysphoric mood. The patient is nervous/anxious.        Objective:   Physical Exam  Constitutional: She is oriented to person, place, and time. She  appears well-developed and well-nourished. No distress.  HENT:  Head: Normocephalic and atraumatic.  Right Ear: External ear normal.  Left Ear: External ear normal.  Nose: Nose normal.  Mouth/Throat: Oropharynx is clear and moist. No oropharyngeal exudate.  Eyes: Conjunctivae are normal. Pupils are equal, round, and reactive to light. Right eye exhibits no discharge. Left eye exhibits no discharge. No scleral icterus.  Neck: Normal range of motion. Neck supple. No tracheal deviation present. No thyromegaly present.  Cardiovascular: Normal rate, regular rhythm, normal heart sounds and intact distal pulses.  Exam reveals no gallop and no friction rub.   No murmur heard. Pulmonary/Chest: Effort normal and breath sounds normal. No accessory muscle usage. Not tachypneic. No respiratory distress. She has no decreased breath sounds. She has no wheezes. She has no rhonchi. She has no rales. She exhibits no tenderness.  Musculoskeletal: Normal range of motion. She exhibits no edema and no tenderness.  Lymphadenopathy:    She has no cervical adenopathy.  Neurological: She is alert and oriented to person, place, and time. No cranial nerve deficit. She exhibits normal muscle tone. Coordination normal.  Skin: Skin is warm and dry. No rash noted. She is not diaphoretic. No erythema. No pallor.  Psychiatric: Her speech is normal and behavior is normal. Judgment and thought content normal. Her mood appears anxious. Cognition and memory are normal. She exhibits a depressed mood. She expresses no suicidal ideation. She expresses no suicidal plans.          Assessment & Plan:

## 2013-01-09 ENCOUNTER — Encounter: Payer: Self-pay | Admitting: Emergency Medicine

## 2013-01-27 ENCOUNTER — Encounter: Payer: Self-pay | Admitting: Rheumatology

## 2013-02-01 ENCOUNTER — Other Ambulatory Visit: Payer: Self-pay | Admitting: Internal Medicine

## 2013-02-08 ENCOUNTER — Other Ambulatory Visit: Payer: Self-pay | Admitting: Internal Medicine

## 2013-02-23 ENCOUNTER — Other Ambulatory Visit: Payer: Self-pay | Admitting: Internal Medicine

## 2013-02-28 ENCOUNTER — Other Ambulatory Visit: Payer: Self-pay | Admitting: Internal Medicine

## 2013-03-03 ENCOUNTER — Other Ambulatory Visit: Payer: Self-pay | Admitting: Internal Medicine

## 2013-03-19 ENCOUNTER — Other Ambulatory Visit: Payer: Self-pay | Admitting: Internal Medicine

## 2013-03-29 ENCOUNTER — Ambulatory Visit: Payer: Self-pay | Admitting: Internal Medicine

## 2013-04-03 ENCOUNTER — Encounter: Payer: Self-pay | Admitting: *Deleted

## 2013-04-04 ENCOUNTER — Encounter: Payer: Self-pay | Admitting: Internal Medicine

## 2013-04-04 ENCOUNTER — Other Ambulatory Visit: Payer: Self-pay | Admitting: Internal Medicine

## 2013-04-04 ENCOUNTER — Ambulatory Visit (INDEPENDENT_AMBULATORY_CARE_PROVIDER_SITE_OTHER): Payer: Medicare Other | Admitting: Internal Medicine

## 2013-04-04 VITALS — BP 122/70 | HR 63 | Temp 98.3°F | Wt 186.0 lb

## 2013-04-04 DIAGNOSIS — H6690 Otitis media, unspecified, unspecified ear: Secondary | ICD-10-CM

## 2013-04-04 DIAGNOSIS — H6692 Otitis media, unspecified, left ear: Secondary | ICD-10-CM | POA: Insufficient documentation

## 2013-04-04 DIAGNOSIS — E039 Hypothyroidism, unspecified: Secondary | ICD-10-CM

## 2013-04-04 DIAGNOSIS — J069 Acute upper respiratory infection, unspecified: Secondary | ICD-10-CM | POA: Insufficient documentation

## 2013-04-04 DIAGNOSIS — I10 Essential (primary) hypertension: Secondary | ICD-10-CM

## 2013-04-04 MED ORDER — HYDROCOD POLST-CHLORPHEN POLST 10-8 MG/5ML PO LQCR: 5.0000 mL | Freq: Two times a day (BID) | ORAL | Status: DC | PRN

## 2013-04-04 MED ORDER — LEVOFLOXACIN 500 MG PO TABS: 500.0000 mg | ORAL_TABLET | Freq: Every day | ORAL | Status: DC

## 2013-04-04 NOTE — Assessment & Plan Note (Addendum)
Viral URI with cough now with secondary left OM. Rapid flu negative today. Encouraged rest, increased fluid intake, ibuprofen or tylenol prn pain or fever. Will start Tussionex as needed for cough. Follow up prn if symptoms not improving.

## 2013-04-04 NOTE — Assessment & Plan Note (Signed)
Will check TSH with labs prior to visit in 3 weeks.

## 2013-04-04 NOTE — Telephone Encounter (Signed)
Last visit 01/05/13, refill?

## 2013-04-04 NOTE — Assessment & Plan Note (Signed)
Symptoms and exam consistent with left OM. Will treat with Levaquin. Ibuprofen or tylenol prn pain. Pt will call if symptoms are not improving.

## 2013-04-04 NOTE — Assessment & Plan Note (Signed)
Will plan to check A1c prior to next visit in 3 weeks. Continue metformin.

## 2013-04-04 NOTE — Assessment & Plan Note (Signed)
BP Readings from Last 3 Encounters:  04/04/13 122/70  01/05/13 140/86  11/15/12 108/64   BP well controlled on current medications. Will continue.

## 2013-04-04 NOTE — Progress Notes (Signed)
Subjective:    Patient ID: Alice Simmons, female    DOB: 02/14/1956, 57 y.o.   MRN: [id number]  HPI 57 year old female with history of hypertension, diabetes, hypothyroidism presents for followup. Over the last 2 weeks, she has had subjective fever, chills, diffuse muscle aches, cough productive of purulent sputum. Over the last couple of days she describes bilateral ear pain. She denies change in hearing or discharge from her ears. She has been taking some over-the-counter cough and cold medication with no improvement. Her husband reports she has had very poor by mouth intake. She has lost nearly 10lbs over 2 weeks.  Outpatient Prescriptions Prior to Visit  Medication Sig Dispense Refill  . amLODipine (NORVASC) 10 MG tablet TAKE 1 TABLET BY MOUTH EVERY DAY  30 tablet  5  . atorvastatin (LIPITOR) 20 MG tablet Take 1 tablet (20 mg total) by mouth daily.  90 tablet  3  . fluticasone (FLONASE) 50 MCG/ACT nasal spray Place 2 sprays into the nose daily.  16 g  6  . ibuprofen (ADVIL,MOTRIN) 800 MG tablet Take 1 tablet (800 mg total) by mouth every 8 (eight) hours as needed for pain.  60 tablet  0  . labetalol (NORMODYNE) 300 MG tablet Take 1 tablet (300 mg total) by mouth 2 (two) times daily.  180 tablet  6  . levothyroxine (SYNTHROID, LEVOTHROID) 100 MCG tablet Take 1 tablet (100 mcg total) by mouth daily.  90 tablet  1  . losartan (COZAAR) 50 MG tablet TAKE 1 TABLET BY MOUTH EVERY DAY  30 tablet  5  . metFORMIN (GLUCOPHAGE) 500 MG tablet Take 1 tablet (500 mg total) by mouth 2 (two) times daily with a meal.  180 tablet  3  . omeprazole (PRILOSEC) 20 MG capsule TAKE 1 CAPSULE BY MOUTH TWICE DAILY  60 capsule  5  . PARoxetine (PAXIL) 30 MG tablet TAKE 1 TABLET BY MOUTH TWICE DAILY  180 tablet  1  . propranolol (INDERAL) 40 MG tablet TAKE 1 TABLET BY MOUTH TWICE DAILY  60 tablet  5  . tiZANidine (ZANAFLEX) 4 MG tablet TAKE 1 TABLET BY MOUTH THREE TIMES DAILY AS NEEDED FOR MUSCLE SPASMS  90 tablet  0    No facility-administered medications prior to visit.   BP 122/70  Pulse 63  Temp(Src) 98.3 F (36.8 C) (Oral)  Wt 186 lb (84.369 kg)  BMI 30.04 kg/m2  SpO2 93%  Review of Systems  Constitutional: Positive for fever and chills. Negative for unexpected weight change.  HENT: Positive for ear pain, postnasal drip, rhinorrhea and sinus pressure. Negative for congestion, ear discharge, facial swelling, hearing loss, mouth sores, nosebleeds, sneezing, sore throat, tinnitus, trouble swallowing and voice change.   Eyes: Negative for pain, discharge, redness and visual disturbance.  Respiratory: Positive for cough. Negative for chest tightness, shortness of breath, wheezing and stridor.   Cardiovascular: Negative for chest pain, palpitations and leg swelling.  Musculoskeletal: Positive for myalgias. Negative for arthralgias, neck pain and neck stiffness.  Skin: Negative for color change and rash.  Neurological: Negative for dizziness, weakness, light-headedness and headaches.  Hematological: Negative for adenopathy.  Psychiatric/Behavioral: Positive for dysphoric mood. The patient is nervous/anxious.        Objective:   Physical Exam  Constitutional: She is oriented to person, place, and time. She appears well-developed and well-nourished. No distress.  HENT:  Head: Normocephalic and atraumatic.  Right Ear: External ear normal. Tympanic membrane is not bulging. No middle ear effusion.  Left Ear: External  ear normal. Tympanic membrane is bulging. A middle ear effusion is present.  Nose: Nose normal.  Mouth/Throat: Oropharynx is clear and moist. No oropharyngeal exudate.  Eyes: Conjunctivae are normal. Pupils are equal, round, and reactive to light. Right eye exhibits no discharge. Left eye exhibits no discharge. No scleral icterus.  Neck: Normal range of motion. Neck supple. No tracheal deviation present. No thyromegaly present.  Cardiovascular: Normal rate, regular rhythm, normal heart  sounds and intact distal pulses.  Exam reveals no gallop and no friction rub.   No murmur heard. Pulmonary/Chest: Effort normal and breath sounds normal. No accessory muscle usage. Not tachypneic. No respiratory distress. She has no decreased breath sounds. She has no wheezes. She has no rhonchi. She has no rales. She exhibits no tenderness.  Musculoskeletal: Normal range of motion. She exhibits no edema and no tenderness.  Lymphadenopathy:    She has no cervical adenopathy.  Neurological: She is alert and oriented to person, place, and time. No cranial nerve deficit. She exhibits normal muscle tone. Coordination normal.  Skin: Skin is warm and dry. No rash noted. She is not diaphoretic. No erythema. No pallor.  Psychiatric: She has a normal mood and affect. Her behavior is normal. Judgment and thought content normal.          Assessment & Plan:

## 2013-04-20 ENCOUNTER — Other Ambulatory Visit: Payer: Self-pay

## 2013-04-27 ENCOUNTER — Other Ambulatory Visit (INDEPENDENT_AMBULATORY_CARE_PROVIDER_SITE_OTHER): Payer: Medicare Other

## 2013-04-27 ENCOUNTER — Ambulatory Visit: Payer: Self-pay | Admitting: Adult Health

## 2013-04-27 DIAGNOSIS — IMO0001 Reserved for inherently not codable concepts without codable children: Secondary | ICD-10-CM

## 2013-04-27 DIAGNOSIS — E039 Hypothyroidism, unspecified: Secondary | ICD-10-CM

## 2013-04-27 LAB — LIPID PANEL
Total CHOL/HDL Ratio: 11
VLDL: 71.8 mg/dL — ABNORMAL HIGH (ref 0.0–40.0)

## 2013-04-27 LAB — COMPREHENSIVE METABOLIC PANEL
ALT: 27 U/L (ref 0–35)
AST: 20 U/L (ref 0–37)
Albumin: 4.1 g/dL (ref 3.5–5.2)
Alkaline Phosphatase: 105 U/L (ref 39–117)
Chloride: 103 mEq/L (ref 96–112)
Creatinine, Ser: 0.9 mg/dL (ref 0.4–1.2)
Sodium: 137 mEq/L (ref 135–145)
Total Bilirubin: 0.4 mg/dL (ref 0.3–1.2)

## 2013-04-27 LAB — LDL CHOLESTEROL, DIRECT: Direct LDL: 207 mg/dL

## 2013-04-27 LAB — TSH: TSH: 1.4 u[IU]/mL (ref 0.35–5.50)

## 2013-04-27 LAB — HEMOGLOBIN A1C: Hgb A1c MFr Bld: 6.5 % (ref 4.6–6.5)

## 2013-04-27 LAB — MICROALBUMIN / CREATININE URINE RATIO
Creatinine,U: 180.2 mg/dL
Microalb, Ur: 0.6 mg/dL (ref 0.0–1.9)

## 2013-04-28 ENCOUNTER — Encounter: Payer: Self-pay | Admitting: *Deleted

## 2013-05-01 ENCOUNTER — Ambulatory Visit (INDEPENDENT_AMBULATORY_CARE_PROVIDER_SITE_OTHER): Payer: Medicare Other | Admitting: Internal Medicine

## 2013-05-01 ENCOUNTER — Encounter: Payer: Self-pay | Admitting: Internal Medicine

## 2013-05-01 VITALS — BP 138/80 | HR 71 | Temp 98.6°F | Wt 204.0 lb

## 2013-05-01 DIAGNOSIS — F17200 Nicotine dependence, unspecified, uncomplicated: Secondary | ICD-10-CM

## 2013-05-01 DIAGNOSIS — H6690 Otitis media, unspecified, unspecified ear: Secondary | ICD-10-CM

## 2013-05-01 DIAGNOSIS — E785 Hyperlipidemia, unspecified: Secondary | ICD-10-CM

## 2013-05-01 DIAGNOSIS — Z23 Encounter for immunization: Secondary | ICD-10-CM

## 2013-05-01 DIAGNOSIS — Z720 Tobacco use: Secondary | ICD-10-CM | POA: Insufficient documentation

## 2013-05-01 DIAGNOSIS — H6692 Otitis media, unspecified, left ear: Secondary | ICD-10-CM

## 2013-05-01 MED ORDER — ATORVASTATIN CALCIUM 20 MG PO TABS: 20.0000 mg | ORAL_TABLET | Freq: Every day | ORAL | Status: DC

## 2013-05-01 NOTE — Assessment & Plan Note (Signed)
Symptoms have resolved and exam is normal today.

## 2013-05-01 NOTE — Assessment & Plan Note (Signed)
Discussed recent labs including A1c of 6.5%. Strongly encouraged compliance with metformin. Recommended referral for nutrition counseling. Patient declines. Recommended at least 30 minutes daily of aerobic exercise. Plan to repeat A1c in 3 months.

## 2013-05-01 NOTE — Progress Notes (Signed)
Pre-visit discussion using our clinic review tool. No additional management support is needed unless otherwise documented below in the visit note.  

## 2013-05-01 NOTE — Patient Instructions (Signed)
Please restart Atorvastatin and Metformin.  We will repeat labs in 3 months.

## 2013-05-01 NOTE — Progress Notes (Signed)
Subjective:    Patient ID: Alice Simmons, female    DOB: [birth year], 57 y.o.   MRN: [id number]  HPI 57 year old female with history of hyperlipidemia, hypertension, hypothyroidism, and recent diagnosis of diabetes presents for followup. She continues to smoke. She is not following any particular diet. She is not exercising. Her husband has been trying to encourage her and recently purchased her a walking video to do at home. She has shown little interest in this. She has not been compliant with metformin or atorvastatin.  Outpatient Encounter Prescriptions as of 05/01/2013  Medication Sig  . alprazolam (XANAX) 2 MG tablet TAKE 1 TABLET BY MOUTH TWICE DAILY AS NEEDED.  Marland Kitchen amLODipine (NORVASC) 10 MG tablet TAKE 1 TABLET BY MOUTH EVERY DAY  . atorvastatin (LIPITOR) 20 MG tablet Take 1 tablet (20 mg total) by mouth daily.  . chlorpheniramine-HYDROcodone (TUSSIONEX) 10-8 MG/5ML LQCR Take 5 mLs by mouth every 12 (twelve) hours as needed.  . fluticasone (FLONASE) 50 MCG/ACT nasal spray Place 2 sprays into the nose daily.  Marland Kitchen ibuprofen (ADVIL,MOTRIN) 800 MG tablet Take 1 tablet (800 mg total) by mouth every 8 (eight) hours as needed for pain.  Marland Kitchen labetalol (NORMODYNE) 300 MG tablet Take 1 tablet (300 mg total) by mouth 2 (two) times daily.  Marland Kitchen levothyroxine (SYNTHROID, LEVOTHROID) 100 MCG tablet Take 1 tablet (100 mcg total) by mouth daily.  Marland Kitchen losartan (COZAAR) 50 MG tablet TAKE 1 TABLET BY MOUTH EVERY DAY  . metFORMIN (GLUCOPHAGE) 500 MG tablet Take 1 tablet (500 mg total) by mouth 2 (two) times daily with a meal.  . omeprazole (PRILOSEC) 20 MG capsule TAKE 1 CAPSULE BY MOUTH TWICE DAILY  . PARoxetine (PAXIL) 30 MG tablet TAKE 1 TABLET BY MOUTH TWICE DAILY  . propranolol (INDERAL) 40 MG tablet TAKE 1 TABLET BY MOUTH TWICE DAILY  . tiZANidine (ZANAFLEX) 4 MG tablet TAKE 1 TABLET BY MOUTH THREE TIMES DAILY AS NEEDED FOR MUSCLE SPASMS  . zolpidem (AMBIEN) 10 MG tablet TAKE 1 TABLET BY MOUTH EVERY NIGHT AT  BEDTIME AS NEEDED.   BP 138/80  Pulse 71  Temp(Src) 98.6 F (37 C) (Oral)  Wt 204 lb (92.534 kg)  SpO2 93%  Review of Systems  Constitutional: Negative for fever, chills, appetite change, fatigue and unexpected weight change.  HENT: Positive for congestion. Negative for ear pain, sinus pressure, sore throat, trouble swallowing and voice change.   Eyes: Negative for visual disturbance.  Respiratory: Positive for cough (occasional in the mornings with increased mucous production). Negative for shortness of breath, wheezing and stridor.   Cardiovascular: Negative for chest pain, palpitations and leg swelling.  Gastrointestinal: Negative for nausea, vomiting, abdominal pain, diarrhea, constipation, blood in stool, abdominal distention and anal bleeding.  Genitourinary: Negative for dysuria and flank pain.  Musculoskeletal: Negative for arthralgias, gait problem, myalgias and neck pain.  Skin: Negative for color change and rash.  Neurological: Negative for dizziness and headaches.  Hematological: Negative for adenopathy. Does not bruise/bleed easily.  Psychiatric/Behavioral: Negative for suicidal ideas, sleep disturbance and dysphoric mood. The patient is not nervous/anxious.        Objective:   Physical Exam  Constitutional: She is oriented to person, place, and time. She appears well-developed and well-nourished. No distress.  HENT:  Head: Normocephalic and atraumatic.  Right Ear: External ear normal.  Left Ear: External ear normal.  Nose: Nose normal.  Mouth/Throat: Oropharynx is clear and moist. No oropharyngeal exudate.  Eyes: Conjunctivae are normal. Pupils are equal, round, and reactive  to light. Right eye exhibits no discharge. Left eye exhibits no discharge. No scleral icterus.  Neck: Normal range of motion. Neck supple. No tracheal deviation present. No thyromegaly present.  Cardiovascular: Normal rate, regular rhythm, normal heart sounds and intact distal pulses.  Exam  reveals no gallop and no friction rub.   No murmur heard. Pulmonary/Chest: Effort normal and breath sounds normal. No accessory muscle usage. Not tachypneic. No respiratory distress. She has no decreased breath sounds. She has no wheezes. She has no rhonchi. She has no rales. She exhibits no tenderness.  Musculoskeletal: Normal range of motion. She exhibits no edema and no tenderness.  Lymphadenopathy:    She has no cervical adenopathy.  Neurological: She is alert and oriented to person, place, and time. No cranial nerve deficit. She exhibits normal muscle tone. Coordination normal.  Skin: Skin is warm and dry. No rash noted. She is not diaphoretic. No erythema. No pallor.  Psychiatric: She has a normal mood and affect. Her behavior is normal. Judgment and thought content normal.          Assessment & Plan:

## 2013-05-01 NOTE — Assessment & Plan Note (Signed)
Strongly encourage smoking cessation particularly given patient's history of diabetes and hyperlipidemia. Discussed risk of heart disease.

## 2013-05-01 NOTE — Assessment & Plan Note (Signed)
Patient's lipids noted to be markedly elevated on labs with LDL greater than 200. Discussed risk of heart attack. Encouraged patient to be compliance with her atorvastatin. Refills sent in today. Plan to recheck lipids with labs in 3 months.

## 2013-05-07 ENCOUNTER — Other Ambulatory Visit: Payer: Self-pay | Admitting: Internal Medicine

## 2013-05-11 ENCOUNTER — Other Ambulatory Visit: Payer: Self-pay | Admitting: Internal Medicine

## 2013-05-12 NOTE — Telephone Encounter (Signed)
See if she has enough to get her through until Monday.  Let me know if any problems.

## 2013-05-12 NOTE — Telephone Encounter (Signed)
Noted  

## 2013-05-12 NOTE — Telephone Encounter (Signed)
Per ellen, pharmacist pt is completely out last fill was on 10/21, she will give patient enough #10 until Dr. Dan Humphreys is back on Monday

## 2013-05-15 NOTE — Telephone Encounter (Signed)
Ok to refill 

## 2013-06-02 ENCOUNTER — Other Ambulatory Visit: Payer: Self-pay | Admitting: Internal Medicine

## 2013-06-12 ENCOUNTER — Other Ambulatory Visit: Payer: Self-pay | Admitting: Internal Medicine

## 2013-06-12 NOTE — Telephone Encounter (Signed)
Last refill was on 11/27 and last OV with Dr. Dan Humphreys was on 11/17.

## 2013-06-12 NOTE — Telephone Encounter (Signed)
If she has enough pills for now, would hold off refilling until Dr Tilman Neat return.  Let me know if a problem.

## 2013-06-23 ENCOUNTER — Ambulatory Visit (INDEPENDENT_AMBULATORY_CARE_PROVIDER_SITE_OTHER): Payer: Medicare Other | Admitting: Adult Health

## 2013-06-23 ENCOUNTER — Encounter: Payer: Self-pay | Admitting: Adult Health

## 2013-06-23 VITALS — BP 108/60 | HR 67 | Temp 98.1°F | Resp 12 | Wt 206.5 lb

## 2013-06-23 DIAGNOSIS — R5381 Other malaise: Secondary | ICD-10-CM

## 2013-06-23 DIAGNOSIS — R059 Cough, unspecified: Secondary | ICD-10-CM

## 2013-06-23 DIAGNOSIS — R509 Fever, unspecified: Secondary | ICD-10-CM

## 2013-06-23 DIAGNOSIS — R6889 Other general symptoms and signs: Secondary | ICD-10-CM | POA: Insufficient documentation

## 2013-06-23 DIAGNOSIS — R05 Cough: Secondary | ICD-10-CM

## 2013-06-23 DIAGNOSIS — R062 Wheezing: Secondary | ICD-10-CM

## 2013-06-23 DIAGNOSIS — Z139 Encounter for screening, unspecified: Secondary | ICD-10-CM

## 2013-06-23 DIAGNOSIS — R5383 Other fatigue: Secondary | ICD-10-CM

## 2013-06-23 LAB — POCT INFLUENZA A/B
INFLUENZA A, POC: NEGATIVE
Influenza B, POC: NEGATIVE

## 2013-06-23 MED ORDER — LEVOFLOXACIN 500 MG PO TABS: 500.0000 mg | ORAL_TABLET | Freq: Every day | ORAL | Status: DC

## 2013-06-23 MED ORDER — ALBUTEROL SULFATE (2.5 MG/3ML) 0.083% IN NEBU
2.5000 mg | INHALATION_SOLUTION | Freq: Once | RESPIRATORY_TRACT | Status: AC
  Administered 2013-06-23: 2.5 mg via RESPIRATORY_TRACT

## 2013-06-23 NOTE — Patient Instructions (Signed)
  Start Levaquin 500 mg daily for 5 days.  Tylenol for fever or general discomfort.  Over-the-counter cough medication such as Robitussin or Delsym  Drink plenty of fluid to stay hydrated.  Rest  Please call if her symptoms are not improved within 4-5 days.

## 2013-06-23 NOTE — Progress Notes (Signed)
Pre visit review using our clinic review tool, if applicable. No additional management support is needed unless otherwise documented below in the visit note. 

## 2013-06-23 NOTE — Assessment & Plan Note (Signed)
Negative for influenza. Upper respiratory infection with mild wheezing. Levaquin 500 mg x 5 days

## 2013-06-23 NOTE — Progress Notes (Signed)
Subjective:    Patient ID: Alice Simmons, female    DOB: 1956/04/25, 58 y.o.   MRN: [id number]  HPI  Patient is a 58 year old female presents to clinic with flulike symptoms of fever, chills, cough, and general malaise. In addition she also has significant sinus congestion, rhinorrhea, postnasal drip and irritation of the throat. She has been taking over-the-counter medication for her symptoms. Recent sick contacts.  Current Outpatient Prescriptions on File Prior to Visit  Medication Sig Dispense Refill  . alprazolam (XANAX) 2 MG tablet TAKE 1 TABLET BY MOUTH TWICE DAILY AS NEEDED  60 tablet  0  . amLODipine (NORVASC) 10 MG tablet TAKE 1 TABLET BY MOUTH EVERY DAY  30 tablet  5  . atorvastatin (LIPITOR) 20 MG tablet Take 1 tablet (20 mg total) by mouth daily.  90 tablet  3  . fluticasone (FLONASE) 50 MCG/ACT nasal spray Place 2 sprays into the nose daily.  16 g  6  . ibuprofen (ADVIL,MOTRIN) 800 MG tablet Take 1 tablet (800 mg total) by mouth every 8 (eight) hours as needed for pain.  60 tablet  0  . labetalol (NORMODYNE) 300 MG tablet Take 1 tablet (300 mg total) by mouth 2 (two) times daily.  180 tablet  6  . levothyroxine (SYNTHROID, LEVOTHROID) 100 MCG tablet TAKE 1 TABLET BY MOUTH EVERY DAY  90 tablet  0  . losartan (COZAAR) 50 MG tablet TAKE 1 TABLET BY MOUTH EVERY DAY  30 tablet  5  . metFORMIN (GLUCOPHAGE) 500 MG tablet Take 1 tablet (500 mg total) by mouth 2 (two) times daily with a meal.  180 tablet  3  . omeprazole (PRILOSEC) 20 MG capsule TAKE 1 CAPSULE BY MOUTH TWICE DAILY  60 capsule  5  . PARoxetine (PAXIL) 30 MG tablet TAKE 1 TABLET BY MOUTH TWICE DAILY  180 tablet  1  . propranolol (INDERAL) 40 MG tablet TAKE 1 TABLET BY MOUTH TWICE DAILY  60 tablet  5  . tiZANidine (ZANAFLEX) 4 MG tablet TAKE 1 TABLET BY MOUTH THREE TIMES DAILY AS NEEDED FOR MUSCLE SPASMS  90 tablet  0  . zolpidem (AMBIEN) 10 MG tablet TAKE 1 TABLET BY MOUTH EVERY DAY AT BEDTIME AS NEEDED  30 tablet  0    No current facility-administered medications on file prior to visit.     Review of Systems  Constitutional: Positive for fever and chills.  HENT: Positive for congestion, postnasal drip, rhinorrhea, sinus pressure and sore throat.   Respiratory: Positive for cough and wheezing. Negative for chest tightness and shortness of breath.   Cardiovascular: Negative.   Gastrointestinal: Negative.   Genitourinary: Negative.   Musculoskeletal:       Malaise  Neurological: Negative.        Objective:   Physical Exam  Constitutional: She is oriented to person, place, and time. She appears well-developed and well-nourished. No distress.  HENT:  Head: Normocephalic and atraumatic.  Pharyngeal erythema, drainage, no exudate. Nasal mucosa injected  Cardiovascular: Normal rate, regular rhythm and normal heart sounds.  Exam reveals no gallop.   No murmur heard. Pulmonary/Chest: Effort normal. No respiratory distress. She has wheezes. She has no rales.  Some expiratory wheezing right posterior apex  Musculoskeletal: Normal range of motion.  Lymphadenopathy:    She has cervical adenopathy.  Neurological: She is alert and oriented to person, place, and time.  Skin: Skin is warm and dry.  Psychiatric: She has a normal mood and affect. Her behavior is normal.  Thought content normal.    BP 108/60  Pulse 67  Temp(Src) 98.1 F (36.7 C) (Oral)  Resp 12  Wt 206 lb 8 oz (93.668 kg)  SpO2 98%       Assessment & Plan:

## 2013-07-13 ENCOUNTER — Other Ambulatory Visit: Payer: Self-pay | Admitting: Internal Medicine

## 2013-07-14 ENCOUNTER — Other Ambulatory Visit: Payer: Self-pay | Admitting: Internal Medicine

## 2013-07-14 ENCOUNTER — Emergency Department: Payer: Self-pay | Admitting: Emergency Medicine

## 2013-07-14 LAB — URINALYSIS, COMPLETE
BACTERIA: NONE SEEN
BLOOD: NEGATIVE
GLUCOSE, UR: NEGATIVE mg/dL (ref 0–75)
Hyaline Cast: 1
Ketone: NEGATIVE
Leukocyte Esterase: NEGATIVE
Nitrite: NEGATIVE
Ph: 6 (ref 4.5–8.0)
RBC,UR: 3 /HPF (ref 0–5)
Specific Gravity: 1.026 (ref 1.003–1.030)
Squamous Epithelial: 4

## 2013-07-14 LAB — CBC
HCT: 39.7 % (ref 35.0–47.0)
HGB: 13.3 g/dL (ref 12.0–16.0)
MCH: 30.1 pg (ref 26.0–34.0)
MCHC: 33.5 g/dL (ref 32.0–36.0)
MCV: 90 fL (ref 80–100)
Platelet: 337 10*3/uL (ref 150–440)
RBC: 4.43 10*6/uL (ref 3.80–5.20)
RDW: 15.1 % — ABNORMAL HIGH (ref 11.5–14.5)
WBC: 15.6 10*3/uL — ABNORMAL HIGH (ref 3.6–11.0)

## 2013-07-14 LAB — COMPREHENSIVE METABOLIC PANEL
ANION GAP: 5 — AB (ref 7–16)
Albumin: 3.9 g/dL (ref 3.4–5.0)
Alkaline Phosphatase: 150 U/L — ABNORMAL HIGH
BUN: 13 mg/dL (ref 7–18)
Bilirubin,Total: 0.7 mg/dL (ref 0.2–1.0)
CALCIUM: 8.7 mg/dL (ref 8.5–10.1)
CHLORIDE: 104 mmol/L (ref 98–107)
Co2: 28 mmol/L (ref 21–32)
Creatinine: 1.26 mg/dL (ref 0.60–1.30)
EGFR (African American): 56 — ABNORMAL LOW
GFR CALC NON AF AMER: 48 — AB
Glucose: 111 mg/dL — ABNORMAL HIGH (ref 65–99)
Osmolality: 275 (ref 275–301)
POTASSIUM: 4 mmol/L (ref 3.5–5.1)
SGOT(AST): 41 U/L — ABNORMAL HIGH (ref 15–37)
SGPT (ALT): 47 U/L (ref 12–78)
SODIUM: 137 mmol/L (ref 136–145)
Total Protein: 8 g/dL (ref 6.4–8.2)

## 2013-07-14 LAB — RAPID INFLUENZA A&B ANTIGENS

## 2013-07-14 LAB — TROPONIN I: Troponin-I: <0.02 ng/mL

## 2013-07-14 NOTE — Telephone Encounter (Signed)
Ok refill? 

## 2013-07-19 ENCOUNTER — Encounter: Payer: Self-pay | Admitting: *Deleted

## 2013-07-20 ENCOUNTER — Ambulatory Visit (INDEPENDENT_AMBULATORY_CARE_PROVIDER_SITE_OTHER)
Admission: RE | Admit: 2013-07-20 | Discharge: 2013-07-20 | Disposition: A | Discharge status: Home / Self Care | Payer: Medicare Other | Source: Ambulatory Visit | Attending: Internal Medicine | Admitting: Internal Medicine

## 2013-07-20 ENCOUNTER — Encounter: Payer: Self-pay | Admitting: Internal Medicine

## 2013-07-20 ENCOUNTER — Ambulatory Visit (INDEPENDENT_AMBULATORY_CARE_PROVIDER_SITE_OTHER): Payer: Medicare Other | Admitting: Internal Medicine

## 2013-07-20 VITALS — BP 108/78 | HR 75 | Temp 98.4°F | Wt 203.0 lb

## 2013-07-20 DIAGNOSIS — Z720 Tobacco use: Secondary | ICD-10-CM

## 2013-07-20 DIAGNOSIS — IMO0001 Reserved for inherently not codable concepts without codable children: Secondary | ICD-10-CM

## 2013-07-20 DIAGNOSIS — E1165 Type 2 diabetes mellitus with hyperglycemia: Secondary | ICD-10-CM

## 2013-07-20 DIAGNOSIS — I10 Essential (primary) hypertension: Secondary | ICD-10-CM

## 2013-07-20 DIAGNOSIS — J189 Pneumonia, unspecified organism: Secondary | ICD-10-CM

## 2013-07-20 DIAGNOSIS — E039 Hypothyroidism, unspecified: Secondary | ICD-10-CM

## 2013-07-20 DIAGNOSIS — F17200 Nicotine dependence, unspecified, uncomplicated: Secondary | ICD-10-CM

## 2013-07-20 LAB — COMPREHENSIVE METABOLIC PANEL
ALBUMIN: 3.8 g/dL (ref 3.5–5.2)
ALT: 46 U/L — ABNORMAL HIGH (ref 0–35)
AST: 35 U/L (ref 0–37)
Alkaline Phosphatase: 139 U/L — ABNORMAL HIGH (ref 39–117)
BUN: 10 mg/dL (ref 6–23)
CALCIUM: 9.6 mg/dL (ref 8.4–10.5)
CO2: 25 mEq/L (ref 19–32)
Chloride: 108 mEq/L (ref 96–112)
Creatinine, Ser: 0.9 mg/dL (ref 0.4–1.2)
GFR: 68.17 mL/min (ref >60.00)
GLUCOSE: 117 mg/dL — AB (ref 70–99)
POTASSIUM: 5.1 meq/L (ref 3.5–5.1)
SODIUM: 143 meq/L (ref 135–145)
TOTAL PROTEIN: 7.4 g/dL (ref 6.0–8.3)
Total Bilirubin: 0.5 mg/dL (ref 0.3–1.2)

## 2013-07-20 LAB — TSH: TSH: 1.4 u[IU]/mL (ref 0.35–5.50)

## 2013-07-20 LAB — LIPID PANEL
CHOL/HDL RATIO: 6
Cholesterol: 152 mg/dL (ref 0–200)
HDL: 27.2 mg/dL — AB (ref >39.00)
LDL Cholesterol: 91 mg/dL (ref 0–99)
Triglycerides: 169 mg/dL — ABNORMAL HIGH (ref 0.0–149.0)
VLDL: 33.8 mg/dL (ref 0.0–40.0)

## 2013-07-20 LAB — CBC WITH DIFFERENTIAL/PLATELET
Basophils Absolute: 0 10*3/uL (ref 0.0–0.1)
Basophils Relative: 0.3 % (ref 0.0–3.0)
EOS PCT: 2.2 % (ref 0.0–5.0)
Eosinophils Absolute: 0.2 10*3/uL (ref 0.0–0.7)
HEMATOCRIT: 38.1 % (ref 36.0–46.0)
Hemoglobin: 12.3 g/dL (ref 12.0–15.0)
Lymphocytes Relative: 18.2 % (ref 12.0–46.0)
Lymphs Abs: 2.1 10*3/uL (ref 0.7–4.0)
MCHC: 32.2 g/dL (ref 30.0–36.0)
MCV: 91.7 fl (ref 78.0–100.0)
MONOS PCT: 8.1 % (ref 3.0–12.0)
Monocytes Absolute: 0.9 10*3/uL (ref 0.1–1.0)
NEUTROS PCT: 71.2 % (ref 43.0–77.0)
Neutro Abs: 8.1 10*3/uL — ABNORMAL HIGH (ref 1.4–7.7)
PLATELETS: 537 10*3/uL — AB (ref 150.0–400.0)
RBC: 4.16 Mil/uL (ref 3.87–5.11)
RDW: 15.1 % — ABNORMAL HIGH (ref 11.5–14.6)
WBC: 11.4 10*3/uL — AB (ref 4.5–10.5)

## 2013-07-20 LAB — HEMOGLOBIN A1C: HEMOGLOBIN A1C: 6.8 % — AB (ref 4.6–6.5)

## 2013-07-20 MED ORDER — DOXYCYCLINE HYCLATE 100 MG PO CAPS: 100.0000 mg | ORAL_CAPSULE | Freq: Two times a day (BID) | ORAL | Status: DC

## 2013-07-20 MED ORDER — ALPRAZOLAM 2 MG PO TABS: ORAL_TABLET | ORAL | Status: DC

## 2013-07-20 MED ORDER — ZOLPIDEM TARTRATE 10 MG PO TABS: ORAL_TABLET | ORAL | Status: DC

## 2013-07-20 NOTE — Assessment & Plan Note (Signed)
Will check TSH with labs today. Continue Levothyroxine. 

## 2013-07-20 NOTE — Progress Notes (Signed)
Subjective:    Patient ID: Alice Simmons, female    DOB: Oct 04, 1955, 58 y.o.   MRN: [id number]  HPI 58YO female presents for hospital follow up. Seen in ED at [facility] 1/30 with fever 104F and cough. Flu test negative. CXR showed pneumonia. Treated with Azithromycin 250mg  daily x 4 days. Continues to have cough productive of purulent mucous. Occasional subjective fever. Pt notes shortness of breath, fatigue.   Review of Systems  Constitutional: Positive for fever and fatigue. Negative for chills and unexpected weight change.  HENT: Negative for congestion, ear discharge, ear pain, facial swelling, hearing loss, mouth sores, nosebleeds, postnasal drip, rhinorrhea, sinus pressure, sneezing, sore throat, tinnitus, trouble swallowing and voice change.   Eyes: Negative for pain, discharge, redness and visual disturbance.  Respiratory: Positive for cough and shortness of breath. Negative for chest tightness, wheezing and stridor.   Cardiovascular: Negative for chest pain, palpitations and leg swelling.  Musculoskeletal: Negative for arthralgias, myalgias, neck pain and neck stiffness.  Skin: Negative for color change and rash.  Neurological: Negative for dizziness, weakness, light-headedness and headaches.  Hematological: Negative for adenopathy.       Objective:    BP 108/78  Pulse 75  Temp(Src) 98.4 F (36.9 C) (Oral)  Wt 203 lb (92.08 kg)  SpO2 94% Physical Exam  Constitutional: She is oriented to person, place, and time. She appears well-developed and well-nourished. No distress.  HENT:  Head: Normocephalic and atraumatic.  Right Ear: External ear normal.  Left Ear: External ear normal.  Nose: Nose normal.  Mouth/Throat: Oropharynx is clear and moist. No oropharyngeal exudate.  Eyes: Conjunctivae are normal. Pupils are equal, round, and reactive to light. Right eye exhibits no discharge. Left eye exhibits no discharge. No scleral icterus.  Neck: Normal range of motion. Neck  supple. No tracheal deviation present. No thyromegaly present.  Cardiovascular: Normal rate, regular rhythm, normal heart sounds and intact distal pulses.  Exam reveals no gallop and no friction rub.   No murmur heard. Pulmonary/Chest: Effort normal. No accessory muscle usage. Not tachypneic. No respiratory distress. She has no decreased breath sounds. She has no wheezes. She has rhonchi in the right lower field. She has no rales. She exhibits no tenderness.  Musculoskeletal: Normal range of motion. She exhibits no edema and no tenderness.  Lymphadenopathy:    She has no cervical adenopathy.  Neurological: She is alert and oriented to person, place, and time. No cranial nerve deficit. She exhibits normal muscle tone. Coordination normal.  Skin: Skin is warm and dry. No rash noted. She is not diaphoretic. No erythema. No pallor.  Psychiatric: She has a normal mood and affect. Her behavior is normal. Judgment and thought content normal.          Assessment & Plan:   Problem List Items Addressed This Visit   CAP (community acquired pneumonia) - Primary     Recently diagnosed with CAP at [facility] and treated with Azithromycin 250mg  po daily x 4 days. No improvement in symptoms. Exam today remarkable for RLL crackles. Will repeat CXR. Will check CBC. Start Doxycycline 100mg  po bid. Follow up 2 weeks and prn.    Relevant Medications      doxycycline (VIBRAMYCIN) 100 MG capsule   Other Relevant Orders      CBC with Differential      DG Chest 2 View      Bordetella pertussis antibody   Hypertension      BP Readings from Last 3 Encounters:  07/20/13  108/78  06/23/13 108/60  05/01/13 138/80   BP well controlled on current medications. Will continue.    Relevant Orders      Comprehensive metabolic panel      Lipid Profile   Hypothyroidism     Will check TSH with labs today. Continue Levothyroxine.    Relevant Orders      TSH   Tobacco abuse     Encouraged smoking cessation.    Type  II or unspecified type diabetes mellitus without mention of complication, uncontrolled      Lab Results  Component Value Date   HGBA1C 6.5 04/27/2013   Will recheck A1c with labs today. Continue Metformin.    Relevant Orders      Hemoglobin A1c       Return in about 2 days (around 07/22/2013) for Recheck.

## 2013-07-20 NOTE — Assessment & Plan Note (Signed)
Recently diagnosed with CAP at [facility] and treated with Azithromycin 250mg  po daily x 4 days. No improvement in symptoms. Exam today remarkable for RLL crackles. Will repeat CXR. Will check CBC. Start Doxycycline 100mg  po bid. Follow up 2 weeks and prn.

## 2013-07-20 NOTE — Assessment & Plan Note (Signed)
Encouraged smoking cessation 

## 2013-07-20 NOTE — Progress Notes (Signed)
Pre-visit discussion using our clinic review tool. No additional management support is needed unless otherwise documented below in the visit note.  

## 2013-07-20 NOTE — Assessment & Plan Note (Signed)
BP Readings from Last 3 Encounters:  07/20/13 108/78  06/23/13 108/60  05/01/13 138/80   BP well controlled on current medications. Will continue.

## 2013-07-20 NOTE — Assessment & Plan Note (Signed)
Lab Results  Component Value Date   HGBA1C 6.5 04/27/2013   Will recheck A1c with labs today. Continue Metformin.

## 2013-07-23 LAB — BORDETELLA PERTUSSIS ANTIBODY
B PERTUSSIS IGA AB, QUANT: 0 U/mL (ref <=1.1)
B PERTUSSIS IGG AB, QUANT: 1.5 U/mL — AB (ref 0.0–0.9)
B PERTUSSIS IGM AB, QUANT: 0 U/mL (ref <=1.1)

## 2013-07-25 LAB — BORDETELLA PERTUSSIS, IGG BY IB (RFLX)
B pertussis, IgG: POSITIVE
B. pertussis IgG IBA PT100: UNDETERMINED
B. pertussis IgG IBA PT: POSITIVE

## 2013-08-03 ENCOUNTER — Ambulatory Visit: Payer: Self-pay | Admitting: Internal Medicine

## 2013-08-10 ENCOUNTER — Other Ambulatory Visit: Payer: Self-pay | Admitting: Internal Medicine

## 2013-08-16 ENCOUNTER — Other Ambulatory Visit: Payer: Self-pay | Admitting: Adult Health

## 2013-08-16 NOTE — Telephone Encounter (Signed)
Ok refill? 

## 2013-08-17 ENCOUNTER — Other Ambulatory Visit: Payer: Self-pay | Admitting: Internal Medicine

## 2013-08-17 ENCOUNTER — Encounter: Payer: Self-pay | Admitting: Emergency Medicine

## 2013-08-17 ENCOUNTER — Ambulatory Visit (INDEPENDENT_AMBULATORY_CARE_PROVIDER_SITE_OTHER): Payer: Medicare Other | Admitting: Internal Medicine

## 2013-08-17 ENCOUNTER — Encounter: Payer: Self-pay | Admitting: *Deleted

## 2013-08-17 ENCOUNTER — Encounter: Payer: Self-pay | Admitting: Internal Medicine

## 2013-08-17 VITALS — BP 122/82 | HR 78 | Temp 98.9°F

## 2013-08-17 DIAGNOSIS — IMO0001 Reserved for inherently not codable concepts without codable children: Secondary | ICD-10-CM

## 2013-08-17 DIAGNOSIS — E785 Hyperlipidemia, unspecified: Secondary | ICD-10-CM

## 2013-08-17 DIAGNOSIS — F5104 Psychophysiologic insomnia: Secondary | ICD-10-CM

## 2013-08-17 DIAGNOSIS — G4700 Insomnia, unspecified: Secondary | ICD-10-CM

## 2013-08-17 DIAGNOSIS — E669 Obesity, unspecified: Secondary | ICD-10-CM

## 2013-08-17 DIAGNOSIS — G43909 Migraine, unspecified, not intractable, without status migrainosus: Secondary | ICD-10-CM

## 2013-08-17 DIAGNOSIS — Z1239 Encounter for other screening for malignant neoplasm of breast: Secondary | ICD-10-CM

## 2013-08-17 DIAGNOSIS — E1165 Type 2 diabetes mellitus with hyperglycemia: Principal | ICD-10-CM

## 2013-08-17 MED ORDER — METFORMIN HCL 500 MG PO TABS: 500.0000 mg | ORAL_TABLET | Freq: Two times a day (BID) | ORAL | Status: DC

## 2013-08-17 MED ORDER — TOPIRAMATE 25 MG PO TABS: 25.0000 mg | ORAL_TABLET | Freq: Two times a day (BID) | ORAL | Status: DC

## 2013-08-17 MED ORDER — ATORVASTATIN CALCIUM 20 MG PO TABS: 20.0000 mg | ORAL_TABLET | Freq: Every day | ORAL | Status: DC

## 2013-08-17 NOTE — Patient Instructions (Signed)
Stop Propranolol.  Start Topamax 25mg  twice daily.   Follow up in 2-4 weeks to recheck headaches.

## 2013-08-17 NOTE — Progress Notes (Signed)
Subjective:    Patient ID: Alice Simmons, female    DOB: 1955-09-24, 58 y.o.   MRN: [id number]  HPI 58YO female with h/o depression, hypertension, obesity, and diabetes presents for follow up recent pneumonia.  She recently stopped taking both atorvastatin and metformin because she did not realize these needed to be continued. She does not check BG. She is trying to follow healthier diet and lift weights for exercise.  She notes recent increase in diffuse aching headaches. Occur several times per week. Taking up to 6 Tylenol at times for pain with minimal improvement. In the past, had improvement with headaches using Propranolol.  Sleeping poorly. Only 2-3 hours per night. Taking Ambien and Tylenol PM with no changes. Very sedentary during the day, taking frequent naps. Does not leave home.  Review of Systems  Constitutional: Positive for fatigue. Negative for fever, chills, appetite change and unexpected weight change.  HENT: Negative for congestion, ear pain, sinus pressure, sore throat, trouble swallowing and voice change.   Eyes: Negative for visual disturbance.  Respiratory: Negative for cough, shortness of breath, wheezing and stridor.   Cardiovascular: Negative for chest pain, palpitations and leg swelling.  Gastrointestinal: Negative for nausea, vomiting, abdominal pain, diarrhea, constipation, blood in stool, abdominal distention and anal bleeding.  Genitourinary: Negative for dysuria and flank pain.  Musculoskeletal: Negative for arthralgias, gait problem, myalgias and neck pain.  Skin: Negative for color change and rash.  Neurological: Positive for headaches. Negative for dizziness.  Hematological: Negative for adenopathy. Does not bruise/bleed easily.  Psychiatric/Behavioral: Positive for sleep disturbance and dysphoric mood. Negative for suicidal ideas. The patient is nervous/anxious.        Objective:    BP 122/82  Pulse 78  Temp(Src) 98.9 F (37.2 C) (Oral)   SpO2 92% Physical Exam  Constitutional: She is oriented to person, place, and time. She appears well-developed and well-nourished. No distress.  HENT:  Head: Normocephalic and atraumatic.  Right Ear: External ear normal.  Left Ear: External ear normal.  Nose: Nose normal.  Mouth/Throat: Oropharynx is clear and moist. No oropharyngeal exudate.  Eyes: Conjunctivae are normal. Pupils are equal, round, and reactive to light. Right eye exhibits no discharge. Left eye exhibits no discharge. No scleral icterus.  Neck: Normal range of motion. Neck supple. No tracheal deviation present. No thyromegaly present.  Cardiovascular: Normal rate, regular rhythm, normal heart sounds and intact distal pulses.  Exam reveals no gallop and no friction rub.   No murmur heard. Pulmonary/Chest: Effort normal and breath sounds normal. No accessory muscle usage. Not tachypneic. No respiratory distress. She has no decreased breath sounds. She has no wheezes. She has no rhonchi. She has no rales. She exhibits no tenderness.  Musculoskeletal: Normal range of motion. She exhibits no edema and no tenderness.  Lymphadenopathy:    She has no cervical adenopathy.  Neurological: She is alert and oriented to person, place, and time. No cranial nerve deficit. She exhibits normal muscle tone. Coordination normal.  Skin: Skin is warm and dry. No rash noted. She is not diaphoretic. No erythema. No pallor.  Psychiatric: Her speech is normal and behavior is normal. Judgment and thought content normal. Her mood appears anxious. Cognition and memory are normal.          Assessment & Plan:   Problem List Items Addressed This Visit   Chronic insomnia     Symptoms of chronic insomnia. No improvement with ambien. Discouraged excessive use of Tylenol PM. Encouraged her to be more  active during the day and avoid napping. Offered referral to sleep specialist. She declines.    Migraine headache     Recent worsening of symptoms. Will  try stopping Propranolol and change to Topamax starting at 25mg  po bid. Follow up 2-4 weeks.    Relevant Medications      atorvastatin (LIPITOR) tablet      topiramate (TOPAMAX) tablet   Obesity      Wt Readings from Last 3 Encounters:  07/20/13 203 lb (92.08 kg)  06/23/13 206 lb 8 oz (93.668 kg)  05/01/13 204 lb (92.534 kg)   Encouraged her to exercise during daytime and limit intake of processed carbohydrates.    Relevant Medications      metFORMIN (GLUCOPHAGE) tablet   Other and unspecified hyperlipidemia   Screening for breast cancer   Relevant Orders      MM Digital Screening   Type II or unspecified type diabetes mellitus without mention of complication, uncontrolled - Primary      Lab Results  Component Value Date   HGBA1C 6.8* 07/20/2013   Encouraged her to continue metformin. Will plan recheck A1c in 10/2013.    Relevant Orders      Comprehensive metabolic panel      Hemoglobin A1c      Lipid panel      Microalbumin / creatinine urine ratio       Return in about 4 weeks (around 09/14/2013) for Follow up headaches.

## 2013-08-17 NOTE — Assessment & Plan Note (Signed)
Recent worsening of symptoms. Will try stopping Propranolol and change to Topamax starting at 25mg  po bid. Follow up 2-4 weeks.

## 2013-08-17 NOTE — Progress Notes (Signed)
Pre visit review using our clinic review tool, if applicable. No additional management support is needed unless otherwise documented below in the visit note. 

## 2013-08-17 NOTE — Assessment & Plan Note (Signed)
Wt Readings from Last 3 Encounters:  07/20/13 203 lb (92.08 kg)  06/23/13 206 lb 8 oz (93.668 kg)  05/01/13 204 lb (92.534 kg)   Encouraged her to exercise during daytime and limit intake of processed carbohydrates.

## 2013-08-17 NOTE — Assessment & Plan Note (Signed)
Lab Results  Component Value Date   HGBA1C 6.8* 07/20/2013   Encouraged her to continue metformin. Will plan recheck A1c in 10/2013.

## 2013-08-17 NOTE — Assessment & Plan Note (Signed)
Symptoms of chronic insomnia. No improvement with ambien. Discouraged excessive use of Tylenol PM. Encouraged her to be more active during the day and avoid napping. Offered referral to sleep specialist. She declines.

## 2013-08-18 ENCOUNTER — Telehealth: Payer: Self-pay | Admitting: Internal Medicine

## 2013-08-18 NOTE — Telephone Encounter (Signed)
Relevant patient education mailed to patient.  

## 2013-09-03 ENCOUNTER — Other Ambulatory Visit: Payer: Self-pay | Admitting: Internal Medicine

## 2013-09-13 ENCOUNTER — Other Ambulatory Visit: Payer: Self-pay | Admitting: Internal Medicine

## 2013-09-13 NOTE — Telephone Encounter (Signed)
Refill

## 2013-09-14 ENCOUNTER — Other Ambulatory Visit (INDEPENDENT_AMBULATORY_CARE_PROVIDER_SITE_OTHER): Payer: Medicare Other

## 2013-09-14 DIAGNOSIS — E1165 Type 2 diabetes mellitus with hyperglycemia: Principal | ICD-10-CM

## 2013-09-14 DIAGNOSIS — IMO0001 Reserved for inherently not codable concepts without codable children: Secondary | ICD-10-CM

## 2013-09-14 LAB — LIPID PANEL
CHOLESTEROL: 142 mg/dL (ref 0–200)
HDL: 30.7 mg/dL — ABNORMAL LOW (ref >39.00)
LDL CALC: 69 mg/dL (ref 0–99)
Total CHOL/HDL Ratio: 5
Triglycerides: 213 mg/dL — ABNORMAL HIGH (ref 0.0–149.0)
VLDL: 42.6 mg/dL — ABNORMAL HIGH (ref 0.0–40.0)

## 2013-09-14 LAB — COMPREHENSIVE METABOLIC PANEL
ALBUMIN: 4.1 g/dL (ref 3.5–5.2)
ALK PHOS: 98 U/L (ref 39–117)
ALT: 27 U/L (ref 0–35)
AST: 21 U/L (ref 0–37)
BUN: 12 mg/dL (ref 6–23)
CALCIUM: 9.3 mg/dL (ref 8.4–10.5)
CHLORIDE: 105 meq/L (ref 96–112)
CO2: 25 mEq/L (ref 19–32)
Creatinine, Ser: 0.9 mg/dL (ref 0.4–1.2)
GFR: 66.44 mL/min (ref >60.00)
GLUCOSE: 121 mg/dL — AB (ref 70–99)
POTASSIUM: 3.9 meq/L (ref 3.5–5.1)
SODIUM: 140 meq/L (ref 135–145)
TOTAL PROTEIN: 7.4 g/dL (ref 6.0–8.3)
Total Bilirubin: 0.3 mg/dL (ref 0.3–1.2)

## 2013-09-14 LAB — MICROALBUMIN / CREATININE URINE RATIO
CREATININE, U: 123.4 mg/dL
MICROALB UR: 0.4 mg/dL (ref 0.0–1.9)
MICROALB/CREAT RATIO: 0.3 mg/g (ref 0.0–30.0)

## 2013-09-14 LAB — HEMOGLOBIN A1C: HEMOGLOBIN A1C: 6.6 % — AB (ref 4.6–6.5)

## 2013-09-17 ENCOUNTER — Other Ambulatory Visit: Payer: Self-pay | Admitting: Internal Medicine

## 2013-09-19 ENCOUNTER — Encounter: Payer: Self-pay | Admitting: Internal Medicine

## 2013-09-19 ENCOUNTER — Ambulatory Visit (INDEPENDENT_AMBULATORY_CARE_PROVIDER_SITE_OTHER): Payer: Medicare Other | Admitting: Internal Medicine

## 2013-09-19 VITALS — BP 112/78 | HR 79 | Temp 98.7°F | Wt 201.0 lb

## 2013-09-19 DIAGNOSIS — F329 Major depressive disorder, single episode, unspecified: Secondary | ICD-10-CM

## 2013-09-19 DIAGNOSIS — F3289 Other specified depressive episodes: Secondary | ICD-10-CM

## 2013-09-19 DIAGNOSIS — N644 Mastodynia: Secondary | ICD-10-CM

## 2013-09-19 DIAGNOSIS — F32A Depression, unspecified: Secondary | ICD-10-CM

## 2013-09-19 NOTE — Assessment & Plan Note (Signed)
Bilateral breast pain. No palpable abnormalities. Reviewed mammogram from 2014 which was normal. Will order repeat bilateral diagnostic mammogram. Followup 4 weeks.

## 2013-09-19 NOTE — Assessment & Plan Note (Signed)
Worsening symptoms of depression. Pt declines referral for counseling. She does not want to make changes to medications. She is not suicidal. Pilar Plate discussion today about need for counseling and ongoing treatment.

## 2013-09-19 NOTE — Progress Notes (Signed)
Pre visit review using our clinic review tool, if applicable. No additional management support is needed unless otherwise documented below in the visit note. 

## 2013-09-19 NOTE — Progress Notes (Signed)
Subjective:    Patient ID: Alice Simmons, female    DOB: 1956-01-21, 58 y.o.   MRN: [id number]  HPI 58YO female with depression, DM, HTN, HT, obesity presents for follow up.  Breast pain - throbbing pain bilateral breasts left>right. Ongoing for 31months. No trauma to breasts. Has had breast pain in the past. Was told she needed breast reduction. Feels lumps throughout breast.  Depression - worsening symptoms of depression. Describes her marriage as without love. Has sought counseling before but did not find this helpful. Feels hopeless. Minimal support from her daughter.   Review of Systems  Constitutional: Negative for fever, chills, appetite change, fatigue and unexpected weight change.  HENT: Negative for congestion, ear pain, sinus pressure, sore throat, trouble swallowing and voice change.   Eyes: Negative for visual disturbance.  Respiratory: Negative for cough, shortness of breath, wheezing and stridor.   Cardiovascular: Negative for chest pain, palpitations and leg swelling.  Gastrointestinal: Negative for nausea, vomiting, abdominal pain, diarrhea, constipation, blood in stool, abdominal distention and anal bleeding.  Genitourinary: Negative for dysuria and flank pain.  Musculoskeletal: Negative for arthralgias, gait problem, myalgias and neck pain.  Skin: Negative for color change and rash.  Neurological: Negative for dizziness and headaches.  Hematological: Negative for adenopathy. Does not bruise/bleed easily.  Psychiatric/Behavioral: Positive for dysphoric mood. Negative for suicidal ideas and sleep disturbance. The patient is nervous/anxious.        Objective:    BP 112/78  Pulse 79  Temp(Src) 98.7 F (37.1 C) (Oral)  Wt 201 lb (91.173 kg)  SpO2 95% Physical Exam  Constitutional: She is oriented to person, place, and time. She appears well-developed and well-nourished. No distress.  HENT:  Head: Normocephalic and atraumatic.  Right Ear: External ear normal.    Left Ear: External ear normal.  Nose: Nose normal.  Mouth/Throat: Oropharynx is clear and moist. No oropharyngeal exudate.  Eyes: Conjunctivae are normal. Pupils are equal, round, and reactive to light. Right eye exhibits no discharge. Left eye exhibits no discharge. No scleral icterus.  Neck: Normal range of motion. Neck supple. No tracheal deviation present. No thyromegaly present.  Cardiovascular: Normal rate, regular rhythm, normal heart sounds and intact distal pulses.  Exam reveals no gallop and no friction rub.   No murmur heard. Pulmonary/Chest: Effort normal and breath sounds normal. No accessory muscle usage. Not tachypneic. No respiratory distress. She has no decreased breath sounds. She has no wheezes. She has no rhonchi. She has no rales. She exhibits no tenderness. Right breast exhibits no inverted nipple, no mass, no nipple discharge, no skin change and no tenderness. Left breast exhibits no inverted nipple, no mass, no nipple discharge, no skin change and no tenderness. Breasts are symmetrical.  Musculoskeletal: Normal range of motion. She exhibits no edema and no tenderness.  Lymphadenopathy:    She has no cervical adenopathy.  Neurological: She is alert and oriented to person, place, and time. No cranial nerve deficit. She exhibits normal muscle tone. Coordination normal.  Skin: Skin is warm and dry. No rash noted. She is not diaphoretic. No erythema. No pallor.  Psychiatric: Her speech is normal and behavior is normal. Judgment and thought content normal. She exhibits a depressed mood. She expresses no suicidal ideation. She expresses no suicidal plans.          Assessment & Plan:   Problem List Items Addressed This Visit   Breast pain in female - Primary     Bilateral breast pain. No palpable abnormalities.  Reviewed mammogram from 2014 which was normal. Will order repeat bilateral diagnostic mammogram. Followup 4 weeks.    Relevant Orders      MM Digital Diagnostic  Bilat   Depression     Worsening symptoms of depression. Pt declines referral for counseling. She does not want to make changes to medications. She is not suicidal. Pilar Plate discussion today about need for counseling and ongoing treatment.        Return in about 3 months (around 12/19/2013) for Recheck of Diabetes.

## 2013-09-20 ENCOUNTER — Telehealth: Payer: Self-pay | Admitting: Internal Medicine

## 2013-09-20 NOTE — Telephone Encounter (Signed)
Relevant patient education assigned to patient using Emmi. ° °

## 2013-09-26 ENCOUNTER — Ambulatory Visit (INDEPENDENT_AMBULATORY_CARE_PROVIDER_SITE_OTHER): Payer: Medicare Other | Admitting: Adult Health

## 2013-09-26 ENCOUNTER — Encounter: Payer: Self-pay | Admitting: Adult Health

## 2013-09-26 VITALS — BP 127/78 | HR 75 | Temp 99.3°F | Resp 16 | Wt 206.0 lb

## 2013-09-26 DIAGNOSIS — R05 Cough: Secondary | ICD-10-CM

## 2013-09-26 DIAGNOSIS — R509 Fever, unspecified: Secondary | ICD-10-CM

## 2013-09-26 DIAGNOSIS — R059 Cough, unspecified: Secondary | ICD-10-CM | POA: Insufficient documentation

## 2013-09-26 LAB — POCT INFLUENZA A/B
Influenza A, POC: NEGATIVE
Influenza B, POC: NEGATIVE

## 2013-09-26 MED ORDER — AZITHROMYCIN 250 MG PO TABS: ORAL_TABLET | ORAL | Status: DC

## 2013-09-26 MED ORDER — GUAIFENESIN-CODEINE 100-10 MG/5ML PO SOLN: 5.0000 mL | Freq: Three times a day (TID) | ORAL | Status: DC | PRN

## 2013-09-26 NOTE — Progress Notes (Signed)
Pre visit review using our clinic review tool, if applicable. No additional management support is needed unless otherwise documented below in the visit note. 

## 2013-09-26 NOTE — Progress Notes (Signed)
   Subjective:    Patient ID: Alice Simmons, female    DOB: 1955/11/07, 58 y.o.   MRN: [id number]  HPI  Patient presents to clinic with cough, general bodyaches, fever. Symptoms started about 4 days ago. She has been taking Tylenol and Robitussin. Denies shortness of breath.  Current Outpatient Prescriptions on File Prior to Visit  Medication Sig Dispense Refill  . alprazolam (XANAX) 2 MG tablet TAKE 1 TABLET BY MOUTH TWICE DAILY AS NEEDED  60 tablet  3  . amLODipine (NORVASC) 10 MG tablet TAKE 1 TABLET BY MOUTH EVERY DAY  30 tablet  5  . atorvastatin (LIPITOR) 20 MG tablet Take 1 tablet (20 mg total) by mouth daily.  90 tablet  3  . fluticasone (FLONASE) 50 MCG/ACT nasal spray Place 2 sprays into the nose daily.  16 g  6  . ibuprofen (ADVIL,MOTRIN) 800 MG tablet Take 1 tablet (800 mg total) by mouth every 8 (eight) hours as needed for pain.  60 tablet  0  . labetalol (NORMODYNE) 300 MG tablet TAKE 1 TABLET BY MOUTH TWICE DAILY  180 tablet  0  . levothyroxine (SYNTHROID, LEVOTHROID) 100 MCG tablet TAKE 1 TABLET BY MOUTH EVERY DAY  90 tablet  3  . losartan (COZAAR) 50 MG tablet TAKE 1 TABLET BY MOUTH EVERY DAY  30 tablet  5  . metFORMIN (GLUCOPHAGE) 500 MG tablet Take 1 tablet (500 mg total) by mouth 2 (two) times daily with a meal.  180 tablet  3  . omeprazole (PRILOSEC) 20 MG capsule TAKE 1 CAPSULE BY MOUTH TWICE DAILY  60 capsule  5  . PARoxetine (PAXIL) 30 MG tablet TAKE 1 TABLET BY MOUTH TWICE DAILY  180 tablet  1  . tiZANidine (ZANAFLEX) 4 MG tablet TAKE 1 TABLET BY MOUTH THREE TIMES DAILY AS NEEDED FOR MUSCLE SPASMS.  90 tablet  0  . topiramate (TOPAMAX) 25 MG tablet Take 1 tablet (25 mg total) by mouth 2 (two) times daily.  60 tablet  3  . zolpidem (AMBIEN) 10 MG tablet TAKE 1 TABLET BY MOUTH EVERY NIGHT AT BEDTIME  30 tablet  3   No current facility-administered medications on file prior to visit.      Review of Systems  Constitutional: Positive for fever and chills.  HENT:  Positive for congestion, postnasal drip, rhinorrhea, sinus pressure and sneezing. Negative for sore throat.   Respiratory: Positive for cough.   Musculoskeletal:       Body aches  All other systems reviewed and are negative.      Objective:   Physical Exam  Constitutional: She is oriented to person, place, and time.  Acutely ill  HENT:  Head: Normocephalic and atraumatic.  Right Ear: External ear normal.  Left Ear: External ear normal.  Mouth/Throat: No oropharyngeal exudate.  Cardiovascular: Normal rate, regular rhythm and normal heart sounds.  Exam reveals no gallop.   No murmur heard. Pulmonary/Chest: Effort normal and breath sounds normal. No respiratory distress. She has no wheezes. She has no rales.  Lymphadenopathy:    She has cervical adenopathy.  Neurological: She is alert and oriented to person, place, and time.  Psychiatric: She has a normal mood and affect. Her behavior is normal. Judgment and thought content normal.      Assessment & Plan:   1. Fever, unspecified Negative for influenza. - POCT Influenza A/B  2. Cough Severe cough. This has been keeping her up at night. Suspect bronchitis. Start Azithromycin and Robitussin AC

## 2013-09-26 NOTE — Patient Instructions (Addendum)
  Robitussin AC for severe cough. This contains codeine and may make you sleepy. Do not drive while taking this medication.  Start azithromycin 2 tablets today then one tablet daily for the next 4 days.   TREATMENT FOR ADDITIONAL SYMPTOMS:   1. Decongestant: Sudafed (NOT IF HIGH BLOOD PRESSURE)  2. Nose Sprays: Saline nasal spray, Can use Afrin or Neosynephrine, but no more than 3 days  3. Cough Suppressants: Strong prescription - Robitussin AC 4. Expectorant: Liquify Secretions (Guaifenesin) - this is included in the Robitussin AC 5. Breathe moist air: Humidifier, Vaporizer, or steam from shower  10. No work or school until no fever for 24 hrs WITH NO Tylenol or Ibuprofen.  11. Wash hands and cover mouth with cough

## 2013-10-05 ENCOUNTER — Encounter: Payer: Self-pay | Admitting: Internal Medicine

## 2013-10-05 NOTE — Telephone Encounter (Signed)
Requesting a change of dose on topamax Rx. Please advise.

## 2013-10-09 ENCOUNTER — Encounter: Payer: Self-pay | Admitting: Internal Medicine

## 2013-10-17 ENCOUNTER — Ambulatory Visit: Payer: Self-pay | Admitting: Internal Medicine

## 2013-10-18 ENCOUNTER — Encounter: Payer: Self-pay | Admitting: Internal Medicine

## 2013-11-13 ENCOUNTER — Telehealth: Payer: Self-pay | Admitting: Internal Medicine

## 2013-11-13 NOTE — Telephone Encounter (Signed)
Pt's husband states that she refuses to be seen at the ED, he would like an appt with you so you can talk her into going.  I advised him that we do not have the proper testing needed for her to be evaluated but he insisted for me to pass the message to you.

## 2013-11-13 NOTE — Telephone Encounter (Signed)
No. She needs an urgent evaluation in the ED with imaging. She may have had a stroke or have bleeding or infection causing symptoms.

## 2013-11-13 NOTE — Telephone Encounter (Signed)
This would be new for her. She should be evaluated in the emergency room if she is having a seizure to try to determine if there is any underlying cause of seizure activity. We should set up a 10min follow up in the next few weeks, after evaluation complete.

## 2013-11-13 NOTE — Telephone Encounter (Signed)
Advised pt's husband, he stated that they feel that the seizures are stress related.

## 2013-11-13 NOTE — Telephone Encounter (Signed)
Please advise when you would like to fit her in

## 2013-11-13 NOTE — Telephone Encounter (Signed)
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

## 2013-11-15 ENCOUNTER — Encounter: Payer: Self-pay | Admitting: Internal Medicine

## 2013-11-22 ENCOUNTER — Other Ambulatory Visit: Payer: Self-pay | Admitting: Internal Medicine

## 2013-11-22 NOTE — Telephone Encounter (Signed)
Refill

## 2013-12-05 ENCOUNTER — Other Ambulatory Visit: Payer: Self-pay | Admitting: Internal Medicine

## 2013-12-05 NOTE — Telephone Encounter (Signed)
Okay to refill? 

## 2013-12-10 LAB — COMPREHENSIVE METABOLIC PANEL
ALBUMIN: 3.8 g/dL (ref 3.4–5.0)
ANION GAP: 6 — AB (ref 7–16)
Alkaline Phosphatase: 106 U/L
BUN: 13 mg/dL (ref 7–18)
Bilirubin,Total: 0.3 mg/dL (ref 0.2–1.0)
CHLORIDE: 108 mmol/L — AB (ref 98–107)
CREATININE: 0.84 mg/dL (ref 0.60–1.30)
Calcium, Total: 9.2 mg/dL (ref 8.5–10.1)
Co2: 27 mmol/L (ref 21–32)
EGFR (African American): >60
Glucose: 81 mg/dL (ref 65–99)
Osmolality: 280 (ref 275–301)
Potassium: 3.9 mmol/L (ref 3.5–5.1)
SGOT(AST): 21 U/L (ref 15–37)
SGPT (ALT): 32 U/L (ref 12–78)
Sodium: 141 mmol/L (ref 136–145)
Total Protein: 7.5 g/dL (ref 6.4–8.2)

## 2013-12-10 LAB — DRUG SCREEN, URINE
Amphetamines, Ur Screen: NEGATIVE (ref ?–1000)
BENZODIAZEPINE, UR SCRN: POSITIVE (ref ?–200)
Barbiturates, Ur Screen: NEGATIVE (ref ?–200)
CANNABINOID 50 NG, UR ~~LOC~~: NEGATIVE (ref ?–50)
COCAINE METABOLITE, UR ~~LOC~~: NEGATIVE (ref ?–300)
MDMA (Ecstasy)Ur Screen: POSITIVE (ref ?–500)
Methadone, Ur Screen: NEGATIVE (ref ?–300)
OPIATE, UR SCREEN: NEGATIVE (ref ?–300)
Phencyclidine (PCP) Ur S: NEGATIVE (ref ?–25)
Tricyclic, Ur Screen: NEGATIVE (ref ?–1000)

## 2013-12-10 LAB — CBC
HCT: 38.5 % (ref 35.0–47.0)
HGB: 12.8 g/dL (ref 12.0–16.0)
MCH: 29.2 pg (ref 26.0–34.0)
MCHC: 33.3 g/dL (ref 32.0–36.0)
MCV: 88 fL (ref 80–100)
PLATELETS: 439 10*3/uL (ref 150–440)
RBC: 4.39 10*6/uL (ref 3.80–5.20)
RDW: 15 % — ABNORMAL HIGH (ref 11.5–14.5)
WBC: 8 10*3/uL (ref 3.6–11.0)

## 2013-12-10 LAB — SALICYLATE LEVEL: Salicylates, Serum: 3.8 mg/dL — ABNORMAL HIGH

## 2013-12-10 LAB — ETHANOL

## 2013-12-10 LAB — TSH: THYROID STIMULATING HORM: 1.2 u[IU]/mL

## 2013-12-10 LAB — ACETAMINOPHEN LEVEL: Acetaminophen: <2 ug/mL

## 2013-12-11 ENCOUNTER — Inpatient Hospital Stay: Payer: Self-pay | Admitting: Psychiatry

## 2013-12-17 ENCOUNTER — Other Ambulatory Visit: Payer: Self-pay | Admitting: Internal Medicine

## 2013-12-29 ENCOUNTER — Ambulatory Visit: Payer: Self-pay | Admitting: Internal Medicine

## 2013-12-29 ENCOUNTER — Encounter: Payer: Self-pay | Admitting: Internal Medicine

## 2013-12-29 ENCOUNTER — Ambulatory Visit (INDEPENDENT_AMBULATORY_CARE_PROVIDER_SITE_OTHER): Payer: Self-pay | Admitting: Internal Medicine

## 2013-12-29 VITALS — BP 102/60 | HR 81 | Temp 98.6°F | Ht 66.0 in

## 2013-12-29 DIAGNOSIS — Z7289 Other problems related to lifestyle: Secondary | ICD-10-CM

## 2013-12-29 DIAGNOSIS — E669 Obesity, unspecified: Secondary | ICD-10-CM

## 2013-12-29 DIAGNOSIS — E1165 Type 2 diabetes mellitus with hyperglycemia: Secondary | ICD-10-CM

## 2013-12-29 DIAGNOSIS — F315 Bipolar disorder, current episode depressed, severe, with psychotic features: Secondary | ICD-10-CM

## 2013-12-29 DIAGNOSIS — F489 Nonpsychotic mental disorder, unspecified: Secondary | ICD-10-CM

## 2013-12-29 DIAGNOSIS — IMO0002 Reserved for concepts with insufficient information to code with codable children: Secondary | ICD-10-CM

## 2013-12-29 DIAGNOSIS — IMO0001 Reserved for inherently not codable concepts without codable children: Secondary | ICD-10-CM

## 2013-12-29 DIAGNOSIS — I10 Essential (primary) hypertension: Secondary | ICD-10-CM

## 2013-12-29 LAB — COMPREHENSIVE METABOLIC PANEL
ALT: 29 U/L (ref 0–35)
AST: 35 U/L (ref 0–37)
Albumin: 3.9 g/dL (ref 3.5–5.2)
Alkaline Phosphatase: 93 U/L (ref 39–117)
BUN: 14 mg/dL (ref 6–23)
CO2: 25 meq/L (ref 19–32)
CREATININE: 0.8 mg/dL (ref 0.4–1.2)
Calcium: 9.4 mg/dL (ref 8.4–10.5)
Chloride: 108 mEq/L (ref 96–112)
GFR: 75.69 mL/min (ref >60.00)
GLUCOSE: 91 mg/dL (ref 70–99)
Potassium: 4.2 mEq/L (ref 3.5–5.1)
Sodium: 140 mEq/L (ref 135–145)
Total Bilirubin: 0.3 mg/dL (ref 0.2–1.2)
Total Protein: 6.7 g/dL (ref 6.0–8.3)

## 2013-12-29 LAB — LIPID PANEL
CHOLESTEROL: 206 mg/dL — AB (ref 0–200)
HDL: 26.6 mg/dL — ABNORMAL LOW (ref >39.00)
LDL Cholesterol: 111 mg/dL — ABNORMAL HIGH (ref 0–99)
NonHDL: 179.4
TRIGLYCERIDES: 341 mg/dL — AB (ref 0.0–149.0)
Total CHOL/HDL Ratio: 8
VLDL: 68.2 mg/dL — AB (ref 0.0–40.0)

## 2013-12-29 LAB — HEMOGLOBIN A1C: HEMOGLOBIN A1C: 6.7 % — AB (ref 4.6–6.5)

## 2013-12-29 MED ORDER — TOPIRAMATE 100 MG PO TABS: 200.0000 mg | ORAL_TABLET | Freq: Every day | ORAL | Status: DC

## 2013-12-29 MED ORDER — RISPERIDONE 3 MG PO TABS: 3.0000 mg | ORAL_TABLET | Freq: Every day | ORAL | Status: DC
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
## 2013-12-29 NOTE — Assessment & Plan Note (Signed)
Recent hospitalization for psychosis. Started on Topamax and Risperidal with some improvement, however we discussed at length the need for her to have ongoing psychiatric care. Will set up new referral to psychiatrist in [place].  Over 76min of which >50% spent in face-to-face contact with patient discussing plan of care

## 2013-12-29 NOTE — Assessment & Plan Note (Signed)
Wt Readings from Last 3 Encounters:  09/26/13 206 lb (93.441 kg)  09/19/13 201 lb (91.173 kg)  07/20/13 203 lb (92.08 kg)   There is no weight on file to calculate BMI. Encouraged healthy diet and exercise such as walking.

## 2013-12-29 NOTE — Progress Notes (Signed)
Pre visit review using our clinic review tool, if applicable. No additional management support is needed unless otherwise documented below in the visit note. 

## 2013-12-29 NOTE — Assessment & Plan Note (Signed)
Will check A1c with labs today. She has stopped metformin for unclear reasons. Will wait to see labs prior to restarting.

## 2013-12-29 NOTE — Patient Instructions (Signed)
We will set up referral to psychiatry.  Follow up here in 3 months or as needed.

## 2013-12-29 NOTE — Assessment & Plan Note (Signed)
BP Readings from Last 3 Encounters:  12/29/13 102/60  09/26/13 127/78  09/19/13 112/78   BP well controlled on current medications. Will check renal function with labs today.

## 2013-12-30 ENCOUNTER — Other Ambulatory Visit: Payer: Self-pay | Admitting: Internal Medicine

## 2014-01-01 NOTE — Telephone Encounter (Signed)
Last refill 6.11.15, last OV 7.17.15.  Please advise refills in Dr Derry Skill absence.

## 2014-01-01 NOTE — Telephone Encounter (Signed)
Rx called in to pharmacy. 

## 2014-01-01 NOTE — Telephone Encounter (Signed)
Given pts history and note with referral to psych, I would prefer Dr Gilford Rile to review and see if refills are appropriate.  She was just given risperdol.  Please forward to Dr Gilford Rile.  Let me know if any problems.

## 2014-01-03 ENCOUNTER — Other Ambulatory Visit: Payer: Self-pay | Admitting: Internal Medicine

## 2014-01-04 NOTE — Telephone Encounter (Signed)
Refill

## 2014-01-26 ENCOUNTER — Other Ambulatory Visit: Payer: Self-pay | Admitting: Internal Medicine

## 2014-01-30 ENCOUNTER — Other Ambulatory Visit: Payer: Self-pay | Admitting: Internal Medicine

## 2014-01-30 NOTE — Telephone Encounter (Signed)
Refill

## 2014-01-30 NOTE — Telephone Encounter (Signed)
We can refill this medication, however she needs to get future refills from her psychiatrist.

## 2014-02-06 ENCOUNTER — Telehealth: Payer: Self-pay | Admitting: *Deleted

## 2014-02-06 NOTE — Telephone Encounter (Signed)
Fax from pharmacy requesting Zolpidem 10mg  tablets.  Last refill 7.20.15, last OV 7.17.15.  Please advise refill

## 2014-02-11 ENCOUNTER — Other Ambulatory Visit: Payer: Self-pay | Admitting: Internal Medicine

## 2014-02-13 ENCOUNTER — Other Ambulatory Visit: Payer: Self-pay | Admitting: *Deleted

## 2014-02-13 MED ORDER — ZOLPIDEM TARTRATE 10 MG PO TABS: ORAL_TABLET | ORAL | Status: DC

## 2014-02-19 ENCOUNTER — Other Ambulatory Visit: Payer: Self-pay | Admitting: Internal Medicine

## 2014-02-22 ENCOUNTER — Other Ambulatory Visit: Payer: Self-pay | Admitting: Internal Medicine

## 2014-02-26 ENCOUNTER — Other Ambulatory Visit: Payer: Self-pay | Admitting: Internal Medicine

## 2014-02-26 ENCOUNTER — Emergency Department: Payer: Self-pay | Admitting: Emergency Medicine

## 2014-02-26 NOTE — Telephone Encounter (Signed)
Last refill note states to obtain refill from psychiatrist, refuse refill?

## 2014-03-02 ENCOUNTER — Other Ambulatory Visit: Payer: Self-pay | Admitting: Internal Medicine

## 2014-03-02 NOTE — Telephone Encounter (Signed)
Okay to refill? Last seen in July

## 2014-03-02 NOTE — Telephone Encounter (Signed)
Per Dr Gilford Rile, cancel this medication approval due to pt needing to receive this medication from her Psychologist

## 2014-03-05 ENCOUNTER — Telehealth: Payer: Self-pay | Admitting: Internal Medicine

## 2014-03-05 NOTE — Telephone Encounter (Signed)
Pt's husband states  "she does not do well with the psychologist, she gets worse when she has to bring up the things that makes her upset"  He also states that they cannot afford the $50 copay each month to see the psychologist.  Faythe Ghee to refill?

## 2014-03-05 NOTE — Telephone Encounter (Signed)
She must establish care with a psychiatrist. This is a high dose of Alprazolam, and she will have risk of seizure with sudden stop of medication. We can call in Alprazolam 1mg  po bid, with plan for her to taper down off this medication over the next month. She should take 1mg  bid x 2 weeks, then 0.5mg  po bid x2 weeks, then stop. #60tabs no refills.

## 2014-03-05 NOTE — Telephone Encounter (Signed)
Pt needs refill on Alprazolam 2 mg tab. Pt is completely out of meds. Please advise pt when ready.msn

## 2014-03-06 MED ORDER — ALPRAZOLAM 1 MG PO TABS: ORAL_TABLET | ORAL | Status: DC

## 2014-03-18 ENCOUNTER — Other Ambulatory Visit: Payer: Self-pay | Admitting: Internal Medicine

## 2014-03-28 ENCOUNTER — Other Ambulatory Visit: Payer: Self-pay | Admitting: Internal Medicine

## 2014-04-08 ENCOUNTER — Other Ambulatory Visit: Payer: Self-pay | Admitting: Internal Medicine

## 2014-04-09 NOTE — Telephone Encounter (Signed)
Last OV 7.17.15, last refill 9.1.15.  Please advise refill in Dr Derry Skill absence.

## 2014-04-10 NOTE — Telephone Encounter (Signed)
Needs to establish with psychiatry. NO further controlled drug refills.

## 2014-04-10 NOTE — Telephone Encounter (Signed)
Spoke with pt, she has not seen psychiatry.  She will call and schedule

## 2014-04-10 NOTE — Telephone Encounter (Signed)
Per Dr Thomes Dinning last note, pt referred to psychiatry.  She needs to get filled through psych.  If not seeing, then can send Dr Gilford Rile a message to see if willing to refill.

## 2014-04-10 NOTE — Telephone Encounter (Signed)
Noted.  Show to Dr Gilford Rile

## 2014-04-11 NOTE — Telephone Encounter (Signed)
Pt aware.

## 2014-04-12 ENCOUNTER — Telehealth: Payer: Self-pay

## 2014-04-12 NOTE — Telephone Encounter (Signed)
LVM for pt to call back.    RE: scheduling AWV with PA or NP if pt allows.

## 2014-05-01 ENCOUNTER — Other Ambulatory Visit: Payer: Self-pay | Admitting: Internal Medicine

## 2014-05-03 ENCOUNTER — Ambulatory Visit: Payer: Self-pay | Admitting: Internal Medicine

## 2014-05-06 ENCOUNTER — Other Ambulatory Visit: Payer: Self-pay | Admitting: Internal Medicine

## 2014-05-08 ENCOUNTER — Telehealth: Payer: Self-pay | Admitting: *Deleted

## 2014-05-08 NOTE — Telephone Encounter (Signed)
Received request and authorization for disclosure of protected health information to be sent to [facility] at [phone].  Sending request to health port.

## 2014-05-09 LAB — LIPID PANEL
Cholesterol: 239 mg/dL — AB (ref 0–200)
Cholesterol: 239 mg/dL — AB (ref 0–200)
HDL: 30 mg/dL — AB (ref 35–70)
HDL: 30 mg/dL — AB (ref 35–70)
LDL Cholesterol: 174 mg/dL
LDL Cholesterol: 174 mg/dL
LDl/HDL Ratio: 5.8
Triglycerides: 175 mg/dL — AB (ref 40–160)
Triglycerides: 175 mg/dL — AB (ref 40–160)

## 2014-05-09 LAB — BASIC METABOLIC PANEL
BUN: 13 mg/dL (ref 4–21)
CREATININE: 0.8 mg/dL (ref 0.5–1.1)
Glucose: 116 mg/dL
POTASSIUM: 4.2 mmol/L (ref 3.4–5.3)
Sodium: 140 mmol/L (ref 137–147)

## 2014-05-09 LAB — TSH: TSH: 0.39 u[IU]/mL — AB (ref 0.41–5.90)

## 2014-05-09 LAB — HEPATIC FUNCTION PANEL
ALT: 23 U/L (ref 7–35)
AST: 19 U/L (ref 13–35)
Alkaline Phosphatase: 84 U/L (ref 25–125)
Bilirubin, Total: 0.3 mg/dL

## 2014-05-09 LAB — CBC AND DIFFERENTIAL
HEMATOCRIT: 39 % (ref 36–46)
HEMOGLOBIN: 12.6 g/dL (ref 12.0–16.0)
Neutrophils Absolute: 67 /uL
Platelets: 417 10*3/uL — AB (ref 150–399)
WBC: 7.9 10*3/mL

## 2014-05-09 LAB — HEMOGLOBIN A1C
HEMOGLOBIN A1C: 6.2 % — AB (ref 4.0–6.0)
Hgb A1c MFr Bld: 6.2 % — AB (ref 4.0–6.0)

## 2014-05-15 LAB — HM MAMMOGRAPHY

## 2014-05-16 ENCOUNTER — Ambulatory Visit: Payer: Self-pay | Admitting: Internal Medicine

## 2014-05-18 ENCOUNTER — Ambulatory Visit: Payer: Self-pay | Admitting: Family Medicine

## 2014-05-18 LAB — HM MAMMOGRAPHY: HM MAMMO: ABNORMAL

## 2014-05-31 ENCOUNTER — Other Ambulatory Visit: Payer: Self-pay | Admitting: Internal Medicine

## 2014-06-28 ENCOUNTER — Other Ambulatory Visit: Payer: Self-pay | Admitting: Internal Medicine

## 2014-07-09 ENCOUNTER — Encounter (INDEPENDENT_AMBULATORY_CARE_PROVIDER_SITE_OTHER): Payer: Self-pay | Admitting: Ophthalmology

## 2014-07-12 ENCOUNTER — Encounter (INDEPENDENT_AMBULATORY_CARE_PROVIDER_SITE_OTHER): Payer: Medicare Other | Admitting: Ophthalmology

## 2014-07-12 DIAGNOSIS — H2513 Age-related nuclear cataract, bilateral: Secondary | ICD-10-CM

## 2014-07-12 DIAGNOSIS — H3531 Nonexudative age-related macular degeneration: Secondary | ICD-10-CM

## 2014-07-12 DIAGNOSIS — H319 Unspecified disorder of choroid: Secondary | ICD-10-CM

## 2014-07-12 DIAGNOSIS — H43813 Vitreous degeneration, bilateral: Secondary | ICD-10-CM

## 2014-07-12 DIAGNOSIS — I10 Essential (primary) hypertension: Secondary | ICD-10-CM

## 2014-07-13 ENCOUNTER — Encounter (INDEPENDENT_AMBULATORY_CARE_PROVIDER_SITE_OTHER): Payer: Self-pay | Admitting: Ophthalmology

## 2014-07-26 ENCOUNTER — Other Ambulatory Visit: Payer: Self-pay | Admitting: Internal Medicine

## 2014-08-13 ENCOUNTER — Other Ambulatory Visit: Payer: Self-pay | Admitting: Internal Medicine

## 2014-08-24 ENCOUNTER — Other Ambulatory Visit: Payer: Self-pay | Admitting: Internal Medicine

## 2014-09-05 ENCOUNTER — Other Ambulatory Visit: Payer: Self-pay | Admitting: Ophthalmology

## 2014-09-05 DIAGNOSIS — H5712 Ocular pain, left eye: Secondary | ICD-10-CM

## 2014-09-06 ENCOUNTER — Other Ambulatory Visit: Payer: Self-pay

## 2014-09-06 ENCOUNTER — Ambulatory Visit (INDEPENDENT_AMBULATORY_CARE_PROVIDER_SITE_OTHER): Payer: Medicare Other

## 2014-09-06 DIAGNOSIS — H5712 Ocular pain, left eye: Secondary | ICD-10-CM | POA: Diagnosis not present

## 2014-09-06 MED ORDER — IOHEXOL 300 MG/ML  SOLN: 75.0000 mL | Freq: Once | INTRAMUSCULAR | Status: AC | PRN

## 2014-09-07 ENCOUNTER — Ambulatory Visit: Payer: Self-pay | Admitting: Otolaryngology

## 2014-09-09 ENCOUNTER — Other Ambulatory Visit: Payer: Self-pay | Admitting: Internal Medicine

## 2014-09-10 NOTE — Telephone Encounter (Signed)
Okay to refill? Pt last seen in July 2015

## 2014-09-11 ENCOUNTER — Encounter: Payer: Self-pay | Admitting: *Deleted

## 2014-09-11 ENCOUNTER — Other Ambulatory Visit: Payer: Self-pay | Admitting: Internal Medicine

## 2014-09-11 NOTE — Telephone Encounter (Signed)
Sent patient a mychart message notifying her to schedule an appt asap. Pt last seen in July 2015.

## 2014-09-13 ENCOUNTER — Other Ambulatory Visit: Payer: Self-pay | Admitting: Internal Medicine

## 2014-09-22 ENCOUNTER — Other Ambulatory Visit: Payer: Self-pay | Admitting: Internal Medicine

## 2014-09-24 NOTE — Telephone Encounter (Signed)
Refill? Last visit 12/29/13. Pt was sent mychart 09/11/14 which was read, but no appointment has been scheduled.
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
## 2014-10-10 ENCOUNTER — Other Ambulatory Visit: Payer: Self-pay | Admitting: Internal Medicine

## 2014-10-15 DIAGNOSIS — H47099 Other disorders of optic nerve, not elsewhere classified, unspecified eye: Secondary | ICD-10-CM | POA: Diagnosis not present

## 2014-10-15 DIAGNOSIS — D333 Benign neoplasm of cranial nerves: Secondary | ICD-10-CM | POA: Diagnosis not present

## 2014-10-25 ENCOUNTER — Other Ambulatory Visit: Payer: Self-pay | Admitting: Family Medicine

## 2014-10-25 DIAGNOSIS — D320 Benign neoplasm of cerebral meninges: Secondary | ICD-10-CM | POA: Diagnosis not present

## 2014-10-25 DIAGNOSIS — M546 Pain in thoracic spine: Secondary | ICD-10-CM | POA: Diagnosis not present

## 2014-10-25 DIAGNOSIS — M5417 Radiculopathy, lumbosacral region: Secondary | ICD-10-CM | POA: Diagnosis not present

## 2014-10-25 DIAGNOSIS — M549 Dorsalgia, unspecified: Secondary | ICD-10-CM

## 2014-10-25 DIAGNOSIS — H9313 Tinnitus, bilateral: Secondary | ICD-10-CM | POA: Diagnosis not present

## 2014-10-26 ENCOUNTER — Ambulatory Visit
Admission: RE | Admit: 2014-10-26 | Discharge: 2014-10-26 | Disposition: A | Discharge status: Home / Self Care | Payer: Medicare Other | Source: Ambulatory Visit | Attending: Family Medicine | Admitting: Family Medicine

## 2014-10-26 DIAGNOSIS — M47816 Spondylosis without myelopathy or radiculopathy, lumbar region: Secondary | ICD-10-CM | POA: Diagnosis not present

## 2014-10-26 DIAGNOSIS — K567 Ileus, unspecified: Secondary | ICD-10-CM | POA: Diagnosis not present

## 2014-10-26 DIAGNOSIS — M549 Dorsalgia, unspecified: Secondary | ICD-10-CM | POA: Insufficient documentation

## 2014-10-26 DIAGNOSIS — I700 Atherosclerosis of aorta: Secondary | ICD-10-CM | POA: Insufficient documentation

## 2014-11-01 DIAGNOSIS — H9193 Unspecified hearing loss, bilateral: Secondary | ICD-10-CM | POA: Diagnosis not present

## 2014-11-01 DIAGNOSIS — H9313 Tinnitus, bilateral: Secondary | ICD-10-CM | POA: Diagnosis not present

## 2014-11-02 DIAGNOSIS — H47099 Other disorders of optic nerve, not elsewhere classified, unspecified eye: Secondary | ICD-10-CM | POA: Diagnosis not present

## 2014-11-02 DIAGNOSIS — R220 Localized swelling, mass and lump, head: Secondary | ICD-10-CM | POA: Diagnosis not present

## 2014-11-02 DIAGNOSIS — D333 Benign neoplasm of cranial nerves: Secondary | ICD-10-CM | POA: Diagnosis not present

## 2014-11-02 DIAGNOSIS — R42 Dizziness and giddiness: Secondary | ICD-10-CM | POA: Diagnosis not present

## 2014-11-08 DIAGNOSIS — M545 Low back pain: Secondary | ICD-10-CM | POA: Diagnosis not present

## 2014-11-08 DIAGNOSIS — R202 Paresthesia of skin: Secondary | ICD-10-CM | POA: Diagnosis not present

## 2014-11-09 ENCOUNTER — Other Ambulatory Visit: Payer: Self-pay | Admitting: Neurology

## 2014-11-09 DIAGNOSIS — M545 Low back pain: Secondary | ICD-10-CM

## 2014-11-09 DIAGNOSIS — R200 Anesthesia of skin: Secondary | ICD-10-CM

## 2014-11-09 DIAGNOSIS — R202 Paresthesia of skin: Principal | ICD-10-CM

## 2014-11-13 DIAGNOSIS — D329 Benign neoplasm of meninges, unspecified: Secondary | ICD-10-CM | POA: Diagnosis not present

## 2014-11-13 DIAGNOSIS — H47099 Other disorders of optic nerve, not elsewhere classified, unspecified eye: Secondary | ICD-10-CM | POA: Diagnosis not present

## 2014-11-24 ENCOUNTER — Other Ambulatory Visit: Payer: Self-pay | Admitting: Family Medicine

## 2014-11-24 DIAGNOSIS — G43009 Migraine without aura, not intractable, without status migrainosus: Secondary | ICD-10-CM

## 2014-11-27 DIAGNOSIS — H47099 Other disorders of optic nerve, not elsewhere classified, unspecified eye: Secondary | ICD-10-CM | POA: Diagnosis not present

## 2014-11-27 DIAGNOSIS — D329 Benign neoplasm of meninges, unspecified: Secondary | ICD-10-CM | POA: Diagnosis not present

## 2014-11-28 ENCOUNTER — Telehealth: Payer: Self-pay | Admitting: Family Medicine

## 2014-11-28 DIAGNOSIS — D320 Benign neoplasm of cerebral meninges: Secondary | ICD-10-CM

## 2014-11-28 DIAGNOSIS — IMO0002 Reserved for concepts with insufficient information to code with codable children: Secondary | ICD-10-CM

## 2014-11-28 DIAGNOSIS — E039 Hypothyroidism, unspecified: Secondary | ICD-10-CM

## 2014-11-28 DIAGNOSIS — I10 Essential (primary) hypertension: Secondary | ICD-10-CM

## 2014-11-28 DIAGNOSIS — E559 Vitamin D deficiency, unspecified: Secondary | ICD-10-CM | POA: Insufficient documentation

## 2014-11-28 DIAGNOSIS — E785 Hyperlipidemia, unspecified: Secondary | ICD-10-CM | POA: Insufficient documentation

## 2014-11-28 DIAGNOSIS — E1165 Type 2 diabetes mellitus with hyperglycemia: Secondary | ICD-10-CM

## 2014-11-28 NOTE — Telephone Encounter (Signed)
Patient's husband was informed and stated he will pick it up on Friday morning

## 2014-11-28 NOTE — Telephone Encounter (Signed)
Labs ordered and ready to be picked up and drawn fasting for at least 8hrs.

## 2014-11-28 NOTE — Telephone Encounter (Signed)
PT HUSBAND CALLED ASKING FOR LAB ORDER TO HAVE LABS BEFORE HIS WIFE APPT ON THE 22ND. PLEASE CALL [phone]

## 2014-11-29 ENCOUNTER — Other Ambulatory Visit: Payer: Self-pay | Admitting: Neurology

## 2014-11-29 DIAGNOSIS — R202 Paresthesia of skin: Principal | ICD-10-CM

## 2014-11-29 DIAGNOSIS — M545 Low back pain: Secondary | ICD-10-CM

## 2014-11-29 DIAGNOSIS — R200 Anesthesia of skin: Secondary | ICD-10-CM

## 2014-11-30 ENCOUNTER — Inpatient Hospital Stay
Admission: RE | Admit: 2014-11-30 | Discharge: 2014-11-30 | Disposition: A | Discharge status: Home / Self Care | Payer: Self-pay | Source: Ambulatory Visit | Attending: Neurology | Admitting: Neurology

## 2014-11-30 DIAGNOSIS — E039 Hypothyroidism, unspecified: Secondary | ICD-10-CM | POA: Diagnosis not present

## 2014-11-30 DIAGNOSIS — M791 Myalgia: Secondary | ICD-10-CM | POA: Diagnosis not present

## 2014-11-30 DIAGNOSIS — J0100 Acute maxillary sinusitis, unspecified: Secondary | ICD-10-CM | POA: Diagnosis not present

## 2014-11-30 DIAGNOSIS — J029 Acute pharyngitis, unspecified: Secondary | ICD-10-CM | POA: Diagnosis not present

## 2014-11-30 DIAGNOSIS — E785 Hyperlipidemia, unspecified: Secondary | ICD-10-CM | POA: Diagnosis not present

## 2014-11-30 DIAGNOSIS — F17200 Nicotine dependence, unspecified, uncomplicated: Secondary | ICD-10-CM | POA: Diagnosis not present

## 2014-11-30 DIAGNOSIS — E1165 Type 2 diabetes mellitus with hyperglycemia: Secondary | ICD-10-CM | POA: Diagnosis not present

## 2014-11-30 DIAGNOSIS — I10 Essential (primary) hypertension: Secondary | ICD-10-CM | POA: Diagnosis not present

## 2014-12-01 LAB — CBC WITH DIFFERENTIAL/PLATELET
BASOS ABS: 0 10*3/uL (ref 0.0–0.2)
Basos: 0 %
EOS (ABSOLUTE): 0.1 10*3/uL (ref 0.0–0.4)
Eos: 2 %
HEMOGLOBIN: 13.2 g/dL (ref 11.1–15.9)
Hematocrit: 39.8 % (ref 34.0–46.6)
IMMATURE GRANS (ABS): 0 10*3/uL (ref 0.0–0.1)
IMMATURE GRANULOCYTES: 0 %
LYMPHS: 15 %
Lymphocytes Absolute: 1.4 10*3/uL (ref 0.7–3.1)
MCH: 30.3 pg (ref 26.6–33.0)
MCHC: 33.2 g/dL (ref 31.5–35.7)
MCV: 91 fL (ref 79–97)
Monocytes Absolute: 0.9 10*3/uL (ref 0.1–0.9)
Monocytes: 9 %
Neutrophils Absolute: 7 10*3/uL (ref 1.4–7.0)
Neutrophils: 74 %
PLATELETS: 368 10*3/uL (ref 150–379)
RBC: 4.36 x10E6/uL (ref 3.77–5.28)
RDW: 14 % (ref 12.3–15.4)
WBC: 9.4 10*3/uL (ref 3.4–10.8)

## 2014-12-01 LAB — COMPREHENSIVE METABOLIC PANEL
ALBUMIN: 4.4 g/dL (ref 3.5–5.5)
ALT: 45 IU/L — ABNORMAL HIGH (ref 0–32)
AST: 42 IU/L — ABNORMAL HIGH (ref 0–40)
Albumin/Globulin Ratio: 1.8 (ref 1.1–2.5)
Alkaline Phosphatase: 92 IU/L (ref 39–117)
BUN/Creatinine Ratio: 13 (ref 9–23)
BUN: 11 mg/dL (ref 6–24)
Bilirubin Total: 0.2 mg/dL (ref 0.0–1.2)
CO2: 21 mmol/L (ref 18–29)
Calcium: 9.2 mg/dL (ref 8.7–10.2)
Chloride: 104 mmol/L (ref 97–108)
Creatinine, Ser: 0.87 mg/dL (ref 0.57–1.00)
GFR calc Af Amer: 86 mL/min/{1.73_m2} (ref >59)
GFR calc non Af Amer: 75 mL/min/{1.73_m2} (ref >59)
GLOBULIN, TOTAL: 2.4 g/dL (ref 1.5–4.5)
GLUCOSE: 98 mg/dL (ref 65–99)
Potassium: 4.1 mmol/L (ref 3.5–5.2)
Sodium: 142 mmol/L (ref 134–144)
Total Protein: 6.8 g/dL (ref 6.0–8.5)

## 2014-12-01 LAB — TSH: TSH: 1.14 u[IU]/mL (ref 0.450–4.500)

## 2014-12-01 LAB — LIPID PANEL
CHOLESTEROL TOTAL: 237 mg/dL — AB (ref 100–199)
Chol/HDL Ratio: 6.2 ratio units — ABNORMAL HIGH (ref 0.0–4.4)
HDL: 38 mg/dL — AB (ref >39)
LDL CALC: 182 mg/dL — AB (ref 0–99)
TRIGLYCERIDES: 83 mg/dL (ref 0–149)
VLDL CHOLESTEROL CAL: 17 mg/dL (ref 5–40)

## 2014-12-01 LAB — HEMOGLOBIN A1C
Est. average glucose Bld gHb Est-mCnc: 126 mg/dL
Hgb A1c MFr Bld: 6 % — ABNORMAL HIGH (ref 4.8–5.6)

## 2014-12-01 LAB — VITAMIN D 25 HYDROXY (VIT D DEFICIENCY, FRACTURES): VIT D 25 HYDROXY: 22.2 ng/mL — AB (ref 30.0–100.0)

## 2014-12-03 ENCOUNTER — Other Ambulatory Visit: Payer: Self-pay | Admitting: Family Medicine

## 2014-12-03 ENCOUNTER — Ambulatory Visit
Admission: RE | Admit: 2014-12-03 | Discharge: 2014-12-03 | Disposition: A | Discharge status: Home / Self Care | Payer: Medicare Other | Source: Ambulatory Visit | Attending: Neurology | Admitting: Neurology

## 2014-12-03 DIAGNOSIS — M47816 Spondylosis without myelopathy or radiculopathy, lumbar region: Secondary | ICD-10-CM | POA: Diagnosis not present

## 2014-12-03 DIAGNOSIS — Z87828 Personal history of other (healed) physical injury and trauma: Secondary | ICD-10-CM | POA: Diagnosis not present

## 2014-12-03 DIAGNOSIS — M5136 Other intervertebral disc degeneration, lumbar region: Secondary | ICD-10-CM | POA: Diagnosis not present

## 2014-12-03 DIAGNOSIS — R202 Paresthesia of skin: Principal | ICD-10-CM

## 2014-12-03 DIAGNOSIS — R200 Anesthesia of skin: Secondary | ICD-10-CM

## 2014-12-03 DIAGNOSIS — M545 Low back pain: Secondary | ICD-10-CM

## 2014-12-05 ENCOUNTER — Encounter: Payer: Self-pay | Admitting: Family Medicine

## 2014-12-05 ENCOUNTER — Ambulatory Visit (INDEPENDENT_AMBULATORY_CARE_PROVIDER_SITE_OTHER): Payer: Medicare Other | Admitting: Family Medicine

## 2014-12-05 VITALS — BP 127/80 | HR 65 | Temp 98.6°F | Ht 67.5 in | Wt 150.5 lb

## 2014-12-05 DIAGNOSIS — E039 Hypothyroidism, unspecified: Secondary | ICD-10-CM | POA: Diagnosis not present

## 2014-12-05 DIAGNOSIS — M545 Low back pain: Secondary | ICD-10-CM | POA: Diagnosis not present

## 2014-12-05 DIAGNOSIS — E119 Type 2 diabetes mellitus without complications: Secondary | ICD-10-CM

## 2014-12-05 DIAGNOSIS — J9801 Acute bronchospasm: Secondary | ICD-10-CM | POA: Diagnosis not present

## 2014-12-05 DIAGNOSIS — E785 Hyperlipidemia, unspecified: Secondary | ICD-10-CM

## 2014-12-05 DIAGNOSIS — I10 Essential (primary) hypertension: Secondary | ICD-10-CM

## 2014-12-05 DIAGNOSIS — R202 Paresthesia of skin: Secondary | ICD-10-CM | POA: Diagnosis not present

## 2014-12-05 MED ORDER — PREDNISONE 10 MG (21) PO TBPK: ORAL_TABLET | ORAL | Status: DC

## 2014-12-05 NOTE — Progress Notes (Signed)
Name: Alice Simmons   MRN: [id number]    DOB: [birth year]   Date:12/05/2014       Progress Note  Subjective  Chief Complaint  Chief Complaint  Patient presents with  . Follow-up    Labwork results, cough    HPI  Cough: Patient is here today with concerns regarding the following symptoms congestion, post nasal drip and non productive cough that started weeks ago.  Associated with fatigue and malaise. Not associated with fevers, chills, nausea, vomiting, abdominal pain, changes in bowel movements, rash. Has tried the following home remedies: Went to urgent care and was prescribed Zpak, Virtussin, Lidocaine 2% oral solution. Overall she feels tired, numbness and tingling in her legs and arms, going for MRI and nerve conduction study with Neurologist.   Patient is here for routine follow up of Diabetes Type II.  Current diabetes medication regimen includes lifestyle changes alone . Patient is adhering to treatment plan as instructed. Overall the patient feels that their blood glucose is well controlled. Checking blood glucose zero times a day.  Last Hba1c 6.0% on 11/30/14. Related medical issues include hypothyroidism, last TSH 1.140, currently on levothyroxine 155mcg one a day. Related medical issues included uncontroled Hyperlipidemia and controled hypertension on Losartan 50mg , Labetalol 300mg , Amlodipine 10mg .       Past Medical History  Diagnosis Date  . Hypertension   . Anxiety   . Obesity (BMI 30.0-34.9)   . Hypertension, benign   . Depression, controlled   . Pain of both breasts   . Diabetes mellitus type II, uncontrolled     History  Substance Use Topics  . Smoking status: Smoker, Current Status Unknown -- 0.50 packs/day    Types: Cigarettes  . Smokeless tobacco: Never Used  . Alcohol Use: 0.0 oz/week    0 Standard drinks or equivalent per week     Comment: occasional     Current outpatient prescriptions:  .  amLODipine (NORVASC) 10 MG tablet, TAKE 1 TABLET BY MOUTH  EVERY DAY, Disp: 30 tablet, Rfl: 4 .  fentaNYL (DURAGESIC - DOSED MCG/HR) 12 MCG/HR, APPLY 1 PATCH TO SKIN Q 72 H, Disp: , Rfl: 0 .  labetalol (NORMODYNE) 300 MG tablet, TAKE 1 TABLET BY MOUTH TWICE DAILY, Disp: 60 tablet, Rfl: 0 .  losartan (COZAAR) 50 MG tablet, TAKE 1 TABLET BY MOUTH EVERY DAY, Disp: 30 tablet, Rfl: 5 .  omeprazole (PRILOSEC) 20 MG capsule, TAKE 1 CAPSULE BY MOUTH TWICE DAILY, Disp: 60 capsule, Rfl: 5 .  PARoxetine (PAXIL) 30 MG tablet, TAKE 1 TABLET BY MOUTH TWICE DAILY, Disp: 60 tablet, Rfl: 0 .  topiramate (TOPAMAX) 100 MG tablet, TAKE 1 TABLET BY MOUTH TWICE DAILY, Disp: 60 tablet, Rfl: 3 .  zolpidem (AMBIEN) 10 MG tablet, TAKE 1 TABLET BY MOUTH EVERY NIGHT AT BEDTIME, Disp: 30 tablet, Rfl: 0 .  fluticasone (FLONASE) 50 MCG/ACT nasal spray, Place 2 sprays into the nose daily. (Patient not taking: Reported on 12/05/2014), Disp: 16 g, Rfl: 6 .  ibuprofen (ADVIL,MOTRIN) 800 MG tablet, Take 1 tablet (800 mg total) by mouth every 8 (eight) hours as needed for pain. (Patient not taking: Reported on 12/05/2014), Disp: 60 tablet, Rfl: 0 .  levothyroxine (SYNTHROID, LEVOTHROID) 100 MCG tablet, TAKE 1 TABLET BY MOUTH EVERY DAY (Patient not taking: Reported on 12/05/2014), Disp: 90 tablet, Rfl: 3 .  tiZANidine (ZANAFLEX) 4 MG tablet, TAKE 1 TABLET BY MOUTH THREE TIMES DAILY AS NEEDED FOR MUSCLE SPASMS. (Patient not taking: Reported on 12/05/2014), Disp: 90  tablet, Rfl: 0  Allergies  Allergen Reactions  . Aspirin Swelling  . Penicillins Swelling    ROS  10 Systems reviewed and is negative except as mentioned in HPI.   Objective  Filed Vitals:   12/05/14 0834  BP: 127/80  Pulse: 65  Temp: 98.6 F (37 C)  Height: 5' 7.5" (1.715 m)  Weight: 150 lb 8 oz (68.266 kg)  SpO2: 97%   Recent Results (from the past 2160 hour(s))  CBC with Differential/Platelet     Status: None   Collection Time: 11/30/14  9:15 AM  Result Value Ref Range   WBC 9.4 3.4 - 10.8 x10E3/uL   RBC 4.36  3.77 - 5.28 x10E6/uL   Hemoglobin 13.2 11.1 - 15.9 g/dL   Hematocrit 39.8 34.0 - 46.6 %   MCV 91 79 - 97 fL   MCH 30.3 26.6 - 33.0 pg   MCHC 33.2 31.5 - 35.7 g/dL   RDW 14.0 12.3 - 15.4 %   Platelets 368 150 - 379 x10E3/uL   NEUTROPHILS 74 %   Lymphs 15 %   Monocytes 9 %   Eos 2 %   Basos 0 %   Neutrophils Absolute 7.0 1.4 - 7.0 x10E3/uL   Lymphocytes Absolute 1.4 0.7 - 3.1 x10E3/uL   Monocytes Absolute 0.9 0.1 - 0.9 x10E3/uL   EOS (ABSOLUTE) 0.1 0.0 - 0.4 x10E3/uL   Basophils Absolute 0.0 0.0 - 0.2 x10E3/uL   Immature Granulocytes 0 %   Immature Grans (Abs) 0.0 0.0 - 0.1 x10E3/uL  Comprehensive metabolic panel     Status: Abnormal   Collection Time: 11/30/14  9:15 AM  Result Value Ref Range   Glucose 98 65 - 99 mg/dL   BUN 11 6 - 24 mg/dL   Creatinine, Ser 0.87 0.57 - 1.00 mg/dL   GFR calc non Af Amer 75 >59 mL/min/1.73   GFR calc Af Amer 86 >59 mL/min/1.73   BUN/Creatinine Ratio 13 9 - 23   Sodium 142 134 - 144 mmol/L   Potassium 4.1 3.5 - 5.2 mmol/L   Chloride 104 97 - 108 mmol/L   CO2 21 18 - 29 mmol/L   Calcium 9.2 8.7 - 10.2 mg/dL   Total Protein 6.8 6.0 - 8.5 g/dL   Albumin 4.4 3.5 - 5.5 g/dL   Globulin, Total 2.4 1.5 - 4.5 g/dL   Albumin/Globulin Ratio 1.8 1.1 - 2.5   Bilirubin Total 0.2 0.0 - 1.2 mg/dL   Alkaline Phosphatase 92 39 - 117 IU/L   AST 42 (H) 0 - 40 IU/L   ALT 45 (H) 0 - 32 IU/L  Lipid panel     Status: Abnormal   Collection Time: 11/30/14  9:15 AM  Result Value Ref Range   Cholesterol, Total 237 (H) 100 - 199 mg/dL   Triglycerides 83 0 - 149 mg/dL   HDL 38 (L) >39 mg/dL    Comment: According to ATP-III Guidelines, HDL-C >59 mg/dL is considered a negative risk factor for CHD.    VLDL Cholesterol Cal 17 5 - 40 mg/dL   LDL Calculated 182 (H) 0 - 99 mg/dL   Chol/HDL Ratio 6.2 (H) 0.0 - 4.4 ratio units    Comment:                                   T. Chol/HDL Ratio  Men  Women                                1/2 Avg.Risk  3.4    3.3                                   Avg.Risk  5.0    4.4                                2X Avg.Risk  9.6    7.1                                3X Avg.Risk 23.4   11.0   TSH     Status: None   Collection Time: 11/30/14  9:15 AM  Result Value Ref Range   TSH 1.140 0.450 - 4.500 uIU/mL  Vit D  25 hydroxy (rtn osteoporosis monitoring)     Status: Abnormal   Collection Time: 11/30/14  9:15 AM  Result Value Ref Range   Vit D, 25-Hydroxy 22.2 (L) 30.0 - 100.0 ng/mL    Comment: Vitamin D deficiency has been defined by the [place] and an Endocrine Society practice guideline as a level of serum 25-OH vitamin D less than 20 ng/mL (1,2). The Endocrine Society went on to further define vitamin D insufficiency as a level between 21 and 29 ng/mL (2). 1. IOM (Institute of Medicine). 2010. Dietary reference    intakes for calcium and D. [place]: The    [employer]. 2. Holick MF, Binkley [place], Bischoff-Ferrari HA, et al.    Evaluation, treatment, and prevention of vitamin D    deficiency: an Endocrine Society clinical practice    guideline. JCEM. 2011 Jul; 96(7):1911-30.   HgB A1c     Status: Abnormal   Collection Time: 11/30/14  9:15 AM  Result Value Ref Range   Hgb A1c MFr Bld 6.0 (H) 4.8 - 5.6 %    Comment:          Pre-diabetes: 5.7 - 6.4          Diabetes: >6.4          Glycemic control for adults with diabetes: <7.0    Est. average glucose Bld gHb Est-mCnc 126 mg/dL    Physical Exam  Constitutional: Patient appears well-developed and well-nourished. In no acute distress but does appear to be fatigued from acute illness. HEENT:  - Head: Normocephalic and atraumatic.  - Ears: RIGHT TM bulging with minimal clear exudate, LEFT TM bulging with minimal clear exudate.  - Nose: Nasal mucosa boggy and congested.  - Mouth/Throat: Oropharynx is moist with slight erythema of bilateral tonsils without hypertrophy or exudates. Post nasal  drainage present.  - Eyes: Conjunctivae clear, EOM movements normal. PERRLA. No scleral icterus.  Neck: Normal range of motion. Neck supple. No JVD present. No thyromegaly present. No local lymphadenopathy. Cardiovascular: Regular rate, regular rhythm with no murmurs heard.  Pulmonary/Chest: Effort normal and breath sounds clear with some scattered rhonchi at upper airways. Musculoskeletal: Normal range of motion bilateral UE and LE, no joint effusions. No palpable step off over spine. Skin: Skin is warm and dry. No rash noted. Psychiatric: Patient has a normal mood and affect. Behavior  is normal in office today. Judgment and thought content normal in office today.   Assessment & Plan 1. Hyperlipidemia LDL goal <100 She has declined statin therapy.  2. Diabetes mellitus type 2, controlled, without complications Well controled.  3. Hypothyroidism, unspecified hypothyroidism type Continue levothyroxine 162mcg.  4. Hypertension goal BP (blood pressure) < 140/90 Well controled.   5. Bronchospasm Start Pred taper.   - predniSONE (STERAPRED UNI-PAK 21 TAB) 10 MG (21) TBPK tablet; Use as directed in a 6 day taper PredPak  Dispense: 21 tablet; Refill: 0

## 2014-12-06 DIAGNOSIS — R202 Paresthesia of skin: Secondary | ICD-10-CM | POA: Diagnosis not present

## 2014-12-06 DIAGNOSIS — M545 Low back pain: Secondary | ICD-10-CM | POA: Diagnosis not present

## 2014-12-26 ENCOUNTER — Telehealth: Payer: Self-pay | Admitting: Family Medicine

## 2014-12-26 NOTE — Telephone Encounter (Signed)
Patient is needing a referral for 6 month follow-up diagnostic mammogram at [facility].

## 2014-12-27 ENCOUNTER — Other Ambulatory Visit: Payer: Self-pay | Admitting: Family Medicine

## 2014-12-27 DIAGNOSIS — N6320 Unspecified lump in the left breast, unspecified quadrant: Secondary | ICD-10-CM

## 2014-12-27 NOTE — Telephone Encounter (Signed)
Diagnostic Left breast mammogram and ultrasound ordered please have patient call [place] and schedule appointment.   Mammogram 05/18/14 shows benign mass 3 o'clock position left breast, f/u diagnostic mammogram and [place] of left breast 6 months ordered.

## 2014-12-27 NOTE — Telephone Encounter (Signed)
Patient was informed via husband

## 2014-12-28 ENCOUNTER — Other Ambulatory Visit: Payer: Self-pay | Admitting: Family Medicine

## 2014-12-28 DIAGNOSIS — H25012 Cortical age-related cataract, left eye: Secondary | ICD-10-CM | POA: Diagnosis not present

## 2014-12-28 DIAGNOSIS — H3531 Nonexudative age-related macular degeneration: Secondary | ICD-10-CM | POA: Diagnosis not present

## 2014-12-28 DIAGNOSIS — H2512 Age-related nuclear cataract, left eye: Secondary | ICD-10-CM | POA: Diagnosis not present

## 2014-12-28 DIAGNOSIS — H25011 Cortical age-related cataract, right eye: Secondary | ICD-10-CM | POA: Diagnosis not present

## 2014-12-28 DIAGNOSIS — H2511 Age-related nuclear cataract, right eye: Secondary | ICD-10-CM | POA: Diagnosis not present

## 2015-01-02 ENCOUNTER — Other Ambulatory Visit: Payer: Self-pay | Admitting: Family Medicine

## 2015-01-02 ENCOUNTER — Ambulatory Visit
Admission: RE | Admit: 2015-01-02 | Discharge: 2015-01-02 | Disposition: A | Discharge status: Home / Self Care | Payer: Medicare Other | Source: Ambulatory Visit | Attending: Family Medicine | Admitting: Family Medicine

## 2015-01-02 DIAGNOSIS — N63 Unspecified lump in breast: Secondary | ICD-10-CM | POA: Diagnosis not present

## 2015-01-02 DIAGNOSIS — N6320 Unspecified lump in the left breast, unspecified quadrant: Secondary | ICD-10-CM

## 2015-01-02 DIAGNOSIS — I10 Essential (primary) hypertension: Secondary | ICD-10-CM

## 2015-01-03 DIAGNOSIS — D320 Benign neoplasm of cerebral meninges: Secondary | ICD-10-CM | POA: Diagnosis not present

## 2015-01-03 DIAGNOSIS — H40013 Open angle with borderline findings, low risk, bilateral: Secondary | ICD-10-CM | POA: Diagnosis not present

## 2015-01-08 DIAGNOSIS — H2512 Age-related nuclear cataract, left eye: Secondary | ICD-10-CM | POA: Diagnosis not present

## 2015-01-10 DIAGNOSIS — M545 Low back pain: Secondary | ICD-10-CM | POA: Diagnosis not present

## 2015-01-10 DIAGNOSIS — M5416 Radiculopathy, lumbar region: Secondary | ICD-10-CM | POA: Diagnosis not present

## 2015-01-10 DIAGNOSIS — Z79899 Other long term (current) drug therapy: Secondary | ICD-10-CM | POA: Diagnosis not present

## 2015-01-10 DIAGNOSIS — G8929 Other chronic pain: Secondary | ICD-10-CM | POA: Diagnosis not present

## 2015-01-30 DIAGNOSIS — M47816 Spondylosis without myelopathy or radiculopathy, lumbar region: Secondary | ICD-10-CM | POA: Diagnosis not present

## 2015-01-30 DIAGNOSIS — M5387 Other specified dorsopathies, lumbosacral region: Secondary | ICD-10-CM | POA: Diagnosis not present

## 2015-01-30 DIAGNOSIS — M545 Low back pain: Secondary | ICD-10-CM | POA: Diagnosis not present

## 2015-01-30 DIAGNOSIS — M488X6 Other specified spondylopathies, lumbar region: Secondary | ICD-10-CM | POA: Diagnosis not present

## 2015-02-07 DIAGNOSIS — H2511 Age-related nuclear cataract, right eye: Secondary | ICD-10-CM | POA: Diagnosis not present

## 2015-02-07 DIAGNOSIS — H25011 Cortical age-related cataract, right eye: Secondary | ICD-10-CM | POA: Diagnosis not present

## 2015-02-14 ENCOUNTER — Encounter: Payer: Self-pay | Admitting: Family Medicine

## 2015-03-11 ENCOUNTER — Other Ambulatory Visit: Payer: Self-pay | Admitting: Family Medicine

## 2015-03-18 ENCOUNTER — Other Ambulatory Visit: Payer: Self-pay | Admitting: Family Medicine

## 2015-03-19 DIAGNOSIS — H2511 Age-related nuclear cataract, right eye: Secondary | ICD-10-CM | POA: Diagnosis not present

## 2015-03-20 ENCOUNTER — Ambulatory Visit: Payer: Medicare Other | Admitting: Pain Medicine

## 2015-03-21 ENCOUNTER — Encounter: Payer: Self-pay | Admitting: Pain Medicine

## 2015-03-21 ENCOUNTER — Ambulatory Visit: Payer: Medicare Other | Attending: Pain Medicine | Admitting: Pain Medicine

## 2015-03-21 VITALS — BP 114/56 | HR 55 | Temp 98.0°F | Resp 16 | Ht 62.0 in | Wt 140.0 lb

## 2015-03-21 DIAGNOSIS — M4726 Other spondylosis with radiculopathy, lumbar region: Secondary | ICD-10-CM

## 2015-03-21 DIAGNOSIS — M47816 Spondylosis without myelopathy or radiculopathy, lumbar region: Secondary | ICD-10-CM

## 2015-03-21 DIAGNOSIS — M5417 Radiculopathy, lumbosacral region: Secondary | ICD-10-CM

## 2015-03-21 DIAGNOSIS — M5416 Radiculopathy, lumbar region: Secondary | ICD-10-CM

## 2015-03-21 DIAGNOSIS — G8929 Other chronic pain: Secondary | ICD-10-CM

## 2015-03-21 DIAGNOSIS — I10 Essential (primary) hypertension: Secondary | ICD-10-CM | POA: Insufficient documentation

## 2015-03-21 DIAGNOSIS — F17210 Nicotine dependence, cigarettes, uncomplicated: Secondary | ICD-10-CM | POA: Insufficient documentation

## 2015-03-21 DIAGNOSIS — F419 Anxiety disorder, unspecified: Secondary | ICD-10-CM | POA: Diagnosis not present

## 2015-03-21 DIAGNOSIS — F1990 Other psychoactive substance use, unspecified, uncomplicated: Secondary | ICD-10-CM | POA: Diagnosis not present

## 2015-03-21 DIAGNOSIS — M549 Dorsalgia, unspecified: Secondary | ICD-10-CM | POA: Diagnosis present

## 2015-03-21 DIAGNOSIS — F1290 Cannabis use, unspecified, uncomplicated: Secondary | ICD-10-CM

## 2015-03-21 DIAGNOSIS — M545 Low back pain: Secondary | ICD-10-CM | POA: Diagnosis not present

## 2015-03-21 DIAGNOSIS — E785 Hyperlipidemia, unspecified: Secondary | ICD-10-CM | POA: Diagnosis not present

## 2015-03-21 DIAGNOSIS — G894 Chronic pain syndrome: Secondary | ICD-10-CM | POA: Insufficient documentation

## 2015-03-21 DIAGNOSIS — G43909 Migraine, unspecified, not intractable, without status migrainosus: Secondary | ICD-10-CM | POA: Insufficient documentation

## 2015-03-21 DIAGNOSIS — F1210 Cannabis abuse, uncomplicated: Secondary | ICD-10-CM | POA: Diagnosis not present

## 2015-03-21 DIAGNOSIS — M47896 Other spondylosis, lumbar region: Secondary | ICD-10-CM | POA: Diagnosis not present

## 2015-03-21 DIAGNOSIS — Z8669 Personal history of other diseases of the nervous system and sense organs: Secondary | ICD-10-CM

## 2015-03-21 DIAGNOSIS — M79604 Pain in right leg: Secondary | ICD-10-CM

## 2015-03-21 NOTE — Progress Notes (Signed)
Safety precautions to be maintained throughout the outpatient stay will include: orient to surroundings, keep bed in low position, maintain call bell within reach at all times, provide assistance with transfer out of bed and ambulation.  No prescription meds

## 2015-03-21 NOTE — Patient Instructions (Signed)
GENERAL RISKS AND COMPLICATIONS  What are the risk, side effects and possible complications? Generally speaking, most procedures are safe.  However, with any procedure there are risks, side effects, and the possibility of complications.  The risks and complications are dependent upon the sites that are lesioned, or the type of nerve block to be performed.  The closer the procedure is to the spine, the more serious the risks are.  Great care is taken when placing the radio frequency needles, block needles or lesioning probes, but sometimes complications can occur. 1. Infection: Any time there is an injection through the skin, there is a risk of infection.  This is why sterile conditions are used for these blocks.  There are four possible types of infection. 1. Localized skin infection. 2. Central Nervous System Infection-This can be in the form of Meningitis, which can be deadly. 3. Epidural Infections-This can be in the form of an epidural abscess, which can cause pressure inside of the spine, causing compression of the spinal cord with subsequent paralysis. This would require an emergency surgery to decompress, and there are no guarantees that the patient would recover from the paralysis. 4. Discitis-This is an infection of the intervertebral discs.  It occurs in about 1% of discography procedures.  It is difficult to treat and it may lead to surgery.        2. Pain: the needles have to go through skin and soft tissues, will cause soreness.       3. Damage to internal structures:  The nerves to be lesioned may be near blood vessels or    other nerves which can be potentially damaged.       4. Bleeding: Bleeding is more common if the patient is taking blood thinners such as  aspirin, Coumadin, Ticiid, Plavix, etc., or if he/she have some genetic predisposition  such as hemophilia. Bleeding into the spinal canal can cause compression of the spinal  cord with subsequent paralysis.  This would require an  emergency surgery to  decompress and there are no guarantees that the patient would recover from the  paralysis.       5. Pneumothorax:  Puncturing of a lung is a possibility, every time a needle is introduced in  the area of the chest or upper back.  Pneumothorax refers to free air around the  collapsed lung(s), inside of the thoracic cavity (chest cavity).  Another two possible  complications related to a similar event would include: Hemothorax and Chylothorax.   These are variations of the Pneumothorax, where instead of air around the collapsed  lung(s), you may have blood or chyle, respectively.       6. Spinal headaches: They may occur with any procedures in the area of the spine.       7. Persistent CSF (Cerebro-Spinal Fluid) leakage: This is a rare problem, but may occur  with prolonged intrathecal or epidural catheters either due to the formation of a fistulous  track or a dural tear.       8. Nerve damage: By working so close to the spinal cord, there is always a possibility of  nerve damage, which could be as serious as a permanent spinal cord injury with  paralysis.       9. Death:  Although rare, severe deadly allergic reactions known as "Anaphylactic  reaction" can occur to any of the medications used.      10. Worsening of the symptoms:  We can always make thing worse.    What are the chances of something like this happening? Chances of any of this occuring are extremely low.  By statistics, you have more of a chance of getting killed in a motor vehicle accident: while driving to the hospital than any of the above occurring .  Nevertheless, you should be aware that they are possibilities.  In general, it is similar to taking a shower.  Everybody knows that you can slip, hit your head and get killed.  Does that mean that you should not shower again?  Nevertheless always keep in mind that statistics do not mean anything if you happen to be on the wrong side of them.  Even if a procedure has a 1  (one) in a 1,000,000 (million) chance of going wrong, it you happen to be that one..Also, keep in mind that by statistics, you have more of a chance of having something go wrong when taking medications.  Who should not have this procedure? If you are on a blood thinning medication (e.g. Coumadin, Plavix, see list of "Blood Thinners"), or if you have an active infection going on, you should not have the procedure.  If you are taking any blood thinners, please inform your physician.  How should I prepare for this procedure?  Do not eat or drink anything at least six hours prior to the procedure.  Bring a driver with you .  It cannot be a taxi.  Come accompanied by an adult that can drive you back, and that is strong enough to help you if your legs get weak or numb from the local anesthetic.  Take all of your medicines the morning of the procedure with just enough water to swallow them.  If you have diabetes, make sure that you are scheduled to have your procedure done first thing in the morning, whenever possible.  If you have diabetes, take only half of your insulin dose and notify our nurse that you have done so as soon as you arrive at the clinic.  If you are diabetic, but only take blood sugar pills (oral hypoglycemic), then do not take them on the morning of your procedure.  You may take them after you have had the procedure.  Do not take aspirin or any aspirin-containing medications, at least eleven (11) days prior to the procedure.  They may prolong bleeding.  Wear loose fitting clothing that may be easy to take off and that you would not mind if it got stained with Betadine or blood.  Do not wear any jewelry or perfume  Remove any nail coloring.  It will interfere with some of our monitoring equipment.  NOTE: Remember that this is not meant to be interpreted as a complete list of all possible complications.  Unforeseen problems may occur.  BLOOD THINNERS The following drugs  contain aspirin or other products, which can cause increased bleeding during surgery and should not be taken for 2 weeks prior to and 1 week after surgery.  If you should need take something for relief of minor pain, you may take acetaminophen which is found in Tylenol,m Datril, Anacin-3 and Panadol. It is not blood thinner. The products listed below are.  Do not take any of the products listed below in addition to any listed on your instruction sheet.  A.P.C or A.P.C with Codeine Codeine Phosphate Capsules #3 Ibuprofen Ridaura  ABC compound Congesprin Imuran rimadil  Advil Cope Indocin Robaxisal  Alka-Seltzer Effervescent Pain Reliever and Antacid Coricidin or Coricidin-D  Indomethacin Rufen    Alka-Seltzer plus Cold Medicine Cosprin Ketoprofen S-A-C Tablets  Anacin Analgesic Tablets or Capsules Coumadin Korlgesic Salflex  Anacin Extra Strength Analgesic tablets or capsules CP-2 Tablets Lanoril Salicylate  Anaprox Cuprimine Capsules Levenox Salocol  Anexsia-D Dalteparin Magan Salsalate  Anodynos Darvon compound Magnesium Salicylate Sine-off  Ansaid Dasin Capsules Magsal Sodium Salicylate  Anturane Depen Capsules Marnal Soma  APF Arthritis pain formula Dewitt's Pills Measurin Stanback  Argesic Dia-Gesic Meclofenamic Sulfinpyrazone  Arthritis Bayer Timed Release Aspirin Diclofenac Meclomen Sulindac  Arthritis pain formula Anacin Dicumarol Medipren Supac  Analgesic (Safety coated) Arthralgen Diffunasal Mefanamic Suprofen  Arthritis Strength Bufferin Dihydrocodeine Mepro Compound Suprol  Arthropan liquid Dopirydamole Methcarbomol with Aspirin Synalgos  ASA tablets/Enseals Disalcid Micrainin Tagament  Ascriptin Doan's Midol Talwin  Ascriptin A/D Dolene Mobidin Tanderil  Ascriptin Extra Strength Dolobid Moblgesic Ticlid  Ascriptin with Codeine Doloprin or Doloprin with Codeine Momentum Tolectin  Asperbuf Duoprin Mono-gesic Trendar  Aspergum Duradyne Motrin or Motrin IB Triminicin  Aspirin  plain, buffered or enteric coated Durasal Myochrisine Trigesic  Aspirin Suppositories Easprin Nalfon Trillsate  Aspirin with Codeine Ecotrin Regular or Extra Strength Naprosyn Uracel  Atromid-S Efficin Naproxen Ursinus  Auranofin Capsules Elmiron Neocylate Vanquish  Axotal Emagrin Norgesic Verin  Azathioprine Empirin or Empirin with Codeine Normiflo Vitamin E  Azolid Emprazil Nuprin Voltaren  Bayer Aspirin plain, buffered or children's or timed BC Tablets or powders Encaprin Orgaran Warfarin Sodium  Buff-a-Comp Enoxaparin Orudis Zorpin  Buff-a-Comp with Codeine Equegesic Os-Cal-Gesic   Buffaprin Excedrin plain, buffered or Extra Strength Oxalid   Bufferin Arthritis Strength Feldene Oxphenbutazone   Bufferin plain or Extra Strength Feldene Capsules Oxycodone with Aspirin   Bufferin with Codeine Fenoprofen Fenoprofen Pabalate or Pabalate-SF   Buffets II Flogesic Panagesic   Buffinol plain or Extra Strength Florinal or Florinal with Codeine Panwarfarin   Buf-Tabs Flurbiprofen Penicillamine   Butalbital Compound Four-way cold tablets Penicillin   Butazolidin Fragmin Pepto-Bismol   Carbenicillin Geminisyn Percodan   Carna Arthritis Reliever Geopen Persantine   Carprofen Gold's salt Persistin   Chloramphenicol Goody's Phenylbutazone   Chloromycetin Haltrain Piroxlcam   Clmetidine heparin Plaquenil   Cllnoril Hyco-pap Ponstel   Clofibrate Hydroxy chloroquine Propoxyphen         Before stopping any of these medications, be sure to consult the physician who ordered them.  Some, such as Coumadin (Warfarin) are ordered to prevent or treat serious conditions such as "deep thrombosis", "pumonary embolisms", and other heart problems.  The amount of time that you may need off of the medication may also vary with the medication and the reason for which you were taking it.  If you are taking any of these medications, please make sure you notify your pain physician before you undergo any  procedures.         Facet Joint Block The facet joints connect the bones of the spine (vertebrae). They make it possible for you to bend, twist, and make other movements with your spine. They also prevent you from overbending, overtwisting, and making other excessive movements.  A facet joint block is a procedure where a numbing medicine (anesthetic) is injected into a facet joint. Often, a type of anti-inflammatory medicine called a steroid is also injected. A facet joint block may be done for two reasons:  2. Diagnosis. A facet joint block may be done as a test to see whether neck or back pain is caused by a worn-down or infected facet joint. If the pain gets better after a facet joint block, it means the   pain is probably coming from the facet joint. If the pain does not get better, it means the pain is probably not coming from the facet joint.  3. Therapy. A facet joint block may be done to relieve neck or back pain caused by a facet joint. A facet joint block is only done as a therapy if the pain does not improve with medicine, exercise programs, physical therapy, and other forms of pain management. LET YOUR HEALTH CARE PROVIDER KNOW ABOUT:   Any allergies you have.   All medicines you are taking, including vitamins, herbs, eyedrops, and over-the-counter medicines and creams.   Previous problems you or members of your family have had with the use of anesthetics.   Any blood disorders you have had.   Other health problems you have. RISKS AND COMPLICATIONS Generally, having a facet joint block is safe. However, as with any procedure, complications can occur. Possible complications associated with having a facet joint block include:   Bleeding.   Injury to a nerve near the injection site.   Pain at the injection site.   Weakness or numbness in areas controlled by nerves near the injection site.   Infection.   Temporary fluid retention.   Allergic reaction to  anesthetics or medicines used during the procedure. BEFORE THE PROCEDURE   Follow your health care provider's instructions if you are taking dietary supplements or medicines. You may need to stop taking them or reduce your dosage.   Do not take any new dietary supplements or medicines without asking your health care provider first.   Follow your health care provider's instructions about eating and drinking before the procedure. You may need to stop eating and drinking several hours before the procedure.   Arrange to have an adult drive you home after the procedure. PROCEDURE 12. You may need to remove your clothing and dress in an open-back gown so that your health care provider can access your spine.  13. The procedure will be done while you are lying on an X-ray table. Most of the time you will be asked to lie on your stomach, but you may be asked to lie in a different position if an injection will be made in your neck.  14. Special machines will be used to monitor your oxygen levels, heart rate, and blood pressure.  15. If an injection will be made in your neck, an intravenous (IV) tube will be inserted into one of your veins. Fluids and medicine will flow directly into your body through the IV tube.  16. The area over the facet joint where the injection will be made will be cleaned with an antiseptic soap. The surrounding skin will be covered with sterile drapes.  17. An anesthetic will be applied to your skin to make the injection area numb. You may feel a temporary stinging or burning sensation.  18. A video X-ray machine will be used to locate the joint. A contrast dye may be injected into the facet joint area to help with locating the joint.  19. When the joint is located, an anesthetic medicine will be injected into the joint through the needle.  20. Your health care provider will ask you whether you feel pain relief. If you do feel relief, a steroid may be injected to provide  pain relief for a longer period of time. If you do not feel relief or feel only partial relief, additional injections of an anesthetic may be made in other facet joints.  21.   The needle will be removed, the skin will be cleansed, and bandages will be applied.  AFTER THE PROCEDURE   You will be observed for 15-30 minutes before being allowed to go home. Do not drive. Have an adult drive you or take a taxi or public transportation instead.   If you feel pain relief, the pain will return in several hours or days when the anesthetic wears off.   You may feel pain relief 2-14 days after the procedure. The amount of time this relief lasts varies from person to person.   It is normal to feel some tenderness over the injected area(s) for 2 days following the procedure.   If you have diabetes, you may have a temporary increase in blood sugar.   This information is not intended to replace advice given to you by your health care provider. Make sure you discuss any questions you have with your health care provider.   Document Released: 10/21/2006 Document Revised: 06/22/2014 Document Reviewed: 03/21/2012 [employer] Interactive Patient Education 2016 [employer]  

## 2015-03-25 ENCOUNTER — Other Ambulatory Visit: Payer: Self-pay | Admitting: Family Medicine

## 2015-03-25 NOTE — Progress Notes (Signed)
Patient's Name: Alice Simmons MRN: [id number] DOB: 20-Apr-1956 DOS: 03/21/2015  Primary Reason(s) for Visit: Encounter for Medication Management. CC: Back Pain   HPI:   [name] is a 59 y.o. year old, female patient, who returns today as an established patient. She has Hypertension goal BP (blood pressure) < 140/90; Anxiety; Hypothyroidism; Apnea, sleep; Obesity; Depression; Arthritis; Tobacco abuse; Screening for breast cancer; Migraine headache; Chronic insomnia; Breast pain in female; Bipolar disorder, current episode depressed, severe, with psychotic features ([place]); Self-inflicted injury; Clinical depression; Diabetes mellitus type 2, controlled, without complications ([place]); Mass of left breast on mammogram; Bilateral tinnitus; Back pain, thoracic; Hyperlipidemia LDL goal <100; Vitamin D deficiency; Primary meningioma of optic nerve sheath ([place]); Bronchospasm; Marijuana use; Continuous illicit drug use; Chronic low back pain; Chronic pain syndrome; Facet syndrome, lumbar; Low back pain; History of migraine headaches; Lumbar radicular pain; Lumbosacral radiculopathy; Right leg pain; and Osteoarthritis of spine with radiculopathy, lumbar region on her problem list.. Her primarily concern today is the Back Pain      Post-Procedure Assessment: Side-effects or Adverse reactions: No significant issues reported. Sedation: Procedure was performed with sedation. Ultra-Short Term Relief (Initial 30-60 minutes after procedure):  100% Short-Term Relief (Initial 6 hours after procedure):  100% Long-Term Relief (Initial 2 weeks after procedure):  90% Current Relief (Now):  90% Interpretation of Results: Ultra-short term relief is a normal physiological response to analgesics and anesthetics provided during the procedure. Short-term relief confirms injected site as etiology of pain. Long term relief is possibly due to sympathetic blockade, or the effects of steroids, if administered during procedure.  Persistent relief would suggest effective anti-inflammatory effects from steroids.  Allergies: [name] is allergic to aspirin and penicillins.  Meds: The patient has a current medication list which includes the following prescription(s): amlodipine, labetalol, losartan, paroxetine, topiramate, zolpidem, azithromycin, fentanyl, fluticasone, ibuprofen, levothyroxine, omeprazole, prednisone, tizanidine, and virtussin a/c. Requested Prescriptions    No prescriptions requested or ordered in this encounter    ROS: Constitutional: Afebrile, no chills, well hydrated and well nourished Gastrointestinal: negative Musculoskeletal:negative Neurological: negative Behavioral/Psych: negative  PFSH: Medical:  [name]  has a past medical history of Hypertension; Anxiety; Obesity (BMI 30.0-34.9); Hypertension, benign; Depression, controlled; and Pain of both breasts. Family: family history includes Breast cancer in her paternal aunt; Diabetes in her father and mother; Heart disease in her brother and mother; Hyperlipidemia in her brother, brother, brother, brother, and sister; Hypertension in her father and mother. Surgical:  has past surgical history that includes Abdominal hysterectomy; Carpal tunnel release; Breast surgery; Breast biopsy (Bilateral, 1980); Breast biopsy (Right, 1985); and Eye surgery. Tobacco:  reports that she has been smoking Cigarettes.  She has a 18 pack-year smoking history. She does not have any smokeless tobacco history on file. Alcohol:  reports that she drinks about 0.6 oz of alcohol per week. Drug:  reports that she uses illicit drugs.  Physical Exam: Vitals:  Today's Vitals   03/21/15 1353 03/21/15 1401  BP: 114/56   Pulse: 55   Temp: 98 F (36.7 C)   TempSrc: Oral   Resp: 16   Height: 5\' 2"  (1.575 m)   Weight: 140 lb (63.504 kg)   SpO2: 99%   PainSc:  10-Worst pain ever  Calculated BMI: Body mass index is 25.6 kg/(m^2). General appearance: alert,  cooperative, appears stated age and mild distress Eyes: conjunctivae/corneas clear. PERRL, EOM's intact. Fundi benign. Lungs: No evidence respiratory distress, no audible rales or ronchi and no use of accessory muscles  of respiration Neck: no adenopathy, no carotid bruit, no JVD, supple, symmetrical, trachea midline and thyroid not enlarged, symmetric, no tenderness/mass/nodules Back: symmetric, no curvature. ROM normal. No CVA tenderness. Extremities: extremities normal, atraumatic, no cyanosis or edema Pulses: 2+ and symmetric Skin: Skin color, texture, turgor normal. No rashes or lesions Neurologic: Grossly normal    Assessment: Encounter Diagnosis:  Primary Diagnosis: Facet syndrome, lumbar [M54.5]  Plan: Katarzyna was seen today for back pain.  Diagnoses and all orders for this visit:  Facet syndrome, lumbar Comments: Bilateral. Confirmed through diagnostic lumbar facet block. Orders: -     LUMBAR FACET(MEDIAL BRANCH NERVE BLOCK) MBNB; Future  Marijuana use  Continuous illicit drug use  Osteoarthritis of spine with radiculopathy, lumbar region Comments: Right leg pain  Chronic low back pain  Chronic pain syndrome  Low back pain without sciatica, unspecified back pain laterality  History of migraine headaches  Lumbar radicular pain  Lumbosacral radiculopathy  Right leg pain     Patient Instructions   GENERAL RISKS AND COMPLICATIONS  What are the risk, side effects and possible complications? Generally speaking, most procedures are safe.  However, with any procedure there are risks, side effects, and the possibility of complications.  The risks and complications are dependent upon the sites that are lesioned, or the type of nerve block to be performed.  The closer the procedure is to the spine, the more serious the risks are.  Great care is taken when placing the radio frequency needles, block needles or lesioning probes, but sometimes complications can  occur. 1. Infection: Any time there is an injection through the skin, there is a risk of infection.  This is why sterile conditions are used for these blocks.  There are four possible types of infection. 1. Localized skin infection. 2. Central Nervous System Infection-This can be in the form of Meningitis, which can be deadly. 3. Epidural Infections-This can be in the form of an epidural abscess, which can cause pressure inside of the spine, causing compression of the spinal cord with subsequent paralysis. This would require an emergency surgery to decompress, and there are no guarantees that the patient would recover from the paralysis. 4. Discitis-This is an infection of the intervertebral discs.  It occurs in about 1% of discography procedures.  It is difficult to treat and it may lead to surgery.        2. Pain: the needles have to go through skin and soft tissues, will cause soreness.       3. Damage to internal structures:  The nerves to be lesioned may be near blood vessels or    other nerves which can be potentially damaged.       4. Bleeding: Bleeding is more common if the patient is taking blood thinners such as  aspirin, Coumadin, Ticiid, Plavix, etc., or if he/she have some genetic predisposition  such as hemophilia. Bleeding into the spinal canal can cause compression of the spinal  cord with subsequent paralysis.  This would require an emergency surgery to  decompress and there are no guarantees that the patient would recover from the  paralysis.       5. Pneumothorax:  Puncturing of a lung is a possibility, every time a needle is introduced in  the area of the chest or upper back.  Pneumothorax refers to free air around the  collapsed lung(s), inside of the thoracic cavity (chest cavity).  Another two possible  complications related to a similar event would include: Hemothorax  and Chylothorax.   These are variations of the Pneumothorax, where instead of air around the collapsed  lung(s),  you may have blood or chyle, respectively.       6. Spinal headaches: They may occur with any procedures in the area of the spine.       7. Persistent CSF (Cerebro-Spinal Fluid) leakage: This is a rare problem, but may occur  with prolonged intrathecal or epidural catheters either due to the formation of a fistulous  track or a dural tear.       8. Nerve damage: By working so close to the spinal cord, there is always a possibility of  nerve damage, which could be as serious as a permanent spinal cord injury with  paralysis.       9. Death:  Although rare, severe deadly allergic reactions known as "Anaphylactic  reaction" can occur to any of the medications used.      10. Worsening of the symptoms:  We can always make thing worse.  What are the chances of something like this happening? Chances of any of this occuring are extremely low.  By statistics, you have more of a chance of getting killed in a motor vehicle accident: while driving to the hospital than any of the above occurring .  Nevertheless, you should be aware that they are possibilities.  In general, it is similar to taking a shower.  Everybody knows that you can slip, hit your head and get killed.  Does that mean that you should not shower again?  Nevertheless always keep in mind that statistics do not mean anything if you happen to be on the wrong side of them.  Even if a procedure has a 1 (one) in a 1,000,000 (million) chance of going wrong, it you happen to be that one..Also, keep in mind that by statistics, you have more of a chance of having something go wrong when taking medications.  Who should not have this procedure? If you are on a blood thinning medication (e.g. Coumadin, Plavix, see list of "Blood Thinners"), or if you have an active infection going on, you should not have the procedure.  If you are taking any blood thinners, please inform your physician.  How should I prepare for this procedure?  Do not eat or drink anything at  least six hours prior to the procedure.  Bring a driver with you .  It cannot be a taxi.  Come accompanied by an adult that can drive you back, and that is strong enough to help you if your legs get weak or numb from the local anesthetic.  Take all of your medicines the morning of the procedure with just enough water to swallow them.  If you have diabetes, make sure that you are scheduled to have your procedure done first thing in the morning, whenever possible.  If you have diabetes, take only half of your insulin dose and notify our nurse that you have done so as soon as you arrive at the clinic.  If you are diabetic, but only take blood sugar pills (oral hypoglycemic), then do not take them on the morning of your procedure.  You may take them after you have had the procedure.  Do not take aspirin or any aspirin-containing medications, at least eleven (11) days prior to the procedure.  They may prolong bleeding.  Wear loose fitting clothing that may be easy to take off and that you would not mind if it got stained with  Betadine or blood.  Do not wear any jewelry or perfume  Remove any nail coloring.  It will interfere with some of our monitoring equipment.  NOTE: Remember that this is not meant to be interpreted as a complete list of all possible complications.  Unforeseen problems may occur.  BLOOD THINNERS The following drugs contain aspirin or other products, which can cause increased bleeding during surgery and should not be taken for 2 weeks prior to and 1 week after surgery.  If you should need take something for relief of minor pain, you may take acetaminophen which is found in Tylenol,m Datril, Anacin-3 and Panadol. It is not blood thinner. The products listed below are.  Do not take any of the products listed below in addition to any listed on your instruction sheet.  A.P.C or A.P.C with Codeine Codeine Phosphate Capsules #3 Ibuprofen Ridaura  ABC compound Congesprin Imuran  rimadil  Advil Cope Indocin Robaxisal  Alka-Seltzer Effervescent Pain Reliever and Antacid Coricidin or Coricidin-D  Indomethacin Rufen  Alka-Seltzer plus Cold Medicine Cosprin Ketoprofen S-A-C Tablets  Anacin Analgesic Tablets or Capsules Coumadin Korlgesic Salflex  Anacin Extra Strength Analgesic tablets or capsules CP-2 Tablets Lanoril Salicylate  Anaprox Cuprimine Capsules Levenox Salocol  Anexsia-D Dalteparin Magan Salsalate  Anodynos Darvon compound Magnesium Salicylate Sine-off  Ansaid Dasin Capsules Magsal Sodium Salicylate  Anturane Depen Capsules Marnal Soma  APF Arthritis pain formula Dewitt's Pills Measurin Stanback  Argesic Dia-Gesic Meclofenamic Sulfinpyrazone  Arthritis Bayer Timed Release Aspirin Diclofenac Meclomen Sulindac  Arthritis pain formula Anacin Dicumarol Medipren Supac  Analgesic (Safety coated) Arthralgen Diffunasal Mefanamic Suprofen  Arthritis Strength Bufferin Dihydrocodeine Mepro Compound Suprol  Arthropan liquid Dopirydamole Methcarbomol with Aspirin Synalgos  ASA tablets/Enseals Disalcid Micrainin Tagament  Ascriptin Doan's Midol Talwin  Ascriptin A/D Dolene Mobidin Tanderil  Ascriptin Extra Strength Dolobid Moblgesic Ticlid  Ascriptin with Codeine Doloprin or Doloprin with Codeine Momentum Tolectin  Asperbuf Duoprin Mono-gesic Trendar  Aspergum Duradyne Motrin or Motrin IB Triminicin  Aspirin plain, buffered or enteric coated Durasal Myochrisine Trigesic  Aspirin Suppositories Easprin Nalfon Trillsate  Aspirin with Codeine Ecotrin Regular or Extra Strength Naprosyn Uracel  Atromid-S Efficin Naproxen Ursinus  Auranofin Capsules Elmiron Neocylate Vanquish  Axotal Emagrin Norgesic Verin  Azathioprine Empirin or Empirin with Codeine Normiflo Vitamin E  Azolid Emprazil Nuprin Voltaren  Bayer Aspirin plain, buffered or children's or timed BC Tablets or powders Encaprin Orgaran Warfarin Sodium  Buff-a-Comp Enoxaparin Orudis Zorpin  Buff-a-Comp with  Codeine Equegesic Os-Cal-Gesic   Buffaprin Excedrin plain, buffered or Extra Strength Oxalid   Bufferin Arthritis Strength Feldene Oxphenbutazone   Bufferin plain or Extra Strength Feldene Capsules Oxycodone with Aspirin   Bufferin with Codeine Fenoprofen Fenoprofen Pabalate or Pabalate-SF   Buffets II Flogesic Panagesic   Buffinol plain or Extra Strength Florinal or Florinal with Codeine Panwarfarin   Buf-Tabs Flurbiprofen Penicillamine   Butalbital Compound Four-way cold tablets Penicillin   Butazolidin Fragmin Pepto-Bismol   Carbenicillin Geminisyn Percodan   Carna Arthritis Reliever Geopen Persantine   Carprofen Gold's salt Persistin   Chloramphenicol Goody's Phenylbutazone   Chloromycetin Haltrain Piroxlcam   Clmetidine heparin Plaquenil   Cllnoril Hyco-pap Ponstel   Clofibrate Hydroxy chloroquine Propoxyphen         Before stopping any of these medications, be sure to consult the physician who ordered them.  Some, such as Coumadin (Warfarin) are ordered to prevent or treat serious conditions such as "deep thrombosis", "pumonary embolisms", and other heart problems.  The amount of time that you may  need off of the medication may also vary with the medication and the reason for which you were taking it.  If you are taking any of these medications, please make sure you notify your pain physician before you undergo any procedures.         Facet Joint Block The facet joints connect the bones of the spine (vertebrae). They make it possible for you to bend, twist, and make other movements with your spine. They also prevent you from overbending, overtwisting, and making other excessive movements.  A facet joint block is a procedure where a numbing medicine (anesthetic) is injected into a facet joint. Often, a type of anti-inflammatory medicine called a steroid is also injected. A facet joint block may be done for two reasons:  2. Diagnosis. A facet joint block may be done as a test to  see whether neck or back pain is caused by a worn-down or infected facet joint. If the pain gets better after a facet joint block, it means the pain is probably coming from the facet joint. If the pain does not get better, it means the pain is probably not coming from the facet joint.  3. Therapy. A facet joint block may be done to relieve neck or back pain caused by a facet joint. A facet joint block is only done as a therapy if the pain does not improve with medicine, exercise programs, physical therapy, and other forms of pain management. LET [facility] CARE PROVIDER KNOW ABOUT:   Any allergies you have.   All medicines you are taking, including vitamins, herbs, eyedrops, and over-the-counter medicines and creams.   Previous problems you or members of your family have had with the use of anesthetics.   Any blood disorders you have had.   Other health problems you have. RISKS AND COMPLICATIONS Generally, having a facet joint block is safe. However, as with any procedure, complications can occur. Possible complications associated with having a facet joint block include:   Bleeding.   Injury to a nerve near the injection site.   Pain at the injection site.   Weakness or numbness in areas controlled by nerves near the injection site.   Infection.   Temporary fluid retention.   Allergic reaction to anesthetics or medicines used during the procedure. BEFORE THE PROCEDURE   Follow your health care provider's instructions if you are taking dietary supplements or medicines. You may need to stop taking them or reduce your dosage.   Do not take any new dietary supplements or medicines without asking your health care provider first.   Follow your health care provider's instructions about eating and drinking before the procedure. You may need to stop eating and drinking several hours before the procedure.   Arrange to have an adult drive you home after the  procedure. PROCEDURE 12. You may need to remove your clothing and dress in an open-back gown so that your health care provider can access your spine.  13. The procedure will be done while you are lying on an X-ray table. Most of the time you will be asked to lie on your stomach, but you may be asked to lie in a different position if an injection will be made in your neck.  14. Special machines will be used to monitor your oxygen levels, heart rate, and blood pressure.  15. If an injection will be made in your neck, an intravenous (IV) tube will be inserted into one of your veins. Fluids and  medicine will flow directly into your body through the IV tube.  16. The area over the facet joint where the injection will be made will be cleaned with an antiseptic soap. The surrounding skin will be covered with sterile drapes.  17. An anesthetic will be applied to your skin to make the injection area numb. You may feel a temporary stinging or burning sensation.  18. A video X-ray machine will be used to locate the joint. A contrast dye may be injected into the facet joint area to help with locating the joint.  19. When the joint is located, an anesthetic medicine will be injected into the joint through the needle.  62. Your health care provider will ask you whether you feel pain relief. If you do feel relief, a steroid may be injected to provide pain relief for a longer period of time. If you do not feel relief or feel only partial relief, additional injections of an anesthetic may be made in other facet joints.  21. The needle will be removed, the skin will be cleansed, and bandages will be applied.  AFTER THE PROCEDURE   You will be observed for 15-30 minutes before being allowed to go home. Do not drive. Have an adult drive you or take a taxi or public transportation instead.   If you feel pain relief, the pain will return in several hours or days when the anesthetic wears off.   You may feel  pain relief 2-14 days after the procedure. The amount of time this relief lasts varies from person to person.   It is normal to feel some tenderness over the injected area(s) for 2 days following the procedure.   If you have diabetes, you may have a temporary increase in blood sugar.   This information is not intended to replace advice given to you by your health care provider. Make sure you discuss any questions you have with your health care provider.   Document Released: 10/21/2006 Document Revised: 06/22/2014 Document Reviewed: 03/21/2012 Elsevier Interactive Patient Education 2016 [employer].   Medications discontinued today:  There are no discontinued medications. Medications administered today:  [name] does not currently have medications on file.  Primary Care Physician: Bobetta Lime, MD Location: [facility] Note by: Kathlen Brunswick. Dossie Arbour, M.D, DABA, DABAPM, DABPM, DABIPP, FIPP

## 2015-03-28 ENCOUNTER — Ambulatory Visit: Payer: Medicare Other | Attending: Pain Medicine | Admitting: Pain Medicine

## 2015-03-28 ENCOUNTER — Encounter: Payer: Self-pay | Admitting: Pain Medicine

## 2015-03-28 VITALS — BP 99/44 | HR 50 | Temp 98.9°F | Resp 18 | Ht 62.0 in | Wt 140.0 lb

## 2015-03-28 DIAGNOSIS — M549 Dorsalgia, unspecified: Secondary | ICD-10-CM | POA: Diagnosis present

## 2015-03-28 DIAGNOSIS — M47816 Spondylosis without myelopathy or radiculopathy, lumbar region: Secondary | ICD-10-CM | POA: Diagnosis not present

## 2015-03-28 DIAGNOSIS — M545 Low back pain, unspecified: Secondary | ICD-10-CM

## 2015-03-28 DIAGNOSIS — M4726 Other spondylosis with radiculopathy, lumbar region: Secondary | ICD-10-CM

## 2015-03-28 DIAGNOSIS — G8929 Other chronic pain: Secondary | ICD-10-CM

## 2015-03-28 MED ORDER — ROPIVACAINE HCL 2 MG/ML IJ SOLN
INTRAMUSCULAR | Status: AC
  Filled 2015-03-28: qty 10

## 2015-03-28 MED ORDER — TRIAMCINOLONE ACETONIDE 40 MG/ML IJ SUSP
INTRAMUSCULAR | Status: AC
  Filled 2015-03-28: qty 1

## 2015-03-28 MED ORDER — FENTANYL CITRATE (PF) 100 MCG/2ML IJ SOLN
100.0000 ug | Freq: Once | INTRAMUSCULAR | Status: AC
  Administered 2015-03-28: 50 ug via INTRAVENOUS

## 2015-03-28 MED ORDER — TRIAMCINOLONE ACETONIDE 40 MG/ML IJ SUSP
40.0000 mg | Freq: Once | INTRAMUSCULAR | Status: AC
  Administered 2015-03-28 (×2)

## 2015-03-28 MED ORDER — FENTANYL CITRATE (PF) 100 MCG/2ML IJ SOLN
INTRAMUSCULAR | Status: AC
  Administered 2015-03-28: 50 ug via INTRAVENOUS
  Filled 2015-03-28: qty 2

## 2015-03-28 MED ORDER — ROPIVACAINE HCL 2 MG/ML IJ SOLN
9.0000 mL | Freq: Once | INTRAMUSCULAR | Status: AC
  Administered 2015-03-28 (×2)

## 2015-03-28 MED ORDER — MIDAZOLAM HCL 5 MG/5ML IJ SOLN
INTRAMUSCULAR | Status: AC
  Administered 2015-03-28: 4 mg via INTRAVENOUS
  Filled 2015-03-28: qty 5

## 2015-03-28 MED ORDER — MIDAZOLAM HCL 5 MG/5ML IJ SOLN
5.0000 mg | Freq: Once | INTRAMUSCULAR | Status: AC
  Administered 2015-03-28: 4 mg via INTRAVENOUS

## 2015-03-28 NOTE — Progress Notes (Signed)
Patient's Name: Alice Simmons MRN: [id number] DOB: 08-Oct-1955 DOS: 03/28/2015  Primary Reason(s) for Visit: Interventional Pain Management Treatment. CC: Back Pain   Pre-Procedure Assessment: Alice Simmons is a 59 y.o. year old, female patient, seen today for interventional treatment. She has Hypertension goal BP (blood pressure) < 140/90; Anxiety; Hypothyroidism; Apnea, sleep; Obesity; Depression; Arthritis; Tobacco abuse; Screening for breast cancer; Migraine headache; Chronic insomnia; Breast pain in female; Bipolar disorder, current episode depressed, severe, with psychotic features ([place]); Self-inflicted injury; Clinical depression; Diabetes mellitus type 2, controlled, without complications ([place]); Mass of left breast on mammogram; Bilateral tinnitus; Back pain, thoracic; Hyperlipidemia LDL goal <100; Vitamin D deficiency; Primary meningioma of optic nerve sheath ([place]); Bronchospasm; Marijuana use; Continuous illicit drug use; Chronic low back pain; Chronic pain syndrome; Lumbar facet syndrome; Low back pain; History of migraine headaches; Lumbar radicular pain; Lumbosacral radiculopathy; Right leg pain; and Lumbar spondylosis on her problem list.. Her primarily concern today is the Back Pain Verification of the correct person, correct site (including marking of site), and correct procedure were performed and confirmed by the patient. Today's Vitals   03/28/15 0948 03/28/15 0957 03/28/15 1007 03/28/15 1018  BP: 136/60 103/43 100/43 99/44  Pulse: 58 56 51 50  Temp:      Resp: 16 16 16 18   Height:      Weight:      SpO2: 95% 96% 99% 99%  PainSc: 0-No pain Asleep Asleep 0-No pain  PainLoc:      Calculated BMI: Body mass index is 25.6 kg/(m^2). Allergies: She is allergic to aspirin and penicillins.. Primary Diagnosis: Osteoarthritis of spine with radiculopathy, lumbar region [M47.26]  Procedure: Type: Diagnostic Medial Branch Facet Block Region: Lumbar Level: L2, L3, L4, L5, & S1 Medial  Branch Level(s) Laterality: Bilateral  Indications: Spondylosis, Lumbosacral Region Lumbosacral Spine Pain  Consent: Secured. Under the influence of no sedatives a written informed consent was obtained, after having provided information on the risks and possible complications. To fulfill our ethical and legal obligations, as recommended by the American Medical Association's Code of Ethics, we have provided information to the patient about our clinical impression; the nature and purpose of the treatment or procedure; the risks, benefits, and possible complications of the intervention; alternatives; the risk(s) and benefit(s) of the alternative treatment(s) or procedure(s); and the risk(s) and benefit(s) of doing nothing. The patient was provided information about the risks and possible complications associated with the procedure. In the case of spinal procedures these may include, but are not limited to, failure to achieve desired goals, infection, bleeding, organ or nerve damage, allergic reactions, paralysis, and death. In addition, the patient was informed that Medicine is not an exact science; therefore, there is also the possibility of unforeseen risks and possible complications that may result in a catastrophic outcome. The patient indicated having understood very clearly. We have given the patient no guarantees and we have made no promises. Enough time was given to the patient to ask questions, all of which were answered to the patient's satisfaction.  Pre-Procedure Preparation: Safety Precautions: Allergies reviewed. Appropriate site, procedure, and patient were confirmed by following the Joint Commission's Universal Protocol (UP.01.01.01), in the form of a "Time Out". The patient was asked to confirm marked site and procedure, before commencing. The patient was asked about blood thinners, or active infections, both of which were denied. Patient was assessed for positional comfort and all pressure  points were checked before starting procedure. Monitoring:  As per clinic protocol. Infection Control Precautions: Sterile  technique used. Standard Universal Precautions were taken as recommended by the Department of [facility] for Disease Control and Prevention (CDC). Standard pre-surgical skin prep was conducted. Respiratory hygiene and cough etiquette was practiced. Hand hygiene observed. Safe injection practices and needle disposal techniques followed. SDV (single dose vial) medications used. Medications properly checked for expiration dates and contaminants. Personal protective equipment (PPE) used: Sterile double glove technique. Radiation resistant gloves. Sterile surgical gloves.  Anesthesia, Analgesia, Anxiolysis: Type: Moderate (Conscious) Sedation & Local Anesthesia. Meaningful verbal contact was maintained, with the patient at all times during the procedure. Local Anesthetic(s): Lidocaine 1% IV Access: Secured Sedation: Meaningful verbal contact was maintained at all times during the procedure. Indication(s):Anxiolysis and Analgesia.  Description of Procedure Process:  Time-out: "Time-out" completed before starting procedure, as per protocol. Position: Prone Target Area: For Lumbar Facet blocks, the target is the groove formed by the junction of the transverse process and superior articular process. For the L5 dorsal ramus, the target is the notch between superior articular process and sacral ala. For the S1 dorsal ramus, the target is the superior and lateral edge of the posterior S1 Sacral foramen. Approach: Paramedial approach. Area Prepped: Entire Posterior Lumbosacral Region Prepping solution: ChloraPrep (2% chlorhexidine gluconate and 70% isopropyl alcohol) Safety Precautions: Aspiration looking for blood return was conducted prior to all injections. At no point did we inject any substances, as a needle was being advanced. No attempts were made at seeking any  paresthesias. Safe injection practices and needle disposal techniques used. Medications properly checked for expiration dates. SDV (single dose vial) medications used. Description of the Procedure: Protocol guidelines were followed. The patient was placed in position over the fluoroscopy table. The target area was identified and the area prepped in the usual manner. Skin desensitized using vapocoolant spray. Skin & deeper tissues infiltrated with local anesthetic. Appropriate amount of time allowed to pass for local anesthetics to take effect. The procedure needle was introduced through the skin, ipsilateral to the reported pain, and advanced to the target area. Employing the "Medial Branch Technique", the needles were advanced to the angle made by the superior and medial portion of the transverse process, and the lateral and inferior portion of the superior articulating process of the targeted vertebral bodies. This area is known as "Burton's Eye" or the "Eye of the [place] Dog". A procedure needle was introduced through the skin, and this time advanced to the angle made by the superior and medial border of the sacral ala, and the lateral border of the S1 vertebral body. This last needle was later repositioned at the superior and lateral border of the posterior S1 foramen. Negative aspiration confirmed. Solution injected in intermittent fashion, asking for systemic symptoms every 0.5cc of injectate. The needles were then removed and the area cleansed, making sure to leave some of the prepping solution back to take advantage of its long term bactericidal properties. EBL: None Materials & Medications Used:  Needle(s) Used: 22g - 3.5" Spinal Needle(s) Medications Administered today: We administered midazolam, fentaNYL, ropivacaine (PF) 2 mg/ml (0.2%), triamcinolone acetonide, ropivacaine (PF) 2 mg/ml (0.2%), fentaNYL, triamcinolone acetonide, midazolam, ropivacaine (PF) 2 mg/ml (0.2%), and triamcinolone  acetonide.Please see chart orders for dosing details.  Imaging Guidance:  Type of Imaging Technique: Fluoroscopy Guidance (Spinal) Indication(s): Assistance in needle guidance and placement for procedures requiring needle placement in or near specific anatomical locations not easily accessible without such assistance. Exposure Time: Please see nurses notes. Contrast: None required. Fluoroscopic Guidance: I was personally present  in the fluoroscopy suite, where the patient was placed in position for the procedure, over the fluoroscopy-compatible table. Fluoroscopy was manipulated, using "Tunnel Vision Technique", to obtain the best possible view of the target area, on the affected side. Parallax error was corrected before commencing the procedure. A "direction-depth-direction" technique was used to introduce the needle under continuous pulsed fluoroscopic guidance. Once the target was reached, antero-posterior, oblique, and lateral fluoroscopic projection views were taken to confirm needle placement in all planes. Permanently recorded images stored by scanning into EMR. Interpretation: Intraoperative imaging interpretation by performing Physician. Adequate needle placement confirmed. Adequate needle placement confirmed in AP, lateral, & Oblique Views. No contrast injected.  Antibiotics:  Type:  Antibiotics Given (last 72 hours)    None      Indication(s): No indications identified.  Post-operative Assessment:  Complications: No immediate post-treatment complications were observed. Disposition: Return to clinic for follow-up evaluation. The patient tolerated the entire procedure well. A repeat set of vitals were taken after the procedure and the patient was kept under observation following institutional policy, for this procedure. Post-procedural neurological assessment was performed, showing return to baseline, prior to discharge. The patient was discharged home, once institutional criteria were  met. The patient was provided with post-procedure discharge instructions, including a section on how to identify potential problems. Should any problems arise concerning this procedure, the patient was given instructions to immediately contact us, at any time, without hesitation. In any case, we plan to contact the patient by telephone for a follow-up status report regarding this interventional procedure. Comments:  No additional relevant information.  Primary Care Physician: Bobetta Lime, MD Location: [facility] Note by: Kathlen Brunswick. Dossie Arbour, M.D, DABA, DABAPM, DABPM, DABIPP, FIPP  Disclaimer:  Medicine is not an exact science. The only guarantee in medicine is that nothing is guaranteed. It is important to note that the decision to proceed with this intervention was based on the information collected from the patient. The Data and conclusions were drawn from the patient's questionnaire, the interview, and the physical examination. Because the information was provided in large part by the patient, it cannot be guaranteed that it has not been purposely or unconsciously manipulated. Every effort has been made to obtain as much relevant data as possible for this evaluation. It is important to note that the conclusions that lead to this procedure are derived in large part from the available data. Always take into account that the treatment will also be dependent on availability of resources and existing treatment guidelines, considered by other Pain Management Practitioners as being common knowledge and practice, at the time of the intervention. For Medico-Legal purposes, it is also important to point out that variation in procedural techniques and pharmacological choices are the acceptable norm. The indications, contraindications, technique, and results of the above procedure should only be interpreted and judged by a Board-Certified Interventional Pain Specialist with  extensive familiarity and expertise in the same exact procedure and technique. Attempts at providing opinions without similar or greater experience and expertise than that of the treating physician will be considered as inappropriate and unethical, and shall result in a formal complaint to the state medical board and applicable specialty societies.

## 2015-03-28 NOTE — Patient Instructions (Addendum)
Smoking Cessation, Tips for Success If you are ready to quit smoking, congratulations! You have chosen to help yourself be healthier. Cigarettes bring nicotine, tar, carbon monoxide, and other irritants into your body. Your lungs, heart, and blood vessels will be able to work better without these poisons. There are many different ways to quit smoking. Nicotine gum, nicotine patches, a nicotine inhaler, or nicotine nasal spray can help with physical craving. Hypnosis, support groups, and medicines help break the habit of smoking. WHAT THINGS CAN I DO TO MAKE QUITTING EASIER?  Here are some tips to help you quit for good: 1. Pick a date when you will quit smoking completely. Tell all of your friends and family about your plan to quit on that date. 2. Do not try to slowly cut down on the number of cigarettes you are smoking. Pick a quit date and quit smoking completely starting on that day. 3. Throw away all cigarettes.  4. Clean and remove all ashtrays from your home, work, and car. 5. On a card, write down your reasons for quitting. Carry the card with you and read it when you get the urge to smoke. 6. Cleanse your body of nicotine. Drink enough water and fluids to keep your urine clear or pale yellow. Do this after quitting to flush the nicotine from your body. 7. Learn to predict your moods. Do not let a bad situation be your excuse to have a cigarette. Some situations in your life might tempt you into wanting a cigarette. 8. Never have "just one" cigarette. It leads to wanting another and another. Remind yourself of your decision to quit. 9. Change habits associated with smoking. If you smoked while driving or when feeling stressed, try other activities to replace smoking. Stand up when drinking your coffee. Brush your teeth after eating. Sit in a different chair when you read the paper. Avoid alcohol while trying to quit, and try to drink fewer caffeinated beverages. Alcohol and caffeine may urge you  to smoke. 10. Avoid foods and drinks that can trigger a desire to smoke, such as sugary or spicy foods and alcohol. 11. Ask people who smoke not to smoke around you. 12. Have something planned to do right after eating or having a cup of coffee. For example, plan to take a walk or exercise. 13. Try a relaxation exercise to calm you down and decrease your stress. Remember, you may be tense and nervous for the first 2 weeks after you quit, but this will pass. 14. Find new activities to keep your hands busy. Play with a pen, coin, or rubber band. Doodle or draw things on paper. 15. Brush your teeth right after eating. This will help cut down on the craving for the taste of tobacco after meals. You can also try mouthwash.  16. Use oral substitutes in place of cigarettes. Try using lemon drops, carrots, cinnamon sticks, or chewing gum. Keep them handy so they are available when you have the urge to smoke. 17. When you have the urge to smoke, try deep breathing. 18. Designate your home as a nonsmoking area. 19. If you are a heavy smoker, ask your health care provider about a prescription for nicotine chewing gum. It can ease your withdrawal from nicotine. 20. Reward yourself. Set aside the cigarette money you save and buy yourself something nice. 21. Look for support from others. Join a support group or smoking cessation program. Ask someone at home or at work to help you with your plan   to quit smoking. 22. Always ask yourself, "Do I need this cigarette or is this just a reflex?" Tell yourself, "Today, I choose not to smoke," or "I do not want to smoke." You are reminding yourself of your decision to quit. 23. Do not replace cigarette smoking with electronic cigarettes (commonly called e-cigarettes). The safety of e-cigarettes is unknown, and some may contain harmful chemicals. 24. If you relapse, do not give up! Plan ahead and think about what you will do the next time you get the urge to smoke. HOW WILL  I FEEL WHEN I QUIT SMOKING? You may have symptoms of withdrawal because your body is used to nicotine (the addictive substance in cigarettes). You may crave cigarettes, be irritable, feel very hungry, cough often, get headaches, or have difficulty concentrating. The withdrawal symptoms are only temporary. They are strongest when you first quit but will go away within 10-14 days. When withdrawal symptoms occur, stay in control. Think about your reasons for quitting. Remind yourself that these are signs that your body is healing and getting used to being without cigarettes. Remember that withdrawal symptoms are easier to treat than the major diseases that smoking can cause.  Even after the withdrawal is over, expect periodic urges to smoke. However, these cravings are generally short lived and will go away whether you smoke or not. Do not smoke! WHAT RESOURCES ARE AVAILABLE TO HELP ME QUIT SMOKING? Your health care provider can direct you to community resources or hospitals for support, which may include:  Group support.  Education.  Hypnosis.  Therapy.   This information is not intended to replace advice given to you by your health care provider. Make sure you discuss any questions you have with your health care provider.   Document Released: 02/28/2004 Document Revised: 06/22/2014 Document Reviewed: 11/17/2012 Elsevier Interactive Patient Education 2016 [employer] Pain Management Discharge Instructions  General Discharge Instructions :  If you need to reach your doctor call: Monday-Friday 8:00 am - 4:00 pm at [phone] or toll free [phone].  After clinic hours [phone] to have operator reach doctor.  Bring all of your medication bottles to all your appointments in the pain clinic.  To cancel or reschedule your appointment with Pain Management please remember to call 24 hours in advance to avoid a fee.  Refer to the educational materials which you have been given on:  General Risks, I had my Procedure. Discharge Instructions, Post Sedation.  Post Procedure Instructions:  The drugs you were given will stay in your system until tomorrow, so for the next 24 hours you should not drive, make any legal decisions or drink any alcoholic beverages.  You may eat anything you prefer, but it is better to start with liquids then soups and crackers, and gradually work up to solid foods.  Please notify your doctor immediately if you have any unusual bleeding, trouble breathing or pain that is not related to your normal pain.  Depending on the type of procedure that was done, some parts of your body may feel week and/or numb.  This usually clears up by tonight or the next day.  Walk with the use of an assistive device or accompanied by an adult for the 24 hours.  You may use ice on the affected area for the first 24 hours.  Put ice in a Ziploc bag and cover with a towel and place against area 15 minutes on 15 minutes off.  You may switch to heat after 24 hours.GENERAL RISKS AND  COMPLICATIONS  What are the risk, side effects and possible complications? Generally speaking, most procedures are safe.  However, with any procedure there are risks, side effects, and the possibility of complications.  The risks and complications are dependent upon the sites that are lesioned, or the type of nerve block to be performed.  The closer the procedure is to the spine, the more serious the risks are.  Great care is taken when placing the radio frequency needles, block needles or lesioning probes, but sometimes complications can occur. 1. Infection: Any time there is an injection through the skin, there is a risk of infection.  This is why sterile conditions are used for these blocks.  There are four possible types of infection. 1. Localized skin infection. 2. Central Nervous System Infection-This can be in the form of Meningitis, which can be deadly. 3. Epidural Infections-This can be in  the form of an epidural abscess, which can cause pressure inside of the spine, causing compression of the spinal cord with subsequent paralysis. This would require an emergency surgery to decompress, and there are no guarantees that the patient would recover from the paralysis. 4. Discitis-This is an infection of the intervertebral discs.  It occurs in about 1% of discography procedures.  It is difficult to treat and it may lead to surgery.        2. Pain: the needles have to go through skin and soft tissues, will cause soreness.       3. Damage to internal structures:  The nerves to be lesioned may be near blood vessels or    other nerves which can be potentially damaged.       4. Bleeding: Bleeding is more common if the patient is taking blood thinners such as  aspirin, Coumadin, Ticiid, Plavix, etc., or if he/she have some genetic predisposition  such as hemophilia. Bleeding into the spinal canal can cause compression of the spinal  cord with subsequent paralysis.  This would require an emergency surgery to  decompress and there are no guarantees that the patient would recover from the  paralysis.       5. Pneumothorax:  Puncturing of a lung is a possibility, every time a needle is introduced in  the area of the chest or upper back.  Pneumothorax refers to free air around the  collapsed lung(s), inside of the thoracic cavity (chest cavity).  Another two possible  complications related to a similar event would include: Hemothorax and Chylothorax.   These are variations of the Pneumothorax, where instead of air around the collapsed  lung(s), you may have blood or chyle, respectively.       6. Spinal headaches: They may occur with any procedures in the area of the spine.       7. Persistent CSF (Cerebro-Spinal Fluid) leakage: This is a rare problem, but may occur  with prolonged intrathecal or epidural catheters either due to the formation of a fistulous  track or a dural tear.       8. Nerve damage: By  working so close to the spinal cord, there is always a possibility of  nerve damage, which could be as serious as a permanent spinal cord injury with  paralysis.       9. Death:  Although rare, severe deadly allergic reactions known as "Anaphylactic  reaction" can occur to any of the medications used.      10. Worsening of the symptoms:  We can always make thing worse.  What   are the chances of something like this happening? Chances of any of this occuring are extremely low.  By statistics, you have more of a chance of getting killed in a motor vehicle accident: while driving to the hospital than any of the above occurring .  Nevertheless, you should be aware that they are possibilities.  In general, it is similar to taking a shower.  Everybody knows that you can slip, hit your head and get killed.  Does that mean that you should not shower again?  Nevertheless always keep in mind that statistics do not mean anything if you happen to be on the wrong side of them.  Even if a procedure has a 1 (one) in a 1,000,000 (million) chance of going wrong, it you happen to be that one..Also, keep in mind that by statistics, you have more of a chance of having something go wrong when taking medications.  Who should not have this procedure? If you are on a blood thinning medication (e.g. Coumadin, Plavix, see list of "Blood Thinners"), or if you have an active infection going on, you should not have the procedure.  If you are taking any blood thinners, please inform your physician.  How should I prepare for this procedure?  Do not eat or drink anything at least six hours prior to the procedure.  Bring a driver with you .  It cannot be a taxi.  Come accompanied by an adult that can drive you back, and that is strong enough to help you if your legs get weak or numb from the local anesthetic.  Take all of your medicines the morning of the procedure with just enough water to swallow them.  If you have diabetes,  make sure that you are scheduled to have your procedure done first thing in the morning, whenever possible.  If you have diabetes, take only half of your insulin dose and notify our nurse that you have done so as soon as you arrive at the clinic.  If you are diabetic, but only take blood sugar pills (oral hypoglycemic), then do not take them on the morning of your procedure.  You may take them after you have had the procedure.  Do not take aspirin or any aspirin-containing medications, at least eleven (11) days prior to the procedure.  They may prolong bleeding.  Wear loose fitting clothing that may be easy to take off and that you would not mind if it got stained with Betadine or blood.  Do not wear any jewelry or perfume  Remove any nail coloring.  It will interfere with some of our monitoring equipment.  NOTE: Remember that this is not meant to be interpreted as a complete list of all possible complications.  Unforeseen problems may occur.  BLOOD THINNERS The following drugs contain aspirin or other products, which can cause increased bleeding during surgery and should not be taken for 2 weeks prior to and 1 week after surgery.  If you should need take something for relief of minor pain, you may take acetaminophen which is found in Tylenol,m Datril, Anacin-3 and Panadol. It is not blood thinner. The products listed below are.  Do not take any of the products listed below in addition to any listed on your instruction sheet.  A.P.C or A.P.C with Codeine Codeine Phosphate Capsules #3 Ibuprofen Ridaura  ABC compound Congesprin Imuran rimadil  Advil Cope Indocin Robaxisal  Alka-Seltzer Effervescent Pain Reliever and Antacid Coricidin or Coricidin-D  Indomethacin Rufen  Alka-Seltzer plus   Cold Medicine Cosprin Ketoprofen S-A-C Tablets  Anacin Analgesic Tablets or Capsules Coumadin Korlgesic Salflex  Anacin Extra Strength Analgesic tablets or capsules CP-2 Tablets Lanoril Salicylate  Anaprox  Cuprimine Capsules Levenox Salocol  Anexsia-D Dalteparin Magan Salsalate  Anodynos Darvon compound Magnesium Salicylate Sine-off  Ansaid Dasin Capsules Magsal Sodium Salicylate  Anturane Depen Capsules Marnal Soma  APF Arthritis pain formula Dewitt's Pills Measurin Stanback  Argesic Dia-Gesic Meclofenamic Sulfinpyrazone  Arthritis Bayer Timed Release Aspirin Diclofenac Meclomen Sulindac  Arthritis pain formula Anacin Dicumarol Medipren Supac  Analgesic (Safety coated) Arthralgen Diffunasal Mefanamic Suprofen  Arthritis Strength Bufferin Dihydrocodeine Mepro Compound Suprol  Arthropan liquid Dopirydamole Methcarbomol with Aspirin Synalgos  ASA tablets/Enseals Disalcid Micrainin Tagament  Ascriptin Doan's Midol Talwin  Ascriptin A/D Dolene Mobidin Tanderil  Ascriptin Extra Strength Dolobid Moblgesic Ticlid  Ascriptin with Codeine Doloprin or Doloprin with Codeine Momentum Tolectin  Asperbuf Duoprin Mono-gesic Trendar  Aspergum Duradyne Motrin or Motrin IB Triminicin  Aspirin plain, buffered or enteric coated Durasal Myochrisine Trigesic  Aspirin Suppositories Easprin Nalfon Trillsate  Aspirin with Codeine Ecotrin Regular or Extra Strength Naprosyn Uracel  Atromid-S Efficin Naproxen Ursinus  Auranofin Capsules Elmiron Neocylate Vanquish  Axotal Emagrin Norgesic Verin  Azathioprine Empirin or Empirin with Codeine Normiflo Vitamin E  Azolid Emprazil Nuprin Voltaren  Bayer Aspirin plain, buffered or children's or timed BC Tablets or powders Encaprin Orgaran Warfarin Sodium  Buff-a-Comp Enoxaparin Orudis Zorpin  Buff-a-Comp with Codeine Equegesic Os-Cal-Gesic   Buffaprin Excedrin plain, buffered or Extra Strength Oxalid   Bufferin Arthritis Strength Feldene Oxphenbutazone   Bufferin plain or Extra Strength Feldene Capsules Oxycodone with Aspirin   Bufferin with Codeine Fenoprofen Fenoprofen Pabalate or Pabalate-SF   Buffets II Flogesic Panagesic   Buffinol plain or Extra Strength Florinal  or Florinal with Codeine Panwarfarin   Buf-Tabs Flurbiprofen Penicillamine   Butalbital Compound Four-way cold tablets Penicillin   Butazolidin Fragmin Pepto-Bismol   Carbenicillin Geminisyn Percodan   Carna Arthritis Reliever Geopen Persantine   Carprofen Gold's salt Persistin   Chloramphenicol Goody's Phenylbutazone   Chloromycetin Haltrain Piroxlcam   Clmetidine heparin Plaquenil   Cllnoril Hyco-pap Ponstel   Clofibrate Hydroxy chloroquine Propoxyphen         Before stopping any of these medications, be sure to consult the physician who ordered them.  Some, such as Coumadin (Warfarin) are ordered to prevent or treat serious conditions such as "deep thrombosis", "pumonary embolisms", and other heart problems.  The amount of time that you may need off of the medication may also vary with the medication and the reason for which you were taking it.  If you are taking any of these medications, please make sure you notify your pain physician before you undergo any procedures.         Facet Joint Block The facet joints connect the bones of the spine (vertebrae). They make it possible for you to bend, twist, and make other movements with your spine. They also prevent you from overbending, overtwisting, and making other excessive movements.  A facet joint block is a procedure where a numbing medicine (anesthetic) is injected into a facet joint. Often, a type of anti-inflammatory medicine called a steroid is also injected. A facet joint block may be done for two reasons:  25. Diagnosis. A facet joint block may be done as a test to see whether neck or back pain is caused by a worn-down or infected facet joint. If the pain gets better after a facet joint block, it means the pain is  probably coming from the facet joint. If the pain does not get better, it means the pain is probably not coming from the facet joint.  26. Therapy. A facet joint block may be done to relieve neck or back pain caused by  a facet joint. A facet joint block is only done as a therapy if the pain does not improve with medicine, exercise programs, physical therapy, and other forms of pain management. LET [facility] CARE PROVIDER KNOW ABOUT:   Any allergies you have.   All medicines you are taking, including vitamins, herbs, eyedrops, and over-the-counter medicines and creams.   Previous problems you or members of your family have had with the use of anesthetics.   Any blood disorders you have had.   Other health problems you have. RISKS AND COMPLICATIONS Generally, having a facet joint block is safe. However, as with any procedure, complications can occur. Possible complications associated with having a facet joint block include:   Bleeding.   Injury to a nerve near the injection site.   Pain at the injection site.   Weakness or numbness in areas controlled by nerves near the injection site.   Infection.   Temporary fluid retention.   Allergic reaction to anesthetics or medicines used during the procedure. BEFORE THE PROCEDURE   Follow your health care provider's instructions if you are taking dietary supplements or medicines. You may need to stop taking them or reduce your dosage.   Do not take any new dietary supplements or medicines without asking your health care provider first.   Follow your health care provider's instructions about eating and drinking before the procedure. You may need to stop eating and drinking several hours before the procedure.   Arrange to have an adult drive you home after the procedure. PROCEDURE 12. You may need to remove your clothing and dress in an open-back gown so that your health care provider can access your spine.  13. The procedure will be done while you are lying on an X-ray table. Most of the time you will be asked to lie on your stomach, but you may be asked to lie in a different position if an injection will be made in your neck.   14. Special machines will be used to monitor your oxygen levels, heart rate, and blood pressure.  15. If an injection will be made in your neck, an intravenous (IV) tube will be inserted into one of your veins. Fluids and medicine will flow directly into your body through the IV tube.  16. The area over the facet joint where the injection will be made will be cleaned with an antiseptic soap. The surrounding skin will be covered with sterile drapes.  17. An anesthetic will be applied to your skin to make the injection area numb. You may feel a temporary stinging or burning sensation.  18. A video X-ray machine will be used to locate the joint. A contrast dye may be injected into the facet joint area to help with locating the joint.  19. When the joint is located, an anesthetic medicine will be injected into the joint through the needle.  62. Your health care provider will ask you whether you feel pain relief. If you do feel relief, a steroid may be injected to provide pain relief for a longer period of time. If you do not feel relief or feel only partial relief, additional injections of an anesthetic may be made in other facet joints.  21. The needle  will be removed, the skin will be cleansed, and bandages will be applied.  AFTER THE PROCEDURE   You will be observed for 15-30 minutes before being allowed to go home. Do not drive. Have an adult drive you or take a taxi or public transportation instead.   If you feel pain relief, the pain will return in several hours or days when the anesthetic wears off.   You may feel pain relief 2-14 days after the procedure. The amount of time this relief lasts varies from person to person.   It is normal to feel some tenderness over the injected area(s) for 2 days following the procedure.   If you have diabetes, you may have a temporary increase in blood sugar.   This information is not intended to replace advice given to you by your health care  provider. Make sure you discuss any questions you have with your health care provider.   Document Released: 10/21/2006 Document Revised: 06/22/2014 Document Reviewed: 03/21/2012 Elsevier Interactive Patient Education [employer].

## 2015-03-28 NOTE — Progress Notes (Signed)
Safety precautions to be maintained throughout the outpatient stay will include: orient to surroundings, keep bed in low position, maintain call bell within reach at all times, provide assistance with transfer out of bed and ambulation.  

## 2015-03-29 ENCOUNTER — Other Ambulatory Visit: Payer: Self-pay | Admitting: Family Medicine

## 2015-03-29 ENCOUNTER — Telehealth: Payer: Self-pay | Admitting: *Deleted

## 2015-03-29 NOTE — Telephone Encounter (Signed)
Left voice mail

## 2015-04-02 DIAGNOSIS — R42 Dizziness and giddiness: Secondary | ICD-10-CM | POA: Diagnosis not present

## 2015-04-02 DIAGNOSIS — D329 Benign neoplasm of meninges, unspecified: Secondary | ICD-10-CM | POA: Diagnosis not present

## 2015-04-02 DIAGNOSIS — R51 Headache: Secondary | ICD-10-CM | POA: Diagnosis not present

## 2015-04-02 DIAGNOSIS — F1210 Cannabis abuse, uncomplicated: Secondary | ICD-10-CM | POA: Diagnosis not present

## 2015-04-11 ENCOUNTER — Ambulatory Visit: Payer: Medicare Other | Attending: Pain Medicine | Admitting: Pain Medicine

## 2015-04-11 ENCOUNTER — Encounter: Payer: Self-pay | Admitting: Pain Medicine

## 2015-04-11 VITALS — BP 120/62 | HR 80 | Temp 98.4°F | Resp 19 | Ht 62.0 in | Wt 135.0 lb

## 2015-04-11 DIAGNOSIS — F17200 Nicotine dependence, unspecified, uncomplicated: Secondary | ICD-10-CM

## 2015-04-11 DIAGNOSIS — Z9109 Other allergy status, other than to drugs and biological substances: Secondary | ICD-10-CM

## 2015-04-11 DIAGNOSIS — Z8659 Personal history of other mental and behavioral disorders: Secondary | ICD-10-CM | POA: Diagnosis not present

## 2015-04-11 DIAGNOSIS — M47816 Spondylosis without myelopathy or radiculopathy, lumbar region: Secondary | ICD-10-CM

## 2015-04-11 DIAGNOSIS — M797 Fibromyalgia: Secondary | ICD-10-CM

## 2015-04-11 DIAGNOSIS — Z789 Other specified health status: Secondary | ICD-10-CM

## 2015-04-11 DIAGNOSIS — M545 Low back pain, unspecified: Secondary | ICD-10-CM

## 2015-04-11 DIAGNOSIS — F17290 Nicotine dependence, other tobacco product, uncomplicated: Secondary | ICD-10-CM

## 2015-04-11 DIAGNOSIS — R202 Paresthesia of skin: Secondary | ICD-10-CM

## 2015-04-11 DIAGNOSIS — G8929 Other chronic pain: Secondary | ICD-10-CM

## 2015-04-11 DIAGNOSIS — R531 Weakness: Secondary | ICD-10-CM

## 2015-04-11 MED ORDER — ORPHENADRINE CITRATE 30 MG/ML IJ SOLN
60.0000 mg | Freq: Once | INTRAMUSCULAR | Status: AC
  Administered 2015-04-11: 60 mg via INTRAMUSCULAR

## 2015-04-11 NOTE — Progress Notes (Signed)
Safety precautions to be maintained throughout the outpatient stay will include: orient to surroundings, keep bed in low position, maintain call bell within reach at all times, provide assistance with transfer out of bed and ambulation.  
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
## 2015-04-11 NOTE — Patient Instructions (Signed)
Pain Management Discharge Instructions  General Discharge Instructions :  If you need to reach your doctor call: Monday-Friday 8:00 am - 4:00 pm at [phone] or toll free [phone].  After clinic hours [phone] to have operator reach doctor.  Bring all of your medication bottles to all your appointments in the pain clinic.  To cancel or reschedule your appointment with Pain Management please remember to call 24 hours in advance to avoid a fee.  Refer to the educational materials which you have been given on: General Risks, I had my Procedure. Discharge Instructions, Post Sedation.  Post Procedure Instructions:  The drugs you were given will stay in your system until tomorrow, so for the next 24 hours you should not drive, make any legal decisions or drink any alcoholic beverages.  You may eat anything you prefer, but it is better to start with liquids then soups and crackers, and gradually work up to solid foods.  Please notify your doctor immediately if you have any unusual bleeding, trouble breathing or pain that is not related to your normal pain.  Depending on the type of procedure that was done, some parts of your body may feel week and/or numb.  This usually clears up by tonight or the next day.  Walk with the use of an assistive device or accompanied by an adult for the 24 hours.  You may use ice on the affected area for the first 24 hours.  Put ice in a Ziploc bag and cover with a towel and place against area 15 minutes on 15 minutes off.  You may switch to heat after 24 hours.GENERAL RISKS AND COMPLICATIONS  What are the risk, side effects and possible complications? Generally speaking, most procedures are safe.  However, with any procedure there are risks, side effects, and the possibility of complications.  The risks and complications are dependent upon the sites that are lesioned, or the type of nerve block to be performed.  The closer the procedure is to the spine,  the more serious the risks are.  Great care is taken when placing the radio frequency needles, block needles or lesioning probes, but sometimes complications can occur. 1. Infection: Any time there is an injection through the skin, there is a risk of infection.  This is why sterile conditions are used for these blocks.  There are four possible types of infection. 1. Localized skin infection. 2. Central Nervous System Infection-This can be in the form of Meningitis, which can be deadly. 3. Epidural Infections-This can be in the form of an epidural abscess, which can cause pressure inside of the spine, causing compression of the spinal cord with subsequent paralysis. This would require an emergency surgery to decompress, and there are no guarantees that the patient would recover from the paralysis. 4. Discitis-This is an infection of the intervertebral discs.  It occurs in about 1% of discography procedures.  It is difficult to treat and it may lead to surgery.        2. Pain: the needles have to go through skin and soft tissues, will cause soreness.       3. Damage to internal structures:  The nerves to be lesioned may be near blood vessels or    other nerves which can be potentially damaged.       4. Bleeding: Bleeding is more common if the patient is taking blood thinners such as  aspirin, Coumadin, Ticiid, Plavix, etc., or if he/she have some genetic predisposition  such as   hemophilia. Bleeding into the spinal canal can cause compression of the spinal  cord with subsequent paralysis.  This would require an emergency surgery to  decompress and there are no guarantees that the patient would recover from the  paralysis.       5. Pneumothorax:  Puncturing of a lung is a possibility, every time a needle is introduced in  the area of the chest or upper back.  Pneumothorax refers to free air around the  collapsed lung(s), inside of the thoracic cavity (chest cavity).  Another two possible  complications  related to a similar event would include: Hemothorax and Chylothorax.   These are variations of the Pneumothorax, where instead of air around the collapsed  lung(s), you may have blood or chyle, respectively.       6. Spinal headaches: They may occur with any procedures in the area of the spine.       7. Persistent CSF (Cerebro-Spinal Fluid) leakage: This is a rare problem, but may occur  with prolonged intrathecal or epidural catheters either due to the formation of a fistulous  track or a dural tear.       8. Nerve damage: By working so close to the spinal cord, there is always a possibility of  nerve damage, which could be as serious as a permanent spinal cord injury with  paralysis.       9. Death:  Although rare, severe deadly allergic reactions known as "Anaphylactic  reaction" can occur to any of the medications used.      10. Worsening of the symptoms:  We can always make thing worse.  What are the chances of something like this happening? Chances of any of this occuring are extremely low.  By statistics, you have more of a chance of getting killed in a motor vehicle accident: while driving to the hospital than any of the above occurring .  Nevertheless, you should be aware that they are possibilities.  In general, it is similar to taking a shower.  Everybody knows that you can slip, hit your head and get killed.  Does that mean that you should not shower again?  Nevertheless always keep in mind that statistics do not mean anything if you happen to be on the wrong side of them.  Even if a procedure has a 1 (one) in a 1,000,000 (million) chance of going wrong, it you happen to be that one..Also, keep in mind that by statistics, you have more of a chance of having something go wrong when taking medications.  Who should not have this procedure? If you are on a blood thinning medication (e.g. Coumadin, Plavix, see list of "Blood Thinners"), or if you have an active infection going on, you should not  have the procedure.  If you are taking any blood thinners, please inform your physician.  How should I prepare for this procedure?  Do not eat or drink anything at least six hours prior to the procedure.  Bring a driver with you .  It cannot be a taxi.  Come accompanied by an adult that can drive you back, and that is strong enough to help you if your legs get weak or numb from the local anesthetic.  Take all of your medicines the morning of the procedure with just enough water to swallow them.  If you have diabetes, make sure that you are scheduled to have your procedure done first thing in the morning, whenever possible.  If you have diabetes,   take only half of your insulin dose and notify our nurse that you have done so as soon as you arrive at the clinic.  If you are diabetic, but only take blood sugar pills (oral hypoglycemic), then do not take them on the morning of your procedure.  You may take them after you have had the procedure.  Do not take aspirin or any aspirin-containing medications, at least eleven (11) days prior to the procedure.  They may prolong bleeding.  Wear loose fitting clothing that may be easy to take off and that you would not mind if it got stained with Betadine or blood.  Do not wear any jewelry or perfume  Remove any nail coloring.  It will interfere with some of our monitoring equipment.  NOTE: Remember that this is not meant to be interpreted as a complete list of all possible complications.  Unforeseen problems may occur.  BLOOD THINNERS The following drugs contain aspirin or other products, which can cause increased bleeding during surgery and should not be taken for 2 weeks prior to and 1 week after surgery.  If you should need take something for relief of minor pain, you may take acetaminophen which is found in Tylenol,m Datril, Anacin-3 and Panadol. It is not blood thinner. The products listed below are.  Do not take any of the products listed below  in addition to any listed on your instruction sheet.  A.P.C or A.P.C with Codeine Codeine Phosphate Capsules #3 Ibuprofen Ridaura  ABC compound Congesprin Imuran rimadil  Advil Cope Indocin Robaxisal  Alka-Seltzer Effervescent Pain Reliever and Antacid Coricidin or Coricidin-D  Indomethacin Rufen  Alka-Seltzer plus Cold Medicine Cosprin Ketoprofen S-A-C Tablets  Anacin Analgesic Tablets or Capsules Coumadin Korlgesic Salflex  Anacin Extra Strength Analgesic tablets or capsules CP-2 Tablets Lanoril Salicylate  Anaprox Cuprimine Capsules Levenox Salocol  Anexsia-D Dalteparin Magan Salsalate  Anodynos Darvon compound Magnesium Salicylate Sine-off  Ansaid Dasin Capsules Magsal Sodium Salicylate  Anturane Depen Capsules Marnal Soma  APF Arthritis pain formula Dewitt's Pills Measurin Stanback  Argesic Dia-Gesic Meclofenamic Sulfinpyrazone  Arthritis Bayer Timed Release Aspirin Diclofenac Meclomen Sulindac  Arthritis pain formula Anacin Dicumarol Medipren Supac  Analgesic (Safety coated) Arthralgen Diffunasal Mefanamic Suprofen  Arthritis Strength Bufferin Dihydrocodeine Mepro Compound Suprol  Arthropan liquid Dopirydamole Methcarbomol with Aspirin Synalgos  ASA tablets/Enseals Disalcid Micrainin Tagament  Ascriptin Doan's Midol Talwin  Ascriptin A/D Dolene Mobidin Tanderil  Ascriptin Extra Strength Dolobid Moblgesic Ticlid  Ascriptin with Codeine Doloprin or Doloprin with Codeine Momentum Tolectin  Asperbuf Duoprin Mono-gesic Trendar  Aspergum Duradyne Motrin or Motrin IB Triminicin  Aspirin plain, buffered or enteric coated Durasal Myochrisine Trigesic  Aspirin Suppositories Easprin Nalfon Trillsate  Aspirin with Codeine Ecotrin Regular or Extra Strength Naprosyn Uracel  Atromid-S Efficin Naproxen Ursinus  Auranofin Capsules Elmiron Neocylate Vanquish  Axotal Emagrin Norgesic Verin  Azathioprine Empirin or Empirin with Codeine Normiflo Vitamin E  Azolid Emprazil Nuprin Voltaren  Bayer  Aspirin plain, buffered or children's or timed BC Tablets or powders Encaprin Orgaran Warfarin Sodium  Buff-a-Comp Enoxaparin Orudis Zorpin  Buff-a-Comp with Codeine Equegesic Os-Cal-Gesic   Buffaprin Excedrin plain, buffered or Extra Strength Oxalid   Bufferin Arthritis Strength Feldene Oxphenbutazone   Bufferin plain or Extra Strength Feldene Capsules Oxycodone with Aspirin   Bufferin with Codeine Fenoprofen Fenoprofen Pabalate or Pabalate-SF   Buffets II Flogesic Panagesic   Buffinol plain or Extra Strength Florinal or Florinal with Codeine Panwarfarin   Buf-Tabs Flurbiprofen Penicillamine   Butalbital Compound Four-way cold tablets   Penicillin   Butazolidin Fragmin Pepto-Bismol   Carbenicillin Geminisyn Percodan   Carna Arthritis Reliever Geopen Persantine   Carprofen Gold's salt Persistin   Chloramphenicol Goody's Phenylbutazone   Chloromycetin Haltrain Piroxlcam   Clmetidine heparin Plaquenil   Cllnoril Hyco-pap Ponstel   Clofibrate Hydroxy chloroquine Propoxyphen         Before stopping any of these medications, be sure to consult the physician who ordered them.  Some, such as Coumadin (Warfarin) are ordered to prevent or treat serious conditions such as "deep thrombosis", "pumonary embolisms", and other heart problems.  The amount of time that you may need off of the medication may also vary with the medication and the reason for which you were taking it.  If you are taking any of these medications, please make sure you notify your pain physician before you undergo any procedures.         Facet Joint Block The facet joints connect the bones of the spine (vertebrae). They make it possible for you to bend, twist, and make other movements with your spine. They also prevent you from overbending, overtwisting, and making other excessive movements.  A facet joint block is a procedure where a numbing medicine (anesthetic) is injected into a facet joint. Often, a type of  anti-inflammatory medicine called a steroid is also injected. A facet joint block may be done for two reasons:  2. Diagnosis. A facet joint block may be done as a test to see whether neck or back pain is caused by a worn-down or infected facet joint. If the pain gets better after a facet joint block, it means the pain is probably coming from the facet joint. If the pain does not get better, it means the pain is probably not coming from the facet joint.  3. Therapy. A facet joint block may be done to relieve neck or back pain caused by a facet joint. A facet joint block is only done as a therapy if the pain does not improve with medicine, exercise programs, physical therapy, and other forms of pain management. LET YOUR HEALTH CARE PROVIDER KNOW ABOUT:   Any allergies you have.   All medicines you are taking, including vitamins, herbs, eyedrops, and over-the-counter medicines and creams.   Previous problems you or members of your family have had with the use of anesthetics.   Any blood disorders you have had.   Other health problems you have. RISKS AND COMPLICATIONS Generally, having a facet joint block is safe. However, as with any procedure, complications can occur. Possible complications associated with having a facet joint block include:   Bleeding.   Injury to a nerve near the injection site.   Pain at the injection site.   Weakness or numbness in areas controlled by nerves near the injection site.   Infection.   Temporary fluid retention.   Allergic reaction to anesthetics or medicines used during the procedure. BEFORE THE PROCEDURE   Follow your health care provider's instructions if you are taking dietary supplements or medicines. You may need to stop taking them or reduce your dosage.   Do not take any new dietary supplements or medicines without asking your health care provider first.   Follow your health care provider's instructions about eating and drinking  before the procedure. You may need to stop eating and drinking several hours before the procedure.   Arrange to have an adult drive you home after the procedure. PROCEDURE 12. You may need to remove your clothing and   dress in an open-back gown so that your health care provider can access your spine.  13. The procedure will be done while you are lying on an X-ray table. Most of the time you will be asked to lie on your stomach, but you may be asked to lie in a different position if an injection will be made in your neck.  14. Special machines will be used to monitor your oxygen levels, heart rate, and blood pressure.  15. If an injection will be made in your neck, an intravenous (IV) tube will be inserted into one of your veins. Fluids and medicine will flow directly into your body through the IV tube.  16. The area over the facet joint where the injection will be made will be cleaned with an antiseptic soap. The surrounding skin will be covered with sterile drapes.  17. An anesthetic will be applied to your skin to make the injection area numb. You may feel a temporary stinging or burning sensation.  18. A video X-ray machine will be used to locate the joint. A contrast dye may be injected into the facet joint area to help with locating the joint.  19. When the joint is located, an anesthetic medicine will be injected into the joint through the needle.  20. Your health care provider will ask you whether you feel pain relief. If you do feel relief, a steroid may be injected to provide pain relief for a longer period of time. If you do not feel relief or feel only partial relief, additional injections of an anesthetic may be made in other facet joints.  21. The needle will be removed, the skin will be cleansed, and bandages will be applied.  AFTER THE PROCEDURE   You will be observed for 15-30 minutes before being allowed to go home. Do not drive. Have an adult drive you or take a taxi or  public transportation instead.   If you feel pain relief, the pain will return in several hours or days when the anesthetic wears off.   You may feel pain relief 2-14 days after the procedure. The amount of time this relief lasts varies from person to person.   It is normal to feel some tenderness over the injected area(s) for 2 days following the procedure.   If you have diabetes, you may have a temporary increase in blood sugar.   This information is not intended to replace advice given to you by your health care provider. Make sure you discuss any questions you have with your health care provider.   Document Released: 10/21/2006 Document Revised: 06/22/2014 Document Reviewed: 03/21/2012 [employer] Interactive Patient Education 2016 [employer]  

## 2015-04-14 NOTE — Progress Notes (Deleted)
Patient's Name: Alice Simmons MRN: [id number] DOB: [birth year] DOS: 04/11/2015  Primary Reason(s) for Visit: Post-Procedure evaluation. CC: Hip Pain; Leg Pain; Back Pain; and Shoulder Pain   HPI:   Alice Simmons is a 59 y.o. year old, female patient, who returns today as an established patient. She has Hypertension goal BP (blood pressure) < 140/90; Anxiety; Hypothyroidism; Apnea, sleep; Obesity; Depression; Arthritis; Tobacco abuse; Screening for breast cancer; Migraine headache; Chronic insomnia; Breast pain in female; Bipolar disorder, current episode depressed, severe, with psychotic features ([place]); Self-inflicted injury; Clinical depression; Diabetes mellitus type 2, controlled, without complications ([place]); Mass of left breast on mammogram; Bilateral tinnitus; Back pain, thoracic; Hyperlipidemia LDL goal <100; Vitamin D deficiency; Primary meningioma of optic nerve sheath ([place]); Bronchospasm; Marijuana use; Continuous illicit drug use; Chronic low back pain; Chronic pain syndrome; Lumbar facet syndrome; Low back pain; History of migraine headaches; Lumbar radicular pain; Lumbosacral radiculopathy; Right leg pain; Lumbar spondylosis; and Acute low back pain on her problem list.. Her primarily concern today is the Hip Pain; Leg Pain; Back Pain; and Shoulder Pain   Today's Pain Score: 10-Worst pain ever Pain Type: Chronic pain Pain Location: Hip Pain Orientation: Right, Left Pain Descriptors / Indicators: Aching, Burning, Constant, Pins and needles, Penetrating, Radiating, Crushing, Crying, Tingling Pain Frequency: Constant   ***  Pharmacotherapy Review: The patient's medication management is currently not under our care. Last UDS available in the system:     Component Value Date/Time   LABOPIA NEGATIVE 12/10/2013 1711   LABBENZ POSITIVE 12/10/2013 1711   AMPHETMU NEGATIVE 12/10/2013 1711   THCU NEGATIVE 12/10/2013 1711   LABBARB NEGATIVE 12/10/2013 1711   Post-Procedure Assessment:   Procedure done on last visit: *** Side-effects or Adverse reactions: No significant issues reported. Sedation: Procedure was performed with sedation.  Results: Ultra-Short Term Relief (First 1 hour after procedure): 100 % Short Term Relief (Initial 4-6 hrs after procedure): 60 % Long Term Relief : 30 %  Current Relief (Now):  50% Interpretation of Results: Ultra-short term relief is a normal physiological response to analgesics and anesthetics provided during the procedure. Short-term relief confirms injected site as etiology of pain. Long term relief is possibly due to sympathetic blockade, or the effects of steroids, if administered during procedure. Persistent relief would suggest effective anti-inflammatory effects from steroids.  Allergies: Alice Simmons is allergic to aspirin and penicillins.  Meds: The patient has a current medication list which includes the following prescription(s): amlodipine, gabapentin, labetalol, losartan, paroxetine, topiramate, and zolpidem. Requested Prescriptions    No prescriptions requested or ordered in this encounter    ROS: Constitutional: Afebrile, no chills, well hydrated and well nourished Gastrointestinal: negative Musculoskeletal:negative Neurological: negative Behavioral/Psych: negative  PFSH: Medical:  Alice Simmons  has a past medical history of Hypertension; Anxiety; Obesity (BMI 30.0-34.9); Hypertension, benign; Depression, controlled; and Pain of both breasts. Family: family history includes Breast cancer in her paternal aunt; Diabetes in her father and mother; Heart disease in her brother and mother; Hyperlipidemia in her brother, brother, brother, brother, and sister; Hypertension in her father and mother. Surgical:  has past surgical history that includes Abdominal hysterectomy; Carpal tunnel release; Breast surgery; Breast biopsy (Bilateral, 1980); Breast biopsy (Right, 1985); and Eye surgery. Tobacco:  reports that she has been  smoking Cigarettes.  She has a 18 pack-year smoking history. She does not have any smokeless tobacco history on file. Alcohol:  reports that she drinks about 0.6 oz of alcohol per week. Drug:  reports that she uses illicit drugs.  Physical Exam: Vitals:  Today's Vitals   04/11/15 1445 04/11/15 1450  BP: 120/62   Pulse: 80   Temp: 98.4 F (36.9 C)   TempSrc: Oral   Resp: 19   Height: 5\' 2"  (1.575 m)   Weight: 135 lb (61.236 kg)   SpO2: 100%   PainSc: 10-Worst pain ever 10-Worst pain ever  Calculated BMI: Body mass index is 24.69 kg/(m^2). General appearance: {general exam:16600} Eyes: conjunctivae/corneas clear. PERRL, EOM's intact. Fundi benign. Lungs: No evidence respiratory distress, no audible rales or ronchi and no use of accessory muscles of respiration Neck: no adenopathy, no carotid bruit, no JVD, supple, symmetrical, trachea midline and thyroid not enlarged, symmetric, no tenderness/mass/nodules Back: symmetric, no curvature. ROM normal. No CVA tenderness. Extremities: {extremity exam:5109} Pulses: 2+ and symmetric Skin: Skin color, texture, turgor normal. No rashes or lesions Neurologic: {neuro exam:17854}    Assessment: Encounter Diagnosis:  Primary Diagnosis: Acute low back pain [M54.5]  Plan: Alice Simmons was seen today for hip pain, leg pain, back pain and shoulder pain.  Diagnoses and all orders for this visit:  Acute low back pain -     orphenadrine (NORFLEX) injection 60 mg; Inject 2 mLs (60 mg total) into the muscle once.  Lumbar facet syndrome -     LUMBAR FACET(MEDIAL BRANCH NERVE BLOCK) MBNB; Future     Patient Instructions   Pain Management Discharge Instructions  General Discharge Instructions :  If you need to reach your doctor call: Monday-Friday 8:00 am - 4:00 pm at [phone] or toll free [phone].  After clinic hours [phone] to have operator reach doctor.  Bring all of your medication bottles to all your appointments in the pain  clinic.  To cancel or reschedule your appointment with Pain Management please remember to call 24 hours in advance to avoid a fee.  Refer to the educational materials which you have been given on: General Risks, I had my Procedure. Discharge Instructions, Post Sedation.  Post Procedure Instructions:  The drugs you were given will stay in your system until tomorrow, so for the next 24 hours you should not drive, make any legal decisions or drink any alcoholic beverages.  You may eat anything you prefer, but it is better to start with liquids then soups and crackers, and gradually work up to solid foods.  Please notify your doctor immediately if you have any unusual bleeding, trouble breathing or pain that is not related to your normal pain.  Depending on the type of procedure that was done, some parts of your body may feel week and/or numb.  This usually clears up by tonight or the next day.  Walk with the use of an assistive device or accompanied by an adult for the 24 hours.  You may use ice on the affected area for the first 24 hours.  Put ice in a Ziploc bag and cover with a towel and place against area 15 minutes on 15 minutes off.  You may switch to heat after 24 hours.GENERAL RISKS AND COMPLICATIONS  What are the risk, side effects and possible complications? Generally speaking, most procedures are safe.  However, with any procedure there are risks, side effects, and the possibility of complications.  The risks and complications are dependent upon the sites that are lesioned, or the type of nerve block to be performed.  The closer the procedure is to the spine, the more serious the risks are.  Great care is taken when placing the radio frequency needles, block needles or  lesioning probes, but sometimes complications can occur. 1. Infection: Any time there is an injection through the skin, there is a risk of infection.  This is why sterile conditions are used for these blocks.  There are  four possible types of infection. 1. Localized skin infection. 2. Central Nervous System Infection-This can be in the form of Meningitis, which can be deadly. 3. Epidural Infections-This can be in the form of an epidural abscess, which can cause pressure inside of the spine, causing compression of the spinal cord with subsequent paralysis. This would require an emergency surgery to decompress, and there are no guarantees that the patient would recover from the paralysis. 4. Discitis-This is an infection of the intervertebral discs.  It occurs in about 1% of discography procedures.  It is difficult to treat and it may lead to surgery.        2. Pain: the needles have to go through skin and soft tissues, will cause soreness.       3. Damage to internal structures:  The nerves to be lesioned may be near blood vessels or    other nerves which can be potentially damaged.       4. Bleeding: Bleeding is more common if the patient is taking blood thinners such as  aspirin, Coumadin, Ticiid, Plavix, etc., or if he/she have some genetic predisposition  such as hemophilia. Bleeding into the spinal canal can cause compression of the spinal  cord with subsequent paralysis.  This would require an emergency surgery to  decompress and there are no guarantees that the patient would recover from the  paralysis.       5. Pneumothorax:  Puncturing of a lung is a possibility, every time a needle is introduced in  the area of the chest or upper back.  Pneumothorax refers to free air around the  collapsed lung(s), inside of the thoracic cavity (chest cavity).  Another two possible  complications related to a similar event would include: Hemothorax and Chylothorax.   These are variations of the Pneumothorax, where instead of air around the collapsed  lung(s), you may have blood or chyle, respectively.       6. Spinal headaches: They may occur with any procedures in the area of the spine.       7. Persistent CSF (Cerebro-Spinal  Fluid) leakage: This is a rare problem, but may occur  with prolonged intrathecal or epidural catheters either due to the formation of a fistulous  track or a dural tear.       8. Nerve damage: By working so close to the spinal cord, there is always a possibility of  nerve damage, which could be as serious as a permanent spinal cord injury with  paralysis.       9. Death:  Although rare, severe deadly allergic reactions known as "Anaphylactic  reaction" can occur to any of the medications used.      10. Worsening of the symptoms:  We can always make thing worse.  What are the chances of something like this happening? Chances of any of this occuring are extremely low.  By statistics, you have more of a chance of getting killed in a motor vehicle accident: while driving to the hospital than any of the above occurring .  Nevertheless, you should be aware that they are possibilities.  In general, it is similar to taking a shower.  Everybody knows that you can slip, hit your head and get killed.  Does that mean  that you should not shower again?  Nevertheless always keep in mind that statistics do not mean anything if you happen to be on the wrong side of them.  Even if a procedure has a 1 (one) in a 1,000,000 (million) chance of going wrong, it you happen to be that one..Also, keep in mind that by statistics, you have more of a chance of having something go wrong when taking medications.  Who should not have this procedure? If you are on a blood thinning medication (e.g. Coumadin, Plavix, see list of "Blood Thinners"), or if you have an active infection going on, you should not have the procedure.  If you are taking any blood thinners, please inform your physician.  How should I prepare for this procedure?  Do not eat or drink anything at least six hours prior to the procedure.  Bring a driver with you .  It cannot be a taxi.  Come accompanied by an adult that can drive you back, and that is strong  enough to help you if your legs get weak or numb from the local anesthetic.  Take all of your medicines the morning of the procedure with just enough water to swallow them.  If you have diabetes, make sure that you are scheduled to have your procedure done first thing in the morning, whenever possible.  If you have diabetes, take only half of your insulin dose and notify our nurse that you have done so as soon as you arrive at the clinic.  If you are diabetic, but only take blood sugar pills (oral hypoglycemic), then do not take them on the morning of your procedure.  You may take them after you have had the procedure.  Do not take aspirin or any aspirin-containing medications, at least eleven (11) days prior to the procedure.  They may prolong bleeding.  Wear loose fitting clothing that may be easy to take off and that you would not mind if it got stained with Betadine or blood.  Do not wear any jewelry or perfume  Remove any nail coloring.  It will interfere with some of our monitoring equipment.  NOTE: Remember that this is not meant to be interpreted as a complete list of all possible complications.  Unforeseen problems may occur.  BLOOD THINNERS The following drugs contain aspirin or other products, which can cause increased bleeding during surgery and should not be taken for 2 weeks prior to and 1 week after surgery.  If you should need take something for relief of minor pain, you may take acetaminophen which is found in Tylenol,m Datril, Anacin-3 and Panadol. It is not blood thinner. The products listed below are.  Do not take any of the products listed below in addition to any listed on your instruction sheet.  A.P.C or A.P.C with Codeine Codeine Phosphate Capsules #3 Ibuprofen Ridaura  ABC compound Congesprin Imuran rimadil  Advil Cope Indocin Robaxisal  Alka-Seltzer Effervescent Pain Reliever and Antacid Coricidin or Coricidin-D  Indomethacin Rufen  Alka-Seltzer plus Cold  Medicine Cosprin Ketoprofen S-A-C Tablets  Anacin Analgesic Tablets or Capsules Coumadin Korlgesic Salflex  Anacin Extra Strength Analgesic tablets or capsules CP-2 Tablets Lanoril Salicylate  Anaprox Cuprimine Capsules Levenox Salocol  Anexsia-D Dalteparin Magan Salsalate  Anodynos Darvon compound Magnesium Salicylate Sine-off  Ansaid Dasin Capsules Magsal Sodium Salicylate  Anturane Depen Capsules Marnal Soma  APF Arthritis pain formula Dewitt's Pills Measurin Stanback  Argesic Dia-Gesic Meclofenamic Sulfinpyrazone  Arthritis Bayer Timed Release Aspirin Diclofenac Meclomen Sulindac  Arthritis pain formula Anacin Dicumarol Medipren Supac  Analgesic (Safety coated) Arthralgen Diffunasal Mefanamic Suprofen  Arthritis Strength Bufferin Dihydrocodeine Mepro Compound Suprol  Arthropan liquid Dopirydamole Methcarbomol with Aspirin Synalgos  ASA tablets/Enseals Disalcid Micrainin Tagament  Ascriptin Doan's Midol Talwin  Ascriptin A/D Dolene Mobidin Tanderil  Ascriptin Extra Strength Dolobid Moblgesic Ticlid  Ascriptin with Codeine Doloprin or Doloprin with Codeine Momentum Tolectin  Asperbuf Duoprin Mono-gesic Trendar  Aspergum Duradyne Motrin or Motrin IB Triminicin  Aspirin plain, buffered or enteric coated Durasal Myochrisine Trigesic  Aspirin Suppositories Easprin Nalfon Trillsate  Aspirin with Codeine Ecotrin Regular or Extra Strength Naprosyn Uracel  Atromid-S Efficin Naproxen Ursinus  Auranofin Capsules Elmiron Neocylate Vanquish  Axotal Emagrin Norgesic Verin  Azathioprine Empirin or Empirin with Codeine Normiflo Vitamin E  Azolid Emprazil Nuprin Voltaren  Bayer Aspirin plain, buffered or children's or timed BC Tablets or powders Encaprin Orgaran Warfarin Sodium  Buff-a-Comp Enoxaparin Orudis Zorpin  Buff-a-Comp with Codeine Equegesic Os-Cal-Gesic   Buffaprin Excedrin plain, buffered or Extra Strength Oxalid   Bufferin Arthritis Strength Feldene Oxphenbutazone   Bufferin plain or  Extra Strength Feldene Capsules Oxycodone with Aspirin   Bufferin with Codeine Fenoprofen Fenoprofen Pabalate or Pabalate-SF   Buffets II Flogesic Panagesic   Buffinol plain or Extra Strength Florinal or Florinal with Codeine Panwarfarin   Buf-Tabs Flurbiprofen Penicillamine   Butalbital Compound Four-way cold tablets Penicillin   Butazolidin Fragmin Pepto-Bismol   Carbenicillin Geminisyn Percodan   Carna Arthritis Reliever Geopen Persantine   Carprofen Gold's salt Persistin   Chloramphenicol Goody's Phenylbutazone   Chloromycetin Haltrain Piroxlcam   Clmetidine heparin Plaquenil   Cllnoril Hyco-pap Ponstel   Clofibrate Hydroxy chloroquine Propoxyphen         Before stopping any of these medications, be sure to consult the physician who ordered them.  Some, such as Coumadin (Warfarin) are ordered to prevent or treat serious conditions such as "deep thrombosis", "pumonary embolisms", and other heart problems.  The amount of time that you may need off of the medication may also vary with the medication and the reason for which you were taking it.  If you are taking any of these medications, please make sure you notify your pain physician before you undergo any procedures.         Facet Joint Block The facet joints connect the bones of the spine (vertebrae). They make it possible for you to bend, twist, and make other movements with your spine. They also prevent you from overbending, overtwisting, and making other excessive movements.  A facet joint block is a procedure where a numbing medicine (anesthetic) is injected into a facet joint. Often, a type of anti-inflammatory medicine called a steroid is also injected. A facet joint block may be done for two reasons:  2. Diagnosis. A facet joint block may be done as a test to see whether neck or back pain is caused by a worn-down or infected facet joint. If the pain gets better after a facet joint block, it means the pain is probably coming  from the facet joint. If the pain does not get better, it means the pain is probably not coming from the facet joint.  3. Therapy. A facet joint block may be done to relieve neck or back pain caused by a facet joint. A facet joint block is only done as a therapy if the pain does not improve with medicine, exercise programs, physical therapy, and other forms of pain management. LET [facility] CARE PROVIDER KNOW ABOUT:  Any allergies you have.   All medicines you are taking, including vitamins, herbs, eyedrops, and over-the-counter medicines and creams.   Previous problems you or members of your family have had with the use of anesthetics.   Any blood disorders you have had.   Other health problems you have. RISKS AND COMPLICATIONS Generally, having a facet joint block is safe. However, as with any procedure, complications can occur. Possible complications associated with having a facet joint block include:   Bleeding.   Injury to a nerve near the injection site.   Pain at the injection site.   Weakness or numbness in areas controlled by nerves near the injection site.   Infection.   Temporary fluid retention.   Allergic reaction to anesthetics or medicines used during the procedure. BEFORE THE PROCEDURE   Follow your health care provider's instructions if you are taking dietary supplements or medicines. You may need to stop taking them or reduce your dosage.   Do not take any new dietary supplements or medicines without asking your health care provider first.   Follow your health care provider's instructions about eating and drinking before the procedure. You may need to stop eating and drinking several hours before the procedure.   Arrange to have an adult drive you home after the procedure. PROCEDURE 12. You may need to remove your clothing and dress in an open-back gown so that your health care provider can access your spine.  13. The procedure will be done  while you are lying on an X-ray table. Most of the time you will be asked to lie on your stomach, but you may be asked to lie in a different position if an injection will be made in your neck.  14. Special machines will be used to monitor your oxygen levels, heart rate, and blood pressure.  15. If an injection will be made in your neck, an intravenous (IV) tube will be inserted into one of your veins. Fluids and medicine will flow directly into your body through the IV tube.  16. The area over the facet joint where the injection will be made will be cleaned with an antiseptic soap. The surrounding skin will be covered with sterile drapes.  17. An anesthetic will be applied to your skin to make the injection area numb. You may feel a temporary stinging or burning sensation.  18. A video X-ray machine will be used to locate the joint. A contrast dye may be injected into the facet joint area to help with locating the joint.  19. When the joint is located, an anesthetic medicine will be injected into the joint through the needle.  48. Your health care provider will ask you whether you feel pain relief. If you do feel relief, a steroid may be injected to provide pain relief for a longer period of time. If you do not feel relief or feel only partial relief, additional injections of an anesthetic may be made in other facet joints.  21. The needle will be removed, the skin will be cleansed, and bandages will be applied.  AFTER THE PROCEDURE   You will be observed for 15-30 minutes before being allowed to go home. Do not drive. Have an adult drive you or take a taxi or public transportation instead.   If you feel pain relief, the pain will return in several hours or days when the anesthetic wears off.   You may feel pain relief 2-14 days after the procedure. The amount of  time this relief lasts varies from person to person.   It is normal to feel some tenderness over the injected area(s) for 2  days following the procedure.   If you have diabetes, you may have a temporary increase in blood sugar.   This information is not intended to replace advice given to you by your health care provider. Make sure you discuss any questions you have with your health care provider.   Document Released: 10/21/2006 Document Revised: 06/22/2014 Document Reviewed: 03/21/2012 Elsevier Interactive Patient Education 2016 [employer].   Medications discontinued today:  Medications Discontinued During This Encounter  Medication Reason  . azithromycin (ZITHROMAX) 250 MG tablet Error  . fentaNYL (DURAGESIC - DOSED MCG/HR) 12 MCG/HR Error  . fluticasone (FLONASE) 50 MCG/ACT nasal spray Error  . ibuprofen (ADVIL,MOTRIN) 800 MG tablet Error  . levothyroxine (SYNTHROID, LEVOTHROID) 100 MCG tablet Error  . omeprazole (PRILOSEC) 20 MG capsule Error  . predniSONE (STERAPRED UNI-PAK 21 TAB) 10 MG (21) TBPK tablet Error  . tiZANidine (ZANAFLEX) 4 MG tablet Error  . VIRTUSSIN A/C 100-10 MG/5ML syrup Error   Medications administered today:  We administered orphenadrine.  Primary Care Physician: Bobetta Lime, MD Location: [facility] Note by: Kathlen Brunswick. Dossie Arbour, M.D, DABA, DABAPM, DABPM, DABIPP, FIPP

## 2015-04-20 DIAGNOSIS — Z8659 Personal history of other mental and behavioral disorders: Secondary | ICD-10-CM | POA: Insufficient documentation

## 2015-04-20 DIAGNOSIS — F17200 Nicotine dependence, unspecified, uncomplicated: Secondary | ICD-10-CM | POA: Insufficient documentation

## 2015-04-20 DIAGNOSIS — Z789 Other specified health status: Secondary | ICD-10-CM | POA: Insufficient documentation

## 2015-04-20 NOTE — Progress Notes (Signed)
Patient's Name: Alice Simmons MRN: [id number] DOB: [birth year] DOS: 04/11/2015  Primary Reason(s) for Visit: Post-Procedure evaluation. CC: Hip Pain; Leg Pain; Back Pain; and Shoulder Pain   HPI:   Alice Simmons is a 59 y.o. year old, female patient, who returns today as an established patient. She has Hypertension goal BP (blood pressure) < 140/90; Anxiety; Hypothyroidism; Apnea, sleep; Obesity; Depression; Arthritis; Tobacco abuse; Screening for breast cancer; Migraine headache; Chronic insomnia; Breast pain in female; Bipolar disorder, current episode depressed, severe, with psychotic features ([place]); Self-inflicted injury; Clinical depression; Diabetes mellitus type 2, controlled, without complications ([place]); Mass of left breast on mammogram; Bilateral tinnitus; Back pain, thoracic; Hyperlipidemia LDL goal <100; Vitamin D deficiency; Primary meningioma of optic nerve sheath ([place]); Bronchospasm; Marijuana use; Continuous illicit drug use; Chronic low back pain; Chronic pain syndrome; Lumbar facet syndrome; Low back pain; History of migraine headaches; Lumbar radicular pain; Lumbosacral radiculopathy; Right leg pain; Lumbar spondylosis; Acute low back pain; Nicotine dependence; History of psychiatric disorder; Fibromyalgia; Paresthesias; Paresthesias with subjective weakness; Chronic pain; Sensitivity to sunlight; and History of photophobia on her problem list.. Her primarily concern today is the Hip Pain; Leg Pain; Back Pain; and Shoulder Pain    Today she was with her husband and her symptoms and demeanor was completely different than when she comes by herself. She was crying and clearly in a depressive phase of her bipolar condition. Today's Pain Score: 10-Worst pain ever Clear symptom exaggeration. Reported level of pain is incompatible with clinical observations. Pain Type: Chronic pain Pain Location: Hip Pain Orientation: Right, Left Pain Descriptors / Indicators: Aching, Burning, Constant,  Pins and needles, Penetrating, Radiating, Crushing, Crying, Tingling Pain Frequency: Constant  Date of Last Visit:   Service Provided on Last Visit:    Pharmacotherapy Review: The patient's medication management is currently not under our care. Last UDS available in the system:     Component Value Date/Time   LABOPIA NEGATIVE 12/10/2013 1711   LABBENZ POSITIVE 12/10/2013 1711   AMPHETMU NEGATIVE 12/10/2013 1711   THCU NEGATIVE 12/10/2013 1711   LABBARB NEGATIVE 12/10/2013 1711   Post-Procedure Assessment:  Procedure done on last visit: Diagnostic lumbar facet block. Side-effects or Adverse reactions: No significant issues reported. Sedation: No sedation used during procedure.  Results: Ultra-Short Term Relief (First 1 hour after procedure): 100 % Short Term Relief (Initial 4-6 hrs after procedure): 60 % Long Term Relief : 30 %  Current Relief (Now):  50% Interpretation of Results: Ultra-short term relief is a normal physiological response to analgesics and anesthetics provided during the procedure. Short-term relief confirms injected site as etiology of pain. Long term relief is possibly due to sympathetic blockade, or the effects of steroids, if administered during procedure. Persistent relief would suggest effective anti-inflammatory effects from steroids. We'll repeat diagnostic block.  Allergies: Alice Simmons is allergic to aspirin and penicillins.  Meds: The patient has a current medication list which includes the following prescription(s): amlodipine, gabapentin, labetalol, losartan, paroxetine, topiramate, and zolpidem. Requested Prescriptions    No prescriptions requested or ordered in this encounter    ROS: Constitutional: Afebrile, no chills, well hydrated and well nourished Gastrointestinal: negative Musculoskeletal:negative Neurological: negative Behavioral/Psych: negative  PFSH: Medical:  Alice Simmons  has a past medical history of Hypertension; Anxiety;  Obesity (BMI 30.0-34.9); Hypertension, benign; Depression, controlled; and Pain of both breasts. Family: family history includes Breast cancer in her paternal aunt; Diabetes in her father and mother; Heart disease in her brother and mother; Hyperlipidemia in her brother, brother,  brother, brother, and sister; Hypertension in her father and mother. Surgical:  has past surgical history that includes Abdominal hysterectomy; Carpal tunnel release; Breast surgery; Breast biopsy (Bilateral, 1980); Breast biopsy (Right, 1985); and Eye surgery. Tobacco:  reports that she has been smoking Cigarettes.  She has a 18 pack-year smoking history. She does not have any smokeless tobacco history on file. Alcohol:  reports that she drinks about 0.6 oz of alcohol per week. Drug:  reports that she uses illicit drugs.  Physical Exam: Vitals:  Today's Vitals   04/11/15 1445 04/11/15 1450  BP: 120/62   Pulse: 80   Temp: 98.4 F (36.9 C)   TempSrc: Oral   Resp: 19   Height: 5\' 2"  (1.575 m)   Weight: 135 lb (61.236 kg)   SpO2: 100%   PainSc: 10-Worst pain ever 10-Worst pain ever  Calculated BMI: Body mass index is 24.69 kg/(m^2). General appearance: alert, appears stated age, distracted and mild distress Eyes: conjunctivae/corneas clear. PERRL, EOM's intact. Fundi benign. Lungs: No evidence respiratory distress, no audible rales or ronchi and no use of accessory muscles of respiration Neck: no adenopathy, no carotid bruit, no JVD, supple, symmetrical, trachea midline and thyroid not enlarged, symmetric, no tenderness/mass/nodules Back: symmetric, no curvature. ROM normal. No CVA tenderness. Extremities: extremities normal, atraumatic, no cyanosis or edema Pulses: 2+ and symmetric Skin: Skin color, texture, turgor normal. No rashes or lesions Neurologic: Grossly normal   Assessment: Encounter Diagnosis:  Primary Diagnosis: Acute low back pain [M54.5]  Plan: Alice Simmons was seen today for hip pain, leg pain,  back pain and shoulder pain.  Diagnoses and all orders for this visit:  Acute low back pain -     orphenadrine (NORFLEX) injection 60 mg; Inject 2 mLs (60 mg total) into the muscle once.  Lumbar facet syndrome -     LUMBAR FACET(MEDIAL BRANCH NERVE BLOCK) MBNB; Future  Dependence on nicotine from other tobacco product  History of psychiatric disorder  Fibromyalgia  Paresthesias  Paresthesias with subjective weakness  Chronic pain  Sensitivity to sunlight  History of photophobia     Patient Instructions   Pain Management Discharge Instructions  General Discharge Instructions :  If you need to reach your doctor call: Monday-Friday 8:00 am - 4:00 pm at [phone] or toll free [phone].  After clinic hours [phone] to have operator reach doctor.  Bring all of your medication bottles to all your appointments in the pain clinic.  To cancel or reschedule your appointment with Pain Management please remember to call 24 hours in advance to avoid a fee.  Refer to the educational materials which you have been given on: General Risks, I had my Procedure. Discharge Instructions, Post Sedation.  Post Procedure Instructions:  The drugs you were given will stay in your system until tomorrow, so for the next 24 hours you should not drive, make any legal decisions or drink any alcoholic beverages.  You may eat anything you prefer, but it is better to start with liquids then soups and crackers, and gradually work up to solid foods.  Please notify your doctor immediately if you have any unusual bleeding, trouble breathing or pain that is not related to your normal pain.  Depending on the type of procedure that was done, some parts of your body may feel week and/or numb.  This usually clears up by tonight or the next day.  Walk with the use of an assistive device or accompanied by an adult for the 24 hours.  You  may use ice on the affected area for the first 24 hours.   Put ice in a Ziploc bag and cover with a towel and place against area 15 minutes on 15 minutes off.  You may switch to heat after 24 hours.GENERAL RISKS AND COMPLICATIONS  What are the risk, side effects and possible complications? Generally speaking, most procedures are safe.  However, with any procedure there are risks, side effects, and the possibility of complications.  The risks and complications are dependent upon the sites that are lesioned, or the type of nerve block to be performed.  The closer the procedure is to the spine, the more serious the risks are.  Great care is taken when placing the radio frequency needles, block needles or lesioning probes, but sometimes complications can occur. 1. Infection: Any time there is an injection through the skin, there is a risk of infection.  This is why sterile conditions are used for these blocks.  There are four possible types of infection. 1. Localized skin infection. 2. Central Nervous System Infection-This can be in the form of Meningitis, which can be deadly. 3. Epidural Infections-This can be in the form of an epidural abscess, which can cause pressure inside of the spine, causing compression of the spinal cord with subsequent paralysis. This would require an emergency surgery to decompress, and there are no guarantees that the patient would recover from the paralysis. 4. Discitis-This is an infection of the intervertebral discs.  It occurs in about 1% of discography procedures.  It is difficult to treat and it may lead to surgery.        2. Pain: the needles have to go through skin and soft tissues, will cause soreness.       3. Damage to internal structures:  The nerves to be lesioned may be near blood vessels or    other nerves which can be potentially damaged.       4. Bleeding: Bleeding is more common if the patient is taking blood thinners such as  aspirin, Coumadin, Ticiid, Plavix, etc., or if he/she have some genetic predisposition  such  as hemophilia. Bleeding into the spinal canal can cause compression of the spinal  cord with subsequent paralysis.  This would require an emergency surgery to  decompress and there are no guarantees that the patient would recover from the  paralysis.       5. Pneumothorax:  Puncturing of a lung is a possibility, every time a needle is introduced in  the area of the chest or upper back.  Pneumothorax refers to free air around the  collapsed lung(s), inside of the thoracic cavity (chest cavity).  Another two possible  complications related to a similar event would include: Hemothorax and Chylothorax.   These are variations of the Pneumothorax, where instead of air around the collapsed  lung(s), you may have blood or chyle, respectively.       6. Spinal headaches: They may occur with any procedures in the area of the spine.       7. Persistent CSF (Cerebro-Spinal Fluid) leakage: This is a rare problem, but may occur  with prolonged intrathecal or epidural catheters either due to the formation of a fistulous  track or a dural tear.       8. Nerve damage: By working so close to the spinal cord, there is always a possibility of  nerve damage, which could be as serious as a permanent spinal cord injury with  paralysis.  9. Death:  Although rare, severe deadly allergic reactions known as "Anaphylactic  reaction" can occur to any of the medications used.      10. Worsening of the symptoms:  We can always make thing worse.  What are the chances of something like this happening? Chances of any of this occuring are extremely low.  By statistics, you have more of a chance of getting killed in a motor vehicle accident: while driving to the hospital than any of the above occurring .  Nevertheless, you should be aware that they are possibilities.  In general, it is similar to taking a shower.  Everybody knows that you can slip, hit your head and get killed.  Does that mean that you should not shower again?   Nevertheless always keep in mind that statistics do not mean anything if you happen to be on the wrong side of them.  Even if a procedure has a 1 (one) in a 1,000,000 (million) chance of going wrong, it you happen to be that one..Also, keep in mind that by statistics, you have more of a chance of having something go wrong when taking medications.  Who should not have this procedure? If you are on a blood thinning medication (e.g. Coumadin, Plavix, see list of "Blood Thinners"), or if you have an active infection going on, you should not have the procedure.  If you are taking any blood thinners, please inform your physician.  How should I prepare for this procedure?  Do not eat or drink anything at least six hours prior to the procedure.  Bring a driver with you .  It cannot be a taxi.  Come accompanied by an adult that can drive you back, and that is strong enough to help you if your legs get weak or numb from the local anesthetic.  Take all of your medicines the morning of the procedure with just enough water to swallow them.  If you have diabetes, make sure that you are scheduled to have your procedure done first thing in the morning, whenever possible.  If you have diabetes, take only half of your insulin dose and notify our nurse that you have done so as soon as you arrive at the clinic.  If you are diabetic, but only take blood sugar pills (oral hypoglycemic), then do not take them on the morning of your procedure.  You may take them after you have had the procedure.  Do not take aspirin or any aspirin-containing medications, at least eleven (11) days prior to the procedure.  They may prolong bleeding.  Wear loose fitting clothing that may be easy to take off and that you would not mind if it got stained with Betadine or blood.  Do not wear any jewelry or perfume  Remove any nail coloring.  It will interfere with some of our monitoring equipment.  NOTE: Remember that this is not  meant to be interpreted as a complete list of all possible complications.  Unforeseen problems may occur.  BLOOD THINNERS The following drugs contain aspirin or other products, which can cause increased bleeding during surgery and should not be taken for 2 weeks prior to and 1 week after surgery.  If you should need take something for relief of minor pain, you may take acetaminophen which is found in Tylenol,m Datril, Anacin-3 and Panadol. It is not blood thinner. The products listed below are.  Do not take any of the products listed below in addition to any listed on  your instruction sheet.  A.P.C or A.P.C with Codeine Codeine Phosphate Capsules #3 Ibuprofen Ridaura  ABC compound Congesprin Imuran rimadil  Advil Cope Indocin Robaxisal  Alka-Seltzer Effervescent Pain Reliever and Antacid Coricidin or Coricidin-D  Indomethacin Rufen  Alka-Seltzer plus Cold Medicine Cosprin Ketoprofen S-A-C Tablets  Anacin Analgesic Tablets or Capsules Coumadin Korlgesic Salflex  Anacin Extra Strength Analgesic tablets or capsules CP-2 Tablets Lanoril Salicylate  Anaprox Cuprimine Capsules Levenox Salocol  Anexsia-D Dalteparin Magan Salsalate  Anodynos Darvon compound Magnesium Salicylate Sine-off  Ansaid Dasin Capsules Magsal Sodium Salicylate  Anturane Depen Capsules Marnal Soma  APF Arthritis pain formula Dewitt's Pills Measurin Stanback  Argesic Dia-Gesic Meclofenamic Sulfinpyrazone  Arthritis Bayer Timed Release Aspirin Diclofenac Meclomen Sulindac  Arthritis pain formula Anacin Dicumarol Medipren Supac  Analgesic (Safety coated) Arthralgen Diffunasal Mefanamic Suprofen  Arthritis Strength Bufferin Dihydrocodeine Mepro Compound Suprol  Arthropan liquid Dopirydamole Methcarbomol with Aspirin Synalgos  ASA tablets/Enseals Disalcid Micrainin Tagament  Ascriptin Doan's Midol Talwin  Ascriptin A/D Dolene Mobidin Tanderil  Ascriptin Extra Strength Dolobid Moblgesic Ticlid  Ascriptin with Codeine Doloprin or  Doloprin with Codeine Momentum Tolectin  Asperbuf Duoprin Mono-gesic Trendar  Aspergum Duradyne Motrin or Motrin IB Triminicin  Aspirin plain, buffered or enteric coated Durasal Myochrisine Trigesic  Aspirin Suppositories Easprin Nalfon Trillsate  Aspirin with Codeine Ecotrin Regular or Extra Strength Naprosyn Uracel  Atromid-S Efficin Naproxen Ursinus  Auranofin Capsules Elmiron Neocylate Vanquish  Axotal Emagrin Norgesic Verin  Azathioprine Empirin or Empirin with Codeine Normiflo Vitamin E  Azolid Emprazil Nuprin Voltaren  Bayer Aspirin plain, buffered or children's or timed BC Tablets or powders Encaprin Orgaran Warfarin Sodium  Buff-a-Comp Enoxaparin Orudis Zorpin  Buff-a-Comp with Codeine Equegesic Os-Cal-Gesic   Buffaprin Excedrin plain, buffered or Extra Strength Oxalid   Bufferin Arthritis Strength Feldene Oxphenbutazone   Bufferin plain or Extra Strength Feldene Capsules Oxycodone with Aspirin   Bufferin with Codeine Fenoprofen Fenoprofen Pabalate or Pabalate-SF   Buffets II Flogesic Panagesic   Buffinol plain or Extra Strength Florinal or Florinal with Codeine Panwarfarin   Buf-Tabs Flurbiprofen Penicillamine   Butalbital Compound Four-way cold tablets Penicillin   Butazolidin Fragmin Pepto-Bismol   Carbenicillin Geminisyn Percodan   Carna Arthritis Reliever Geopen Persantine   Carprofen Gold's salt Persistin   Chloramphenicol Goody's Phenylbutazone   Chloromycetin Haltrain Piroxlcam   Clmetidine heparin Plaquenil   Cllnoril Hyco-pap Ponstel   Clofibrate Hydroxy chloroquine Propoxyphen         Before stopping any of these medications, be sure to consult the physician who ordered them.  Some, such as Coumadin (Warfarin) are ordered to prevent or treat serious conditions such as "deep thrombosis", "pumonary embolisms", and other heart problems.  The amount of time that you may need off of the medication may also vary with the medication and the reason for which you were  taking it.  If you are taking any of these medications, please make sure you notify your pain physician before you undergo any procedures.         Facet Joint Block The facet joints connect the bones of the spine (vertebrae). They make it possible for you to bend, twist, and make other movements with your spine. They also prevent you from overbending, overtwisting, and making other excessive movements.  A facet joint block is a procedure where a numbing medicine (anesthetic) is injected into a facet joint. Often, a type of anti-inflammatory medicine called a steroid is also injected. A facet joint block may be done for two reasons:  2.  Diagnosis. A facet joint block may be done as a test to see whether neck or back pain is caused by a worn-down or infected facet joint. If the pain gets better after a facet joint block, it means the pain is probably coming from the facet joint. If the pain does not get better, it means the pain is probably not coming from the facet joint.  3. Therapy. A facet joint block may be done to relieve neck or back pain caused by a facet joint. A facet joint block is only done as a therapy if the pain does not improve with medicine, exercise programs, physical therapy, and other forms of pain management. LET [facility] CARE PROVIDER KNOW ABOUT:   Any allergies you have.   All medicines you are taking, including vitamins, herbs, eyedrops, and over-the-counter medicines and creams.   Previous problems you or members of your family have had with the use of anesthetics.   Any blood disorders you have had.   Other health problems you have. RISKS AND COMPLICATIONS Generally, having a facet joint block is safe. However, as with any procedure, complications can occur. Possible complications associated with having a facet joint block include:   Bleeding.   Injury to a nerve near the injection site.   Pain at the injection site.   Weakness or numbness in areas  controlled by nerves near the injection site.   Infection.   Temporary fluid retention.   Allergic reaction to anesthetics or medicines used during the procedure. BEFORE THE PROCEDURE   Follow your health care provider's instructions if you are taking dietary supplements or medicines. You may need to stop taking them or reduce your dosage.   Do not take any new dietary supplements or medicines without asking your health care provider first.   Follow your health care provider's instructions about eating and drinking before the procedure. You may need to stop eating and drinking several hours before the procedure.   Arrange to have an adult drive you home after the procedure. PROCEDURE 12. You may need to remove your clothing and dress in an open-back gown so that your health care provider can access your spine.  13. The procedure will be done while you are lying on an X-ray table. Most of the time you will be asked to lie on your stomach, but you may be asked to lie in a different position if an injection will be made in your neck.  14. Special machines will be used to monitor your oxygen levels, heart rate, and blood pressure.  15. If an injection will be made in your neck, an intravenous (IV) tube will be inserted into one of your veins. Fluids and medicine will flow directly into your body through the IV tube.  16. The area over the facet joint where the injection will be made will be cleaned with an antiseptic soap. The surrounding skin will be covered with sterile drapes.  17. An anesthetic will be applied to your skin to make the injection area numb. You may feel a temporary stinging or burning sensation.  18. A video X-ray machine will be used to locate the joint. A contrast dye may be injected into the facet joint area to help with locating the joint.  19. When the joint is located, an anesthetic medicine will be injected into the joint through the needle.  90. Your  health care provider will ask you whether you feel pain relief. If you do feel relief,  a steroid may be injected to provide pain relief for a longer period of time. If you do not feel relief or feel only partial relief, additional injections of an anesthetic may be made in other facet joints.  21. The needle will be removed, the skin will be cleansed, and bandages will be applied.  AFTER THE PROCEDURE   You will be observed for 15-30 minutes before being allowed to go home. Do not drive. Have an adult drive you or take a taxi or public transportation instead.   If you feel pain relief, the pain will return in several hours or days when the anesthetic wears off.   You may feel pain relief 2-14 days after the procedure. The amount of time this relief lasts varies from person to person.   It is normal to feel some tenderness over the injected area(s) for 2 days following the procedure.   If you have diabetes, you may have a temporary increase in blood sugar.   This information is not intended to replace advice given to you by your health care provider. Make sure you discuss any questions you have with your health care provider.   Document Released: 10/21/2006 Document Revised: 06/22/2014 Document Reviewed: 03/21/2012 Elsevier Interactive Patient Education 2016 [employer].   Medications discontinued today:  Medications Discontinued During This Encounter  Medication Reason  . azithromycin (ZITHROMAX) 250 MG tablet Error  . fentaNYL (DURAGESIC - DOSED MCG/HR) 12 MCG/HR Error  . fluticasone (FLONASE) 50 MCG/ACT nasal spray Error  . ibuprofen (ADVIL,MOTRIN) 800 MG tablet Error  . levothyroxine (SYNTHROID, LEVOTHROID) 100 MCG tablet Error  . omeprazole (PRILOSEC) 20 MG capsule Error  . predniSONE (STERAPRED UNI-PAK 21 TAB) 10 MG (21) TBPK tablet Error  . tiZANidine (ZANAFLEX) 4 MG tablet Error  . VIRTUSSIN A/C 100-10 MG/5ML syrup Error   Medications administered today:  We  administered orphenadrine.  Primary Care Physician: Bobetta Lime, MD Location: [facility] Note by: Kathlen Brunswick. Dossie Arbour, M.D, DABA, DABAPM, DABPM, DABIPP, FIPP

## 2015-04-25 ENCOUNTER — Encounter: Payer: Self-pay | Admitting: Pain Medicine

## 2015-04-25 ENCOUNTER — Ambulatory Visit: Payer: Medicare Other | Attending: Pain Medicine | Admitting: Pain Medicine

## 2015-04-25 VITALS — BP 103/53 | HR 58 | Temp 98.2°F | Resp 20 | Ht 62.0 in | Wt 125.0 lb

## 2015-04-25 DIAGNOSIS — F419 Anxiety disorder, unspecified: Secondary | ICD-10-CM | POA: Insufficient documentation

## 2015-04-25 DIAGNOSIS — E039 Hypothyroidism, unspecified: Secondary | ICD-10-CM | POA: Insufficient documentation

## 2015-04-25 DIAGNOSIS — E119 Type 2 diabetes mellitus without complications: Secondary | ICD-10-CM | POA: Insufficient documentation

## 2015-04-25 DIAGNOSIS — E785 Hyperlipidemia, unspecified: Secondary | ICD-10-CM | POA: Diagnosis not present

## 2015-04-25 DIAGNOSIS — Z8659 Personal history of other mental and behavioral disorders: Secondary | ICD-10-CM

## 2015-04-25 DIAGNOSIS — F17200 Nicotine dependence, unspecified, uncomplicated: Secondary | ICD-10-CM | POA: Diagnosis not present

## 2015-04-25 DIAGNOSIS — M546 Pain in thoracic spine: Secondary | ICD-10-CM | POA: Diagnosis present

## 2015-04-25 DIAGNOSIS — I10 Essential (primary) hypertension: Secondary | ICD-10-CM | POA: Diagnosis not present

## 2015-04-25 DIAGNOSIS — F1210 Cannabis abuse, uncomplicated: Secondary | ICD-10-CM

## 2015-04-25 DIAGNOSIS — E669 Obesity, unspecified: Secondary | ICD-10-CM | POA: Insufficient documentation

## 2015-04-25 DIAGNOSIS — M797 Fibromyalgia: Secondary | ICD-10-CM | POA: Diagnosis not present

## 2015-04-25 DIAGNOSIS — F319 Bipolar disorder, unspecified: Secondary | ICD-10-CM | POA: Diagnosis not present

## 2015-04-25 DIAGNOSIS — M4726 Other spondylosis with radiculopathy, lumbar region: Secondary | ICD-10-CM

## 2015-04-25 DIAGNOSIS — M47816 Spondylosis without myelopathy or radiculopathy, lumbar region: Secondary | ICD-10-CM

## 2015-04-25 DIAGNOSIS — M47817 Spondylosis without myelopathy or radiculopathy, lumbosacral region: Secondary | ICD-10-CM | POA: Insufficient documentation

## 2015-04-25 DIAGNOSIS — M545 Low back pain: Secondary | ICD-10-CM | POA: Diagnosis not present

## 2015-04-25 MED ORDER — TRIAMCINOLONE ACETONIDE 40 MG/ML IJ SUSP
INTRAMUSCULAR | Status: AC
  Administered 2015-04-25: 12:00:00
  Filled 2015-04-25: qty 2

## 2015-04-25 MED ORDER — MIDAZOLAM HCL 5 MG/5ML IJ SOLN: 5.0000 mg | Freq: Once | INTRAMUSCULAR | Status: DC

## 2015-04-25 MED ORDER — ROPIVACAINE HCL 2 MG/ML IJ SOLN: 9.0000 mL | Freq: Once | INTRAMUSCULAR | Status: DC

## 2015-04-25 MED ORDER — FENTANYL CITRATE (PF) 100 MCG/2ML IJ SOLN
INTRAMUSCULAR | Status: AC
  Administered 2015-04-25: 100 ug via INTRAVENOUS
  Filled 2015-04-25: qty 2

## 2015-04-25 MED ORDER — LACTATED RINGERS IV SOLN: 1000.0000 mL | INTRAVENOUS | Status: AC

## 2015-04-25 MED ORDER — TRIAMCINOLONE ACETONIDE 40 MG/ML IJ SUSP: 40.0000 mg | Freq: Once | INTRAMUSCULAR | Status: DC

## 2015-04-25 MED ORDER — LIDOCAINE HCL (PF) 1 % IJ SOLN: 10.0000 mL | Freq: Once | INTRAMUSCULAR | Status: DC

## 2015-04-25 MED ORDER — ROPIVACAINE HCL 2 MG/ML IJ SOLN
INTRAMUSCULAR | Status: AC
  Administered 2015-04-25: 12:00:00
  Filled 2015-04-25: qty 20

## 2015-04-25 MED ORDER — MIDAZOLAM HCL 5 MG/5ML IJ SOLN
INTRAMUSCULAR | Status: AC
  Administered 2015-04-25: 4 mg via INTRAVENOUS
  Filled 2015-04-25: qty 5

## 2015-04-25 MED ORDER — FENTANYL CITRATE (PF) 100 MCG/2ML IJ SOLN: 100.0000 ug | Freq: Once | INTRAMUSCULAR | Status: DC

## 2015-04-25 NOTE — Progress Notes (Signed)
Safety precautions to be maintained throughout the outpatient stay will include: orient to surroundings, keep bed in low position, maintain call bell within reach at all times, provide assistance with transfer out of bed and ambulation.  

## 2015-04-25 NOTE — Patient Instructions (Signed)
Pain Management Discharge Instructions  General Discharge Instructions :  If you need to reach your doctor call: Monday-Friday 8:00 am - 4:00 pm at [phone] or toll free [phone].  After clinic hours [phone] to have operator reach doctor.  Bring all of your medication bottles to all your appointments in the pain clinic.  To cancel or reschedule your appointment with Pain Management please remember to call 24 hours in advance to avoid a fee.  Refer to the educational materials which you have been given on: General Risks, I had my Procedure. Discharge Instructions, Post Sedation.  Post Procedure Instructions:  The drugs you were given will stay in your system until tomorrow, so for the next 24 hours you should not drive, make any legal decisions or drink any alcoholic beverages.  You may eat anything you prefer, but it is better to start with liquids then soups and crackers, and gradually work up to solid foods.  Please notify your doctor immediately if you have any unusual bleeding, trouble breathing or pain that is not related to your normal pain.  Depending on the type of procedure that was done, some parts of your body may feel week and/or numb.  This usually clears up by tonight or the next day.  Walk with the use of an assistive device or accompanied by an adult for the 24 hours.  You may use ice on the affected area for the first 24 hours.  Put ice in a Ziploc bag and cover with a towel and place against area 15 minutes on 15 minutes off.  You may switch to heat after 24 hours.Facet Joint Block The facet joints connect the bones of the spine (vertebrae). They make it possible for you to bend, twist, and make other movements with your spine. They also prevent you from overbending, overtwisting, and making other excessive movements.  A facet joint block is a procedure where a numbing medicine (anesthetic) is injected into a facet joint. Often, a type of anti-inflammatory  medicine called a steroid is also injected. A facet joint block may be done for two reasons:   Diagnosis. A facet joint block may be done as a test to see whether neck or back pain is caused by a worn-down or infected facet joint. If the pain gets better after a facet joint block, it means the pain is probably coming from the facet joint. If the pain does not get better, it means the pain is probably not coming from the facet joint.   Therapy. A facet joint block may be done to relieve neck or back pain caused by a facet joint. A facet joint block is only done as a therapy if the pain does not improve with medicine, exercise programs, physical therapy, and other forms of pain management. LET [facility] CARE PROVIDER KNOW ABOUT:   Any allergies you have.   All medicines you are taking, including vitamins, herbs, eyedrops, and over-the-counter medicines and creams.   Previous problems you or members of your family have had with the use of anesthetics.   Any blood disorders you have had.   Other health problems you have. RISKS AND COMPLICATIONS Generally, having a facet joint block is safe. However, as with any procedure, complications can occur. Possible complications associated with having a facet joint block include:   Bleeding.   Injury to a nerve near the injection site.   Pain at the injection site.   Weakness or numbness in areas controlled by nerves near  the injection site.   Infection.   Temporary fluid retention.   Allergic reaction to anesthetics or medicines used during the procedure. BEFORE THE PROCEDURE   Follow your health care provider's instructions if you are taking dietary supplements or medicines. You may need to stop taking them or reduce your dosage.   Do not take any new dietary supplements or medicines without asking your health care provider first.   Follow your health care provider's instructions about eating and drinking before the  procedure. You may need to stop eating and drinking several hours before the procedure.   Arrange to have an adult drive you home after the procedure. PROCEDURE  You may need to remove your clothing and dress in an open-back gown so that your health care provider can access your spine.   The procedure will be done while you are lying on an X-ray table. Most of the time you will be asked to lie on your stomach, but you may be asked to lie in a different position if an injection will be made in your neck.   Special machines will be used to monitor your oxygen levels, heart rate, and blood pressure.   If an injection will be made in your neck, an intravenous (IV) tube will be inserted into one of your veins. Fluids and medicine will flow directly into your body through the IV tube.   The area over the facet joint where the injection will be made will be cleaned with an antiseptic soap. The surrounding skin will be covered with sterile drapes.   An anesthetic will be applied to your skin to make the injection area numb. You may feel a temporary stinging or burning sensation.   A video X-ray machine will be used to locate the joint. A contrast dye may be injected into the facet joint area to help with locating the joint.   When the joint is located, an anesthetic medicine will be injected into the joint through the needle.   Your health care provider will ask you whether you feel pain relief. If you do feel relief, a steroid may be injected to provide pain relief for a longer period of time. If you do not feel relief or feel only partial relief, additional injections of an anesthetic may be made in other facet joints.   The needle will be removed, the skin will be cleansed, and bandages will be applied.  AFTER THE PROCEDURE   You will be observed for 15-30 minutes before being allowed to go home. Do not drive. Have an adult drive you or take a taxi or public transportation instead.    If you feel pain relief, the pain will return in several hours or days when the anesthetic wears off.   You may feel pain relief 2-14 days after the procedure. The amount of time this relief lasts varies from person to person.   It is normal to feel some tenderness over the injected area(s) for 2 days following the procedure.   If you have diabetes, you may have a temporary increase in blood sugar.   This information is not intended to replace advice given to you by your health care provider. Make sure you discuss any questions you have with your health care provider.   Document Released: 10/21/2006 Document Revised: 06/22/2014 Document Reviewed: 03/21/2012 Elsevier Interactive Patient Education 2016 [employer] GENERAL RISKS AND COMPLICATIONS  What are the risk, side effects and possible complications? Generally speaking, most procedures are   safe.  However, with any procedure there are risks, side effects, and the possibility of complications.  The risks and complications are dependent upon the sites that are lesioned, or the type of nerve block to be performed.  The closer the procedure is to the spine, the more serious the risks are.  Great care is taken when placing the radio frequency needles, block needles or lesioning probes, but sometimes complications can occur.  Infection: Any time there is an injection through the skin, there is a risk of infection.  This is why sterile conditions are used for these blocks.  There are four possible types of infection.  Localized skin infection.  Central Nervous System Infection-This can be in the form of Meningitis, which can be deadly.  Epidural Infections-This can be in the form of an epidural abscess, which can cause pressure inside of the spine, causing compression of the spinal cord with subsequent paralysis. This would require an emergency surgery to decompress, and there are no guarantees that the patient would recover from the  paralysis.  Discitis-This is an infection of the intervertebral discs.  It occurs in about 1% of discography procedures.  It is difficult to treat and it may lead to surgery.        2. Pain: the needles have to go through skin and soft tissues, will cause soreness.       3. Damage to internal structures:  The nerves to be lesioned may be near blood vessels or    other nerves which can be potentially damaged.       4. Bleeding: Bleeding is more common if the patient is taking blood thinners such as  aspirin, Coumadin, Ticiid, Plavix, etc., or if he/she have some genetic predisposition  such as hemophilia. Bleeding into the spinal canal can cause compression of the spinal  cord with subsequent paralysis.  This would require an emergency surgery to  decompress and there are no guarantees that the patient would recover from the  paralysis.       5. Pneumothorax:  Puncturing of a lung is a possibility, every time a needle is introduced in  the area of the chest or upper back.  Pneumothorax refers to free air around the  collapsed lung(s), inside of the thoracic cavity (chest cavity).  Another two possible  complications related to a similar event would include: Hemothorax and Chylothorax.   These are variations of the Pneumothorax, where instead of air around the collapsed  lung(s), you may have blood or chyle, respectively.       6. Spinal headaches: They may occur with any procedures in the area of the spine.       7. Persistent CSF (Cerebro-Spinal Fluid) leakage: This is a rare problem, but may occur  with prolonged intrathecal or epidural catheters either due to the formation of a fistulous  track or a dural tear.       8. Nerve damage: By working so close to the spinal cord, there is always a possibility of  nerve damage, which could be as serious as a permanent spinal cord injury with  paralysis.       9. Death:  Although rare, severe deadly allergic reactions known as "Anaphylactic  reaction" can occur  to any of the medications used.      10. Worsening of the symptoms:  We can always make thing worse.  What are the chances of something like this happening? Chances of any of this occuring are extremely   low.  By statistics, you have more of a chance of getting killed in a motor vehicle accident: while driving to the hospital than any of the above occurring .  Nevertheless, you should be aware that they are possibilities.  In general, it is similar to taking a shower.  Everybody knows that you can slip, hit your head and get killed.  Does that mean that you should not shower again?  Nevertheless always keep in mind that statistics do not mean anything if you happen to be on the wrong side of them.  Even if a procedure has a 1 (one) in a 1,000,000 (million) chance of going wrong, it you happen to be that one..Also, keep in mind that by statistics, you have more of a chance of having something go wrong when taking medications.  Who should not have this procedure? If you are on a blood thinning medication (e.g. Coumadin, Plavix, see list of "Blood Thinners"), or if you have an active infection going on, you should not have the procedure.  If you are taking any blood thinners, please inform your physician.  How should I prepare for this procedure?  Do not eat or drink anything at least six hours prior to the procedure.  Bring a driver with you .  It cannot be a taxi.  Come accompanied by an adult that can drive you back, and that is strong enough to help you if your legs get weak or numb from the local anesthetic.  Take all of your medicines the morning of the procedure with just enough water to swallow them.  If you have diabetes, make sure that you are scheduled to have your procedure done first thing in the morning, whenever possible.  If you have diabetes, take only half of your insulin dose and notify our nurse that you have done so as soon as you arrive at the clinic.  If you are diabetic,  but only take blood sugar pills (oral hypoglycemic), then do not take them on the morning of your procedure.  You may take them after you have had the procedure.  Do not take aspirin or any aspirin-containing medications, at least eleven (11) days prior to the procedure.  They may prolong bleeding.  Wear loose fitting clothing that may be easy to take off and that you would not mind if it got stained with Betadine or blood.  Do not wear any jewelry or perfume  Remove any nail coloring.  It will interfere with some of our monitoring equipment.  NOTE: Remember that this is not meant to be interpreted as a complete list of all possible complications.  Unforeseen problems may occur.  BLOOD THINNERS The following drugs contain aspirin or other products, which can cause increased bleeding during surgery and should not be taken for 2 weeks prior to and 1 week after surgery.  If you should need take something for relief of minor pain, you may take acetaminophen which is found in Tylenol,m Datril, Anacin-3 and Panadol. It is not blood thinner. The products listed below are.  Do not take any of the products listed below in addition to any listed on your instruction sheet.  A.P.C or A.P.C with Codeine Codeine Phosphate Capsules #3 Ibuprofen Ridaura  ABC compound Congesprin Imuran rimadil  Advil Cope Indocin Robaxisal  Alka-Seltzer Effervescent Pain Reliever and Antacid Coricidin or Coricidin-D  Indomethacin Rufen  Alka-Seltzer plus Cold Medicine Cosprin Ketoprofen S-A-C Tablets  Anacin Analgesic Tablets or Capsules Coumadin Korlgesic Salflex    Anacin Extra Strength Analgesic tablets or capsules CP-2 Tablets Lanoril Salicylate  Anaprox Cuprimine Capsules Levenox Salocol  Anexsia-D Dalteparin Magan Salsalate  Anodynos Darvon compound Magnesium Salicylate Sine-off  Ansaid Dasin Capsules Magsal Sodium Salicylate  Anturane Depen Capsules Marnal Soma  APF Arthritis pain formula Dewitt's Pills Measurin  Stanback  Argesic Dia-Gesic Meclofenamic Sulfinpyrazone  Arthritis Bayer Timed Release Aspirin Diclofenac Meclomen Sulindac  Arthritis pain formula Anacin Dicumarol Medipren Supac  Analgesic (Safety coated) Arthralgen Diffunasal Mefanamic Suprofen  Arthritis Strength Bufferin Dihydrocodeine Mepro Compound Suprol  Arthropan liquid Dopirydamole Methcarbomol with Aspirin Synalgos  ASA tablets/Enseals Disalcid Micrainin Tagament  Ascriptin Doan's Midol Talwin  Ascriptin A/D Dolene Mobidin Tanderil  Ascriptin Extra Strength Dolobid Moblgesic Ticlid  Ascriptin with Codeine Doloprin or Doloprin with Codeine Momentum Tolectin  Asperbuf Duoprin Mono-gesic Trendar  Aspergum Duradyne Motrin or Motrin IB Triminicin  Aspirin plain, buffered or enteric coated Durasal Myochrisine Trigesic  Aspirin Suppositories Easprin Nalfon Trillsate  Aspirin with Codeine Ecotrin Regular or Extra Strength Naprosyn Uracel  Atromid-S Efficin Naproxen Ursinus  Auranofin Capsules Elmiron Neocylate Vanquish  Axotal Emagrin Norgesic Verin  Azathioprine Empirin or Empirin with Codeine Normiflo Vitamin E  Azolid Emprazil Nuprin Voltaren  Bayer Aspirin plain, buffered or children's or timed BC Tablets or powders Encaprin Orgaran Warfarin Sodium  Buff-a-Comp Enoxaparin Orudis Zorpin  Buff-a-Comp with Codeine Equegesic Os-Cal-Gesic   Buffaprin Excedrin plain, buffered or Extra Strength Oxalid   Bufferin Arthritis Strength Feldene Oxphenbutazone   Bufferin plain or Extra Strength Feldene Capsules Oxycodone with Aspirin   Bufferin with Codeine Fenoprofen Fenoprofen Pabalate or Pabalate-SF   Buffets II Flogesic Panagesic   Buffinol plain or Extra Strength Florinal or Florinal with Codeine Panwarfarin   Buf-Tabs Flurbiprofen Penicillamine   Butalbital Compound Four-way cold tablets Penicillin   Butazolidin Fragmin Pepto-Bismol   Carbenicillin Geminisyn Percodan   Carna Arthritis Reliever Geopen Persantine   Carprofen Gold's  salt Persistin   Chloramphenicol Goody's Phenylbutazone   Chloromycetin Haltrain Piroxlcam   Clmetidine heparin Plaquenil   Cllnoril Hyco-pap Ponstel   Clofibrate Hydroxy chloroquine Propoxyphen         Before stopping any of these medications, be sure to consult the physician who ordered them.  Some, such as Coumadin (Warfarin) are ordered to prevent or treat serious conditions such as "deep thrombosis", "pumonary embolisms", and other heart problems.  The amount of time that you may need off of the medication may also vary with the medication and the reason for which you were taking it.  If you are taking any of these medications, please make sure you notify your pain physician before you undergo any procedures.          
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
## 2015-04-26 ENCOUNTER — Telehealth: Payer: Self-pay | Admitting: *Deleted

## 2015-04-26 NOTE — Telephone Encounter (Signed)
Left voice mail to call with questions or concerns after procedure on yesterday.

## 2015-04-29 DIAGNOSIS — H02834 Dermatochalasis of left upper eyelid: Secondary | ICD-10-CM | POA: Diagnosis not present

## 2015-04-29 DIAGNOSIS — H02831 Dermatochalasis of right upper eyelid: Secondary | ICD-10-CM | POA: Diagnosis not present

## 2015-04-29 DIAGNOSIS — G513 Clonic hemifacial spasm: Secondary | ICD-10-CM | POA: Diagnosis not present

## 2015-04-29 DIAGNOSIS — H02413 Mechanical ptosis of bilateral eyelids: Secondary | ICD-10-CM | POA: Diagnosis not present

## 2015-04-29 DIAGNOSIS — H53483 Generalized contraction of visual field, bilateral: Secondary | ICD-10-CM | POA: Diagnosis not present

## 2015-05-07 ENCOUNTER — Ambulatory Visit: Payer: Medicare Other | Attending: Pain Medicine | Admitting: Pain Medicine

## 2015-05-07 ENCOUNTER — Encounter: Payer: Self-pay | Admitting: Pain Medicine

## 2015-05-07 VITALS — BP 117/70 | HR 65 | Temp 98.0°F | Resp 18 | Ht 62.0 in

## 2015-05-07 DIAGNOSIS — M549 Dorsalgia, unspecified: Secondary | ICD-10-CM | POA: Diagnosis present

## 2015-05-07 DIAGNOSIS — G8929 Other chronic pain: Secondary | ICD-10-CM | POA: Diagnosis present

## 2015-05-07 DIAGNOSIS — M79673 Pain in unspecified foot: Secondary | ICD-10-CM | POA: Diagnosis present

## 2015-05-07 DIAGNOSIS — F419 Anxiety disorder, unspecified: Secondary | ICD-10-CM | POA: Diagnosis not present

## 2015-05-07 DIAGNOSIS — F17210 Nicotine dependence, cigarettes, uncomplicated: Secondary | ICD-10-CM | POA: Diagnosis not present

## 2015-05-07 DIAGNOSIS — I10 Essential (primary) hypertension: Secondary | ICD-10-CM | POA: Insufficient documentation

## 2015-05-07 DIAGNOSIS — F17290 Nicotine dependence, other tobacco product, uncomplicated: Secondary | ICD-10-CM

## 2015-05-07 DIAGNOSIS — F1990 Other psychoactive substance use, unspecified, uncomplicated: Secondary | ICD-10-CM

## 2015-05-07 DIAGNOSIS — G545 Neuralgic amyotrophy: Secondary | ICD-10-CM | POA: Insufficient documentation

## 2015-05-07 DIAGNOSIS — M791 Myalgia: Secondary | ICD-10-CM

## 2015-05-07 DIAGNOSIS — E785 Hyperlipidemia, unspecified: Secondary | ICD-10-CM | POA: Insufficient documentation

## 2015-05-07 DIAGNOSIS — M797 Fibromyalgia: Secondary | ICD-10-CM | POA: Diagnosis not present

## 2015-05-07 DIAGNOSIS — M792 Neuralgia and neuritis, unspecified: Secondary | ICD-10-CM | POA: Diagnosis not present

## 2015-05-07 DIAGNOSIS — F319 Bipolar disorder, unspecified: Secondary | ICD-10-CM | POA: Diagnosis not present

## 2015-05-07 DIAGNOSIS — M47816 Spondylosis without myelopathy or radiculopathy, lumbar region: Secondary | ICD-10-CM

## 2015-05-07 DIAGNOSIS — M545 Low back pain: Secondary | ICD-10-CM

## 2015-05-07 DIAGNOSIS — F315 Bipolar disorder, current episode depressed, severe, with psychotic features: Secondary | ICD-10-CM

## 2015-05-07 DIAGNOSIS — F1290 Cannabis use, unspecified, uncomplicated: Secondary | ICD-10-CM

## 2015-05-07 DIAGNOSIS — F1590 Other stimulant use, unspecified, uncomplicated: Secondary | ICD-10-CM

## 2015-05-07 DIAGNOSIS — E039 Hypothyroidism, unspecified: Secondary | ICD-10-CM | POA: Diagnosis not present

## 2015-05-07 DIAGNOSIS — F17200 Nicotine dependence, unspecified, uncomplicated: Secondary | ICD-10-CM

## 2015-05-07 DIAGNOSIS — E559 Vitamin D deficiency, unspecified: Secondary | ICD-10-CM

## 2015-05-07 DIAGNOSIS — R208 Other disturbances of skin sensation: Secondary | ICD-10-CM

## 2015-05-07 DIAGNOSIS — F1610 Hallucinogen abuse, uncomplicated: Secondary | ICD-10-CM

## 2015-05-07 DIAGNOSIS — M7918 Myalgia, other site: Secondary | ICD-10-CM

## 2015-05-07 DIAGNOSIS — F1210 Cannabis abuse, uncomplicated: Secondary | ICD-10-CM

## 2015-05-07 DIAGNOSIS — M4726 Other spondylosis with radiculopathy, lumbar region: Secondary | ICD-10-CM

## 2015-05-07 DIAGNOSIS — Z720 Tobacco use: Secondary | ICD-10-CM

## 2015-05-07 NOTE — Patient Instructions (Signed)
Radiofrequency Lesioning Radiofrequency lesioning is a procedure that is performed to relieve pain. The procedure is often used for back, neck, or arm pain. Radiofrequency lesioning involves the use of a machine that creates radio waves to make heat. During the procedure, the heat is applied to the nerve that carries the pain signal. The heat damages the nerve and interferes with the pain signal. Pain relief usually lasts for 6 months to 1 year. LET YOUR HEALTH CARE PROVIDER KNOW ABOUT:  Any allergies you have.  All medicines you are taking, including vitamins, herbs, eye drops, creams, and over-the-counter medicines.  Previous problems you or members of your family have had with the use of anesthetics.  Any blood disorders you have.  Previous surgeries you have had.  Any medical conditions you have.  Whether you are pregnant or may be pregnant. RISKS AND COMPLICATIONS Generally, this is a safe procedure. However, problems may occur, including:  Pain or soreness at the injection site.  Infection at the injection site.  Damage to nerves or blood vessels. BEFORE THE PROCEDURE  Ask your health care provider about:  Changing or stopping your regular medicines. This is especially important if you are taking diabetes medicines or blood thinners.  Taking medicines such as aspirin and ibuprofen. These medicines can thin your blood. Do not take these medicines before your procedure if your health care provider instructs you not to.  Follow instructions from your health care provider about eating or drinking restrictions.  Plan to have someone take you home after the procedure.  If you go home right after the procedure, plan to have someone with you for 24 hours. PROCEDURE  You will be given one or more of the following:  A medicine to help you relax (sedative).  A medicine to numb the area (local anesthetic).  You will be awake during the procedure. You will need to be able to  talk with the health care provider during the procedure.  With the help of a type of X-ray (fluoroscopy), the health care provider will insert a radiofrequency needle into the area to be treated.  Next, a wire that carries the radio waves (electrode) will be put through the radiofrequency needle. An electrical pulse will be sent through the electrode to verify the correct nerve. You will feel a tingling sensation, and you may have muscle twitching.  Then, the tissue that is around the needle tip will be heated by an electric current that is passed using the radiofrequency machine. This will numb the nerves.  A bandage (dressing) will be put on the insertion area after the procedure is done. The procedure may vary among health care providers and hospitals. AFTER THE PROCEDURE  Your blood pressure, heart rate, breathing rate, and blood oxygen level will be monitored often until the medicines you were given have worn off.  Return to your normal activities as directed by your health care provider.   This information is not intended to replace advice given to you by your health care provider. Make sure you discuss any questions you have with your health care provider.   Document Released: 01/28/2011 Document Revised: 02/20/2015 Document Reviewed: 07/09/2014 [employer] Interactive Patient Education 2016 [employer] GENERAL RISKS AND COMPLICATIONS  What are the risk, side effects and possible complications? Generally speaking, most procedures are safe.  However, with any procedure there are risks, side effects, and the possibility of complications.  The risks and complications are dependent upon the sites that are lesioned, or the   type of nerve block to be performed.  The closer the procedure is to the spine, the more serious the risks are.  Great care is taken when placing the radio frequency needles, block needles or lesioning probes, but sometimes complications can occur.  Infection: Any time  there is an injection through the skin, there is a risk of infection.  This is why sterile conditions are used for these blocks.  There are four possible types of infection.  Localized skin infection.  Central Nervous System Infection-This can be in the form of Meningitis, which can be deadly.  Epidural Infections-This can be in the form of an epidural abscess, which can cause pressure inside of the spine, causing compression of the spinal cord with subsequent paralysis. This would require an emergency surgery to decompress, and there are no guarantees that the patient would recover from the paralysis.  Discitis-This is an infection of the intervertebral discs.  It occurs in about 1% of discography procedures.  It is difficult to treat and it may lead to surgery.        2. Pain: the needles have to go through skin and soft tissues, will cause soreness.       3. Damage to internal structures:  The nerves to be lesioned may be near blood vessels or    other nerves which can be potentially damaged.       4. Bleeding: Bleeding is more common if the patient is taking blood thinners such as  aspirin, Coumadin, Ticiid, Plavix, etc., or if he/she have some genetic predisposition  such as hemophilia. Bleeding into the spinal canal can cause compression of the spinal  cord with subsequent paralysis.  This would require an emergency surgery to  decompress and there are no guarantees that the patient would recover from the  paralysis.       5. Pneumothorax:  Puncturing of a lung is a possibility, every time a needle is introduced in  the area of the chest or upper back.  Pneumothorax refers to free air around the  collapsed lung(s), inside of the thoracic cavity (chest cavity).  Another two possible  complications related to a similar event would include: Hemothorax and Chylothorax.   These are variations of the Pneumothorax, where instead of air around the collapsed  lung(s), you may have blood or chyle,  respectively.       6. Spinal headaches: They may occur with any procedures in the area of the spine.       7. Persistent CSF (Cerebro-Spinal Fluid) leakage: This is a rare problem, but may occur  with prolonged intrathecal or epidural catheters either due to the formation of a fistulous  track or a dural tear.       8. Nerve damage: By working so close to the spinal cord, there is always a possibility of  nerve damage, which could be as serious as a permanent spinal cord injury with  paralysis.       9. Death:  Although rare, severe deadly allergic reactions known as "Anaphylactic  reaction" can occur to any of the medications used.      10. Worsening of the symptoms:  We can always make thing worse.  What are the chances of something like this happening? Chances of any of this occuring are extremely low.  By statistics, you have more of a chance of getting killed in a motor vehicle accident: while driving to the hospital than any of the above occurring .    Nevertheless, you should be aware that they are possibilities.  In general, it is similar to taking a shower.  Everybody knows that you can slip, hit your head and get killed.  Does that mean that you should not shower again?  Nevertheless always keep in mind that statistics do not mean anything if you happen to be on the wrong side of them.  Even if a procedure has a 1 (one) in a 1,000,000 (million) chance of going wrong, it you happen to be that one..Also, keep in mind that by statistics, you have more of a chance of having something go wrong when taking medications.  Who should not have this procedure? If you are on a blood thinning medication (e.g. Coumadin, Plavix, see list of "Blood Thinners"), or if you have an active infection going on, you should not have the procedure.  If you are taking any blood thinners, please inform your physician.  How should I prepare for this procedure?  Do not eat or drink anything at least six hours prior to the  procedure.  Bring a driver with you .  It cannot be a taxi.  Come accompanied by an adult that can drive you back, and that is strong enough to help you if your legs get weak or numb from the local anesthetic.  Take all of your medicines the morning of the procedure with just enough water to swallow them.  If you have diabetes, make sure that you are scheduled to have your procedure done first thing in the morning, whenever possible.  If you have diabetes, take only half of your insulin dose and notify our nurse that you have done so as soon as you arrive at the clinic.  If you are diabetic, but only take blood sugar pills (oral hypoglycemic), then do not take them on the morning of your procedure.  You may take them after you have had the procedure.  Do not take aspirin or any aspirin-containing medications, at least eleven (11) days prior to the procedure.  They may prolong bleeding.  Wear loose fitting clothing that may be easy to take off and that you would not mind if it got stained with Betadine or blood.  Do not wear any jewelry or perfume  Remove any nail coloring.  It will interfere with some of our monitoring equipment.  NOTE: Remember that this is not meant to be interpreted as a complete list of all possible complications.  Unforeseen problems may occur.  BLOOD THINNERS The following drugs contain aspirin or other products, which can cause increased bleeding during surgery and should not be taken for 2 weeks prior to and 1 week after surgery.  If you should need take something for relief of minor pain, you may take acetaminophen which is found in Tylenol,m Datril, Anacin-3 and Panadol. It is not blood thinner. The products listed below are.  Do not take any of the products listed below in addition to any listed on your instruction sheet.  A.P.C or A.P.C with Codeine Codeine Phosphate Capsules #3 Ibuprofen Ridaura  ABC compound Congesprin Imuran rimadil  Advil Cope Indocin  Robaxisal  Alka-Seltzer Effervescent Pain Reliever and Antacid Coricidin or Coricidin-D  Indomethacin Rufen  Alka-Seltzer plus Cold Medicine Cosprin Ketoprofen S-A-C Tablets  Anacin Analgesic Tablets or Capsules Coumadin Korlgesic Salflex  Anacin Extra Strength Analgesic tablets or capsules CP-2 Tablets Lanoril Salicylate  Anaprox Cuprimine Capsules Levenox Salocol  Anexsia-D Dalteparin Magan Salsalate  Anodynos Darvon compound Magnesium Salicylate Sine-off  Ansaid Dasin Capsules Magsal Sodium Salicylate  Anturane Depen Capsules Marnal Soma  APF Arthritis pain formula Dewitt's Pills Measurin Stanback  Argesic Dia-Gesic Meclofenamic Sulfinpyrazone  Arthritis Bayer Timed Release Aspirin Diclofenac Meclomen Sulindac  Arthritis pain formula Anacin Dicumarol Medipren Supac  Analgesic (Safety coated) Arthralgen Diffunasal Mefanamic Suprofen  Arthritis Strength Bufferin Dihydrocodeine Mepro Compound Suprol  Arthropan liquid Dopirydamole Methcarbomol with Aspirin Synalgos  ASA tablets/Enseals Disalcid Micrainin Tagament  Ascriptin Doan's Midol Talwin  Ascriptin A/D Dolene Mobidin Tanderil  Ascriptin Extra Strength Dolobid Moblgesic Ticlid  Ascriptin with Codeine Doloprin or Doloprin with Codeine Momentum Tolectin  Asperbuf Duoprin Mono-gesic Trendar  Aspergum Duradyne Motrin or Motrin IB Triminicin  Aspirin plain, buffered or enteric coated Durasal Myochrisine Trigesic  Aspirin Suppositories Easprin Nalfon Trillsate  Aspirin with Codeine Ecotrin Regular or Extra Strength Naprosyn Uracel  Atromid-S Efficin Naproxen Ursinus  Auranofin Capsules Elmiron Neocylate Vanquish  Axotal Emagrin Norgesic Verin  Azathioprine Empirin or Empirin with Codeine Normiflo Vitamin E  Azolid Emprazil Nuprin Voltaren  Bayer Aspirin plain, buffered or children's or timed BC Tablets or powders Encaprin Orgaran Warfarin Sodium  Buff-a-Comp Enoxaparin Orudis Zorpin  Buff-a-Comp with Codeine Equegesic Os-Cal-Gesic    Buffaprin Excedrin plain, buffered or Extra Strength Oxalid   Bufferin Arthritis Strength Feldene Oxphenbutazone   Bufferin plain or Extra Strength Feldene Capsules Oxycodone with Aspirin   Bufferin with Codeine Fenoprofen Fenoprofen Pabalate or Pabalate-SF   Buffets II Flogesic Panagesic   Buffinol plain or Extra Strength Florinal or Florinal with Codeine Panwarfarin   Buf-Tabs Flurbiprofen Penicillamine   Butalbital Compound Four-way cold tablets Penicillin   Butazolidin Fragmin Pepto-Bismol   Carbenicillin Geminisyn Percodan   Carna Arthritis Reliever Geopen Persantine   Carprofen Gold's salt Persistin   Chloramphenicol Goody's Phenylbutazone   Chloromycetin Haltrain Piroxlcam   Clmetidine heparin Plaquenil   Cllnoril Hyco-pap Ponstel   Clofibrate Hydroxy chloroquine Propoxyphen         Before stopping any of these medications, be sure to consult the physician who ordered them.  Some, such as Coumadin (Warfarin) are ordered to prevent or treat serious conditions such as "deep thrombosis", "pumonary embolisms", and other heart problems.  The amount of time that you may need off of the medication may also vary with the medication and the reason for which you were taking it.  If you are taking any of these medications, please make sure you notify your pain physician before you undergo any procedures.          

## 2015-05-07 NOTE — Progress Notes (Signed)
Patient's Name: Alice Simmons MRN: [id number] DOB: 1955/10/15 DOS: 05/07/2015  Primary Reason(s) for Visit: Post-Procedure evaluation CC: Back Pain and Foot Pain   HPI:   Alice Simmons is a 59 y.o. year old, female patient, who returns today as an established patient. She has Hypertension goal BP (blood pressure) < 140/90; Anxiety; Hypothyroidism; Apnea, sleep; Obesity; Depression; Arthritis; Tobacco abuse; Screening for breast cancer; Migraine headache; Chronic insomnia; Breast pain in female; Bipolar disorder, current episode depressed, severe, with psychotic features ([place]); Self-inflicted injury; Clinical depression; Diabetes mellitus type 2, controlled, without complications ([place]); Mass of left breast on mammogram; Bilateral tinnitus; Back pain, thoracic; Hyperlipidemia LDL goal <100; Vitamin D deficiency; Primary meningioma of optic nerve sheath ([place]); Bronchospasm; Marijuana use; Continuous illicit drug use; Chronic low back pain; Chronic pain syndrome; Lumbar facet syndrome (Bilateral) (R>L); Low back pain; History of migraine headaches; Lumbar radicular pain; Lumbosacral radiculopathy; Right leg pain; Lumbar spondylosis; Acute low back pain; Nicotine dependence; History of psychiatric disorder; Fibromyalgia; Generalized Paresthesias; Paresthesias with subjective weakness; Chronic pain; Sensitivity to sunlight; History of photophobia; Marijuana abuse, continuous; Neurogenic pain; Neuropathic pain; Musculoskeletal pain; Diffuse myofascial pain syndrome; and Allodynia on her problem list.. Her primarily concern today is the Back Pain and Foot Pain     The patient returns today after her third diagnostic bilateral lumbar facet block under fluoroscopic guidance. The patient attained excellent relief with the third one as well therefore we'll plan on moving on to the radiofrequency. She continues to ask me about sensations that she has been experiencing, and I am convinced that they have to do with  some degree of cannabinoid poisoning. I have to give the patient the benefit of the doubt that this is what may be going on since alternatively, he can be more of a psychiatric issue. She still has problems with her husband, which she says she may divorce. Of course, she will never save this in front of him. The patient's last UDS done on 12/10/2013 tested positive for "ecstasy". (3,4-Methylenedioxymethamphetamine (MDMA), commonly known as ecstasy (E), is a psychoactive drug used primarily as a recreational drug.)   Today's Pain Score: 9 . Clinically she looks like a 4-5/10. Clear symptom exaggeration. Reported level of pain is incompatible with clinical observations. Pain Type: Chronic pain Pain Location: Back Pain Frequency: Constant  Date of Last Visit: 04/25/15    Pharmacotherapy Review: The patient's medication management is currently not under our care.  UDS Results:  Lab Results  Component Value Date   THCU NEGATIVE 12/10/2013   PCPSCRNUR NEGATIVE 12/10/2013   MDMA POSITIVE 12/10/2013   AMPHETMU NEGATIVE 12/10/2013   METHADONE NEGATIVE 12/10/2013   ETOH < 3 12/10/2013    UDS Interpretation: The test the patient tested positive for "ecstasy". Substance Use Disorder (SUD) Risk Level: Very high. Pharmacologic Plan: We will prescribe controlled substances to this patient.  Last Available Lab Work: No visits with results within 3 Month(s) from this visit. Latest known visit with results is:  Telephone on 11/28/2014  Component Date Value Ref Range Status  . WBC 11/30/2014 9.4  3.4 - 10.8 x10E3/uL Final  . RBC 11/30/2014 4.36  3.77 - 5.28 x10E6/uL Final  . Hemoglobin 11/30/2014 13.2  11.1 - 15.9 g/dL Final  . Hematocrit 11/30/2014 39.8  34.0 - 46.6 % Final  . MCV 11/30/2014 91  79 - 97 fL Final  . MCH 11/30/2014 30.3  26.6 - 33.0 pg Final  . MCHC 11/30/2014 33.2  31.5 - 35.7 g/dL Final  .  RDW 11/30/2014 14.0  12.3 - 15.4 % Final  . Platelets 11/30/2014 368  150 - 379  x10E3/uL Final  . Neutrophils 11/30/2014 74   Final  . Lymphs 11/30/2014 15   Final  . Monocytes 11/30/2014 9   Final  . Eos 11/30/2014 2   Final  . Basos 11/30/2014 0   Final  . Neutrophils Absolute 11/30/2014 7.0  1.4 - 7.0 x10E3/uL Final  . Lymphocytes Absolute 11/30/2014 1.4  0.7 - 3.1 x10E3/uL Final  . Monocytes Absolute 11/30/2014 0.9  0.1 - 0.9 x10E3/uL Final  . EOS (ABSOLUTE) 11/30/2014 0.1  0.0 - 0.4 x10E3/uL Final  . Basophils Absolute 11/30/2014 0.0  0.0 - 0.2 x10E3/uL Final  . Immature Granulocytes 11/30/2014 0   Final  . Immature Grans (Abs) 11/30/2014 0.0  0.0 - 0.1 x10E3/uL Final  . Glucose 11/30/2014 98  65 - 99 mg/dL Final  . BUN 11/30/2014 11  6 - 24 mg/dL Final  . Creatinine, Ser 11/30/2014 0.87  0.57 - 1.00 mg/dL Final  . GFR calc non Af Amer 11/30/2014 75  >59 mL/min/1.73 Final  . GFR calc Af Amer 11/30/2014 86  >59 mL/min/1.73 Final  . BUN/Creatinine Ratio 11/30/2014 13  9 - 23 Final  . Sodium 11/30/2014 142  134 - 144 mmol/L Final  . Potassium 11/30/2014 4.1  3.5 - 5.2 mmol/L Final  . Chloride 11/30/2014 104  97 - 108 mmol/L Final  . CO2 11/30/2014 21  18 - 29 mmol/L Final  . Calcium 11/30/2014 9.2  8.7 - 10.2 mg/dL Final  . Total Protein 11/30/2014 6.8  6.0 - 8.5 g/dL Final  . Albumin 11/30/2014 4.4  3.5 - 5.5 g/dL Final  . Globulin, Total 11/30/2014 2.4  1.5 - 4.5 g/dL Final  . Albumin/Globulin Ratio 11/30/2014 1.8  1.1 - 2.5 Final  . Bilirubin Total 11/30/2014 0.2  0.0 - 1.2 mg/dL Final  . Alkaline Phosphatase 11/30/2014 92  39 - 117 IU/L Final  . AST 11/30/2014 42* 0 - 40 IU/L Final  . ALT 11/30/2014 45* 0 - 32 IU/L Final  . Cholesterol, Total 11/30/2014 237* 100 - 199 mg/dL Final  . Triglycerides 11/30/2014 83  0 - 149 mg/dL Final  . HDL 11/30/2014 38* >39 mg/dL Final  . VLDL Cholesterol Cal 11/30/2014 17  5 - 40 mg/dL Final  . LDL Calculated 11/30/2014 182* 0 - 99 mg/dL Final  . Chol/HDL Ratio 11/30/2014 6.2* 0.0 - 4.4 ratio units Final  . TSH  11/30/2014 1.140  0.450 - 4.500 uIU/mL Final  . Vit D, 25-Hydroxy 11/30/2014 22.2* 30.0 - 100.0 ng/mL Final  . Hgb A1c MFr Bld 11/30/2014 6.0* 4.8 - 5.6 % Final  . Est. average glucose Bld gHb Est-m* 11/30/2014 126   Final   Procedure Assessment:  Procedure done on last visit: Diagnostic bilateral lumbar facet block #3 under fluoroscopic guidance and IV sedation. Side-effects or Adverse reactions: None reported. Sedation: Procedure was performed with sedation.  Results: Ultra-Short Term Relief (First 1 hour after procedure): 100 % Short Term Relief (Initial 4-6 hrs after procedure): 100 % Long Term Relief : 10 %  Current Relief (Now):  10% Interpretation of Results: Ultra-short term relief is a normal physiological response to analgesics and anesthetics provided during the procedure. Short-term relief confirms injected site as etiology of pain. No long term benefit would suggest etiology to be non-inflammatory, possibly compressive.  Based on these results, we will move on to radiofrequency ablation of the lumbar facet joints. She  indicates that the right side is worse therefore we'll start with that one.  Allergies: Alice Simmons is allergic to aspirin and penicillins.  Meds: The patient has a current medication list which includes the following prescription(s): amlodipine, gabapentin, labetalol, losartan, paroxetine, topiramate, and zolpidem. Requested Prescriptions    No prescriptions requested or ordered in this encounter    ROS: Constitutional: Afebrile, no chills, well hydrated and well nourished Gastrointestinal: negative Musculoskeletal:negative Neurological: negative Behavioral/Psych: negative  PFSH: Medical:  Alice Simmons  has a past medical history of Hypertension; Anxiety; Obesity (BMI 30.0-34.9); Hypertension, benign; Depression, controlled; and Pain of both breasts. Family: family history includes Breast cancer in her paternal aunt; Diabetes in her father and  mother; Heart disease in her brother and mother; Hyperlipidemia in her brother, brother, brother, brother, and sister; Hypertension in her father and mother. Surgical:  has past surgical history that includes Abdominal hysterectomy; Carpal tunnel release; Breast surgery; Breast biopsy (Bilateral, 1980); Breast biopsy (Right, 1985); and Eye surgery. Tobacco:  reports that she has been smoking Cigarettes.  She has a 18 pack-year smoking history. She does not have any smokeless tobacco history on file. Alcohol:  reports that she does not drink alcohol. Drug:  reports that she uses illicit drugs.  Physical Exam: Vitals:  Today's Vitals   05/07/15 1134 05/07/15 1135  BP: 117/70   Pulse: 65   Temp: 98 F (36.7 C)   TempSrc: Oral   Resp: 18   Height: 5\' 2"  (1.575 m)   SpO2: 100%   PainSc:  9   Calculated BMI: There is no weight on file to calculate BMI. General appearance: appears stated age, mild distress, mildly obese and Very nervous Eyes: conjunctivae/corneas clear. PERRL, EOM's intact. Fundi benign. Lungs: No evidence respiratory distress, no audible rales or ronchi and no use of accessory muscles of respiration Neck: no adenopathy, no carotid bruit, no JVD, supple, symmetrical, trachea midline and thyroid not enlarged, symmetric, no tenderness/mass/nodules Back: Positive pain on bilateral hyperextension and rotation, highly suggestive of facet disease. Extremities: extremities normal, atraumatic, no cyanosis or edema Pulses: 2+ and symmetric Skin: Skin color, texture, turgor normal. No rashes or lesions Neurologic: Grossly normal    Assessment: Encounter Diagnosis:  Primary Diagnosis: Facet syndrome, lumbar [M54.5]  Plan: Shateara was seen today for back pain and foot pain.  Diagnoses and all orders for this visit:  Lumbar facet syndrome (Bilateral) (R>L) -     RADIOFREQUENCY, CERVICAL THORACIC LUMBER, GENICULAR ; Future  Neurogenic pain  Neuropathic pain  Musculoskeletal  pain  Diffuse myofascial pain syndrome  Allodynia  Chronic low back pain  Chronic pain  Lumbar spondylosis -     RADIOFREQUENCY, CERVICAL THORACIC LUMBER, GENICULAR ; Future  Continuous illicit drug use  Tobacco abuse  Dependence on nicotine from other tobacco product  Marijuana use  Marijuana abuse, continuous  Vitamin D deficiency  Bipolar disorder, current episode depressed, severe, with psychotic features [facility])     Patient Instructions   Radiofrequency Lesioning Radiofrequency lesioning is a procedure that is performed to relieve pain. The procedure is often used for back, neck, or arm pain. Radiofrequency lesioning involves the use of a machine that creates radio waves to make heat. During the procedure, the heat is applied to the nerve that carries the pain signal. The heat damages the nerve and interferes with the pain signal. Pain relief usually lasts for 6 months to 1 year. LET [facility] CARE PROVIDER KNOW ABOUT:  Any allergies you have.  All medicines  you are taking, including vitamins, herbs, eye drops, creams, and over-the-counter medicines.  Previous problems you or members of your family have had with the use of anesthetics.  Any blood disorders you have.  Previous surgeries you have had.  Any medical conditions you have.  Whether you are pregnant or may be pregnant. RISKS AND COMPLICATIONS Generally, this is a safe procedure. However, problems may occur, including:  Pain or soreness at the injection site.  Infection at the injection site.  Damage to nerves or blood vessels. BEFORE THE PROCEDURE  Ask your health care provider about:  Changing or stopping your regular medicines. This is especially important if you are taking diabetes medicines or blood thinners.  Taking medicines such as aspirin and ibuprofen. These medicines can thin your blood. Do not take these medicines before your procedure if your health care provider instructs you  not to.  Follow instructions from your health care provider about eating or drinking restrictions.  Plan to have someone take you home after the procedure.  If you go home right after the procedure, plan to have someone with you for 24 hours. PROCEDURE  You will be given one or more of the following:  A medicine to help you relax (sedative).  A medicine to numb the area (local anesthetic).  You will be awake during the procedure. You will need to be able to talk with the health care provider during the procedure.  With the help of a type of X-ray (fluoroscopy), the health care provider will insert a radiofrequency needle into the area to be treated.  Next, a wire that carries the radio waves (electrode) will be put through the radiofrequency needle. An electrical pulse will be sent through the electrode to verify the correct nerve. You will feel a tingling sensation, and you may have muscle twitching.  Then, the tissue that is around the needle tip will be heated by an electric current that is passed using the radiofrequency machine. This will numb the nerves.  A bandage (dressing) will be put on the insertion area after the procedure is done. The procedure may vary among health care providers and hospitals. AFTER THE PROCEDURE  Your blood pressure, heart rate, breathing rate, and blood oxygen level will be monitored often until the medicines you were given have worn off.  Return to your normal activities as directed by your health care provider.   This information is not intended to replace advice given to you by your health care provider. Make sure you discuss any questions you have with your health care provider.   Document Released: 01/28/2011 Document Revised: 02/20/2015 Document Reviewed: 07/09/2014 Elsevier Interactive Patient Education 2016 [place]  What are the risk, side effects and possible complications? Generally speaking, most  procedures are safe.  However, with any procedure there are risks, side effects, and the possibility of complications.  The risks and complications are dependent upon the sites that are lesioned, or the type of nerve block to be performed.  The closer the procedure is to the spine, the more serious the risks are.  Great care is taken when placing the radio frequency needles, block needles or lesioning probes, but sometimes complications can occur.  Infection: Any time there is an injection through the skin, there is a risk of infection.  This is why sterile conditions are used for these blocks.  There are four possible types of infection.  Localized skin infection.  Central Nervous System Infection-This can  be in the form of Meningitis, which can be deadly.  Epidural Infections-This can be in the form of an epidural abscess, which can cause pressure inside of the spine, causing compression of the spinal cord with subsequent paralysis. This would require an emergency surgery to decompress, and there are no guarantees that the patient would recover from the paralysis.  Discitis-This is an infection of the intervertebral discs.  It occurs in about 1% of discography procedures.  It is difficult to treat and it may lead to surgery.        2. Pain: the needles have to go through skin and soft tissues, will cause soreness.       3. Damage to internal structures:  The nerves to be lesioned may be near blood vessels or    other nerves which can be potentially damaged.       4. Bleeding: Bleeding is more common if the patient is taking blood thinners such as  aspirin, Coumadin, Ticiid, Plavix, etc., or if he/she have some genetic predisposition  such as hemophilia. Bleeding into the spinal canal can cause compression of the spinal  cord with subsequent paralysis.  This would require an emergency surgery to  decompress and there are no guarantees that the patient would recover from the  paralysis.        5. Pneumothorax:  Puncturing of a lung is a possibility, every time a needle is introduced in  the area of the chest or upper back.  Pneumothorax refers to free air around the  collapsed lung(s), inside of the thoracic cavity (chest cavity).  Another two possible  complications related to a similar event would include: Hemothorax and Chylothorax.   These are variations of the Pneumothorax, where instead of air around the collapsed  lung(s), you may have blood or chyle, respectively.       6. Spinal headaches: They may occur with any procedures in the area of the spine.       7. Persistent CSF (Cerebro-Spinal Fluid) leakage: This is a rare problem, but may occur  with prolonged intrathecal or epidural catheters either due to the formation of a fistulous  track or a dural tear.       8. Nerve damage: By working so close to the spinal cord, there is always a possibility of  nerve damage, which could be as serious as a permanent spinal cord injury with  paralysis.       9. Death:  Although rare, severe deadly allergic reactions known as "Anaphylactic  reaction" can occur to any of the medications used.      10. Worsening of the symptoms:  We can always make thing worse.  What are the chances of something like this happening? Chances of any of this occuring are extremely low.  By statistics, you have more of a chance of getting killed in a motor vehicle accident: while driving to the hospital than any of the above occurring .  Nevertheless, you should be aware that they are possibilities.  In general, it is similar to taking a shower.  Everybody knows that you can slip, hit your head and get killed.  Does that mean that you should not shower again?  Nevertheless always keep in mind that statistics do not mean anything if you happen to be on the wrong side of them.  Even if a procedure has a 1 (one) in a 1,000,000 (million) chance of going wrong, it you happen to be that one..Also, keep  in mind that by statistics,  you have more of a chance of having something go wrong when taking medications.  Who should not have this procedure? If you are on a blood thinning medication (e.g. Coumadin, Plavix, see list of "Blood Thinners"), or if you have an active infection going on, you should not have the procedure.  If you are taking any blood thinners, please inform your physician.  How should I prepare for this procedure?  Do not eat or drink anything at least six hours prior to the procedure.  Bring a driver with you .  It cannot be a taxi.  Come accompanied by an adult that can drive you back, and that is strong enough to help you if your legs get weak or numb from the local anesthetic.  Take all of your medicines the morning of the procedure with just enough water to swallow them.  If you have diabetes, make sure that you are scheduled to have your procedure done first thing in the morning, whenever possible.  If you have diabetes, take only half of your insulin dose and notify our nurse that you have done so as soon as you arrive at the clinic.  If you are diabetic, but only take blood sugar pills (oral hypoglycemic), then do not take them on the morning of your procedure.  You may take them after you have had the procedure.  Do not take aspirin or any aspirin-containing medications, at least eleven (11) days prior to the procedure.  They may prolong bleeding.  Wear loose fitting clothing that may be easy to take off and that you would not mind if it got stained with Betadine or blood.  Do not wear any jewelry or perfume  Remove any nail coloring.  It will interfere with some of our monitoring equipment.  NOTE: Remember that this is not meant to be interpreted as a complete list of all possible complications.  Unforeseen problems may occur.  BLOOD THINNERS The following drugs contain aspirin or other products, which can cause increased bleeding during surgery and should not be taken for 2 weeks prior  to and 1 week after surgery.  If you should need take something for relief of minor pain, you may take acetaminophen which is found in Tylenol,m Datril, Anacin-3 and Panadol. It is not blood thinner. The products listed below are.  Do not take any of the products listed below in addition to any listed on your instruction sheet.  A.P.C or A.P.C with Codeine Codeine Phosphate Capsules #3 Ibuprofen Ridaura  ABC compound Congesprin Imuran rimadil  Advil Cope Indocin Robaxisal  Alka-Seltzer Effervescent Pain Reliever and Antacid Coricidin or Coricidin-D  Indomethacin Rufen  Alka-Seltzer plus Cold Medicine Cosprin Ketoprofen S-A-C Tablets  Anacin Analgesic Tablets or Capsules Coumadin Korlgesic Salflex  Anacin Extra Strength Analgesic tablets or capsules CP-2 Tablets Lanoril Salicylate  Anaprox Cuprimine Capsules Levenox Salocol  Anexsia-D Dalteparin Magan Salsalate  Anodynos Darvon compound Magnesium Salicylate Sine-off  Ansaid Dasin Capsules Magsal Sodium Salicylate  Anturane Depen Capsules Marnal Soma  APF Arthritis pain formula Dewitt's Pills Measurin Stanback  Argesic Dia-Gesic Meclofenamic Sulfinpyrazone  Arthritis Bayer Timed Release Aspirin Diclofenac Meclomen Sulindac  Arthritis pain formula Anacin Dicumarol Medipren Supac  Analgesic (Safety coated) Arthralgen Diffunasal Mefanamic Suprofen  Arthritis Strength Bufferin Dihydrocodeine Mepro Compound Suprol  Arthropan liquid Dopirydamole Methcarbomol with Aspirin Synalgos  ASA tablets/Enseals Disalcid Micrainin Tagament  Ascriptin Doan's Midol Talwin  Ascriptin A/D Dolene Mobidin Tanderil  Ascriptin Extra Strength Dolobid Moblgesic  Ticlid  Ascriptin with Codeine Doloprin or Doloprin with Codeine Momentum Tolectin  Asperbuf Duoprin Mono-gesic Trendar  Aspergum Duradyne Motrin or Motrin IB Triminicin  Aspirin plain, buffered or enteric coated Durasal Myochrisine Trigesic  Aspirin Suppositories Easprin Nalfon Trillsate  Aspirin with  Codeine Ecotrin Regular or Extra Strength Naprosyn Uracel  Atromid-S Efficin Naproxen Ursinus  Auranofin Capsules Elmiron Neocylate Vanquish  Axotal Emagrin Norgesic Verin  Azathioprine Empirin or Empirin with Codeine Normiflo Vitamin E  Azolid Emprazil Nuprin Voltaren  Bayer Aspirin plain, buffered or children's or timed BC Tablets or powders Encaprin Orgaran Warfarin Sodium  Buff-a-Comp Enoxaparin Orudis Zorpin  Buff-a-Comp with Codeine Equegesic Os-Cal-Gesic   Buffaprin Excedrin plain, buffered or Extra Strength Oxalid   Bufferin Arthritis Strength Feldene Oxphenbutazone   Bufferin plain or Extra Strength Feldene Capsules Oxycodone with Aspirin   Bufferin with Codeine Fenoprofen Fenoprofen Pabalate or Pabalate-SF   Buffets II Flogesic Panagesic   Buffinol plain or Extra Strength Florinal or Florinal with Codeine Panwarfarin   Buf-Tabs Flurbiprofen Penicillamine   Butalbital Compound Four-way cold tablets Penicillin   Butazolidin Fragmin Pepto-Bismol   Carbenicillin Geminisyn Percodan   Carna Arthritis Reliever Geopen Persantine   Carprofen Gold's salt Persistin   Chloramphenicol Goody's Phenylbutazone   Chloromycetin Haltrain Piroxlcam   Clmetidine heparin Plaquenil   Cllnoril Hyco-pap Ponstel   Clofibrate Hydroxy chloroquine Propoxyphen         Before stopping any of these medications, be sure to consult the physician who ordered them.  Some, such as Coumadin (Warfarin) are ordered to prevent or treat serious conditions such as "deep thrombosis", "pumonary embolisms", and other heart problems.  The amount of time that you may need off of the medication may also vary with the medication and the reason for which you were taking it.  If you are taking any of these medications, please make sure you notify your pain physician before you undergo any procedures.            Medications discontinued today:  Medications Discontinued During This Encounter  Medication Reason  .  fentaNYL (SUBLIMAZE) injection 100 mcg   . lidocaine (PF) (XYLOCAINE) 1 % injection 10 mL   . midazolam (VERSED) 5 MG/5ML injection 5 mg   . ropivacaine (PF) 2 mg/ml (0.2%) (NAROPIN) epidural 9 mL   . triamcinolone acetonide (KENALOG-40) injection 40 mg    Medications administered today:  Alice Simmons had no medications administered during this visit.  Primary Care Physician: Bobetta Lime, MD Location: [facility] Note by: Kathlen Brunswick. Dossie Arbour, M.D, DABA, DABAPM, DABPM, DABIPP, FIPP

## 2015-05-07 NOTE — Progress Notes (Signed)
Safety precautions to be maintained throughout the outpatient stay will include: orient to surroundings, keep bed in low position, maintain call bell within reach at all times, provide assistance with transfer out of bed and ambulation.  Pts husband refused inter. And states he can translate- Stated to husband that i can not speak spanish and she cant understand english well and that I need an inter.- Alice Simmons at bedside

## 2015-05-30 ENCOUNTER — Encounter: Payer: Self-pay | Admitting: Family Medicine

## 2015-05-30 ENCOUNTER — Ambulatory Visit (INDEPENDENT_AMBULATORY_CARE_PROVIDER_SITE_OTHER): Payer: Medicare Other | Admitting: Family Medicine

## 2015-05-30 VITALS — BP 106/82 | HR 72 | Temp 99.1°F | Resp 18 | Ht 66.0 in | Wt 135.0 lb

## 2015-05-30 DIAGNOSIS — J069 Acute upper respiratory infection, unspecified: Secondary | ICD-10-CM

## 2015-05-30 DIAGNOSIS — J40 Bronchitis, not specified as acute or chronic: Secondary | ICD-10-CM

## 2015-05-30 MED ORDER — AZITHROMYCIN 250 MG PO TABS: ORAL_TABLET | ORAL | Status: DC

## 2015-05-30 MED ORDER — PREDNISONE 20 MG PO TABS: 20.0000 mg | ORAL_TABLET | Freq: Every day | ORAL | Status: DC

## 2015-05-30 MED ORDER — HYDROCOD POLST-CPM POLST ER 10-8 MG/5ML PO SUER: 5.0000 mL | Freq: Two times a day (BID) | ORAL | Status: DC | PRN

## 2015-05-30 NOTE — Progress Notes (Signed)
Name: Alice Simmons   MRN: [id number]    DOB: 1956/02/06   Date:05/30/2015       Progress Note  Subjective  Chief Complaint  Chief Complaint  Patient presents with  . URI    onset 12 days, syptoms include: cough, congestion, runny nose, sneezing, sore throat, cant sleep a night.    HPI  URI/Bronchitis: she has been sick for almost one week.  She developed nasal congestion, rhinorrhea, sneezing, sore throat and chest and productive cough that is keeping her up at night. She smokes daily. She states she does not have a diagnoses of COPD. Denies SOB or chest pain. She has chills and low grade fever at home, but has been taking Tylenol and otc Coricidin HBP without significant improvement of symptoms.   Past Surgical History  Procedure Laterality Date  . Cholecystectomy  2014  . Abdominal hysterectomy  2010  . Bilateral carpal tunnel release      Family History  Problem Relation Age of Onset  . Diabetes Mother   . Hypertension Mother     Social History   Social History  . Marital Status: Married    Spouse Name: N/A  . Number of Children: N/A  . Years of Education: N/A   Occupational History  . Not on file.   Social History Main Topics  . Smoking status: Current Every Day Smoker -- 1.00 packs/day for 10 years    Types: Cigarettes  . Smokeless tobacco: Never Used  . Alcohol Use: No  . Drug Use: No  . Sexual Activity: Not Currently   Other Topics Concern  . Not on file   Social History Narrative  . No narrative on file     Current outpatient prescriptions:  .  azithromycin (ZITHROMAX Z-PAK) 250 MG tablet, Take as directed, Disp: 6 each, Rfl: 0 .  chlorpheniramine-HYDROcodone (TUSSIONEX PENNKINETIC ER) 10-8 MG/5ML SUER, Take 5 mLs by mouth every 12 (twelve) hours as needed for cough., Disp: 140 mL, Rfl: 0 .  predniSONE (DELTASONE) 20 MG tablet, Take 1 tablet (20 mg total) by mouth daily with breakfast., Disp: 10 tablet, Rfl: 0  Allergies  Allergen Reactions   . Aspirin   . Penicillins      ROS  Ten systems reviewed and is negative except as mentioned in HPI  She also has back pain and neuropathy and has tingling all over her body - going to pain clinic  Objective  Filed Vitals:   05/30/15 1525  BP: 106/82  Pulse: 72  Temp: 99.1 F (37.3 C)  TempSrc: Oral  Resp: 18  Height: 5\' 6"  (1.676 m)  Weight: 135 lb (61.236 kg)  SpO2: 98%    Body mass index is 21.8 kg/(m^2).  Physical Exam  Constitutional: Patient appears well-developed and well-nourished.  No distress.  HEENT: head atraumatic, normocephalic, pupils equal and reactive to light,  neck supple, throat within normal limits Cardiovascular: Normal rate, regular rhythm and normal heart sounds.  No murmur heard. No BLE edema. Pulmonary/Chest: Effort normal and breath sounds normal. No respiratory distress. Abdominal: Soft.  There is no tenderness. Psychiatric: Patient has a normal mood and affect. behavior is normal. Judgment and thought content normal.  PHQ2/9: Depression screen PHQ 2/9 05/30/2015  Decreased Interest 0  Down, Depressed, Hopeless 0  PHQ - 2 Score 0     Fall Risk: Fall Risk  05/30/2015  Falls in the past year? No     Functional Status Survey: Is the patient deaf or have  difficulty hearing?: No Does the patient have difficulty seeing, even when wearing glasses/contacts?: No Does the patient have difficulty concentrating, remembering, or making decisions?: No Does the patient have difficulty walking or climbing stairs?: No Does the patient have difficulty dressing or bathing?: No Does the patient have difficulty doing errands alone such as visiting a doctor's office or shopping?: No   Assessment & Plan  1. Bronchitis  - predniSONE (DELTASONE) 20 MG tablet; Take 1 tablet (20 mg total) by mouth daily with breakfast.  Dispense: 10 tablet; Refill: 0 - chlorpheniramine-HYDROcodone (TUSSIONEX PENNKINETIC ER) 10-8 MG/5ML SUER; Take 5 mLs by mouth every  12 (twelve) hours as needed for cough.  Dispense: 140 mL; Refill: 0 - azithromycin (ZITHROMAX Z-PAK) 250 MG tablet; Take as directed  Dispense: 6 each; Refill: 0  2. Upper respiratory infection  Advised to continue otc medication

## 2015-05-31 ENCOUNTER — Other Ambulatory Visit: Payer: Self-pay | Admitting: Family Medicine

## 2015-05-31 DIAGNOSIS — R05 Cough: Secondary | ICD-10-CM

## 2015-05-31 DIAGNOSIS — R059 Cough, unspecified: Secondary | ICD-10-CM

## 2015-05-31 MED ORDER — GUAIFENESIN-CODEINE 100-10 MG/5ML PO SYRP: 5.0000 mL | ORAL_SOLUTION | Freq: Three times a day (TID) | ORAL | Status: DC | PRN

## 2015-06-03 ENCOUNTER — Other Ambulatory Visit: Payer: Self-pay | Admitting: Family Medicine

## 2015-06-03 DIAGNOSIS — H02834 Dermatochalasis of left upper eyelid: Secondary | ICD-10-CM | POA: Diagnosis not present

## 2015-06-03 DIAGNOSIS — H02413 Mechanical ptosis of bilateral eyelids: Secondary | ICD-10-CM | POA: Diagnosis not present

## 2015-06-03 DIAGNOSIS — H02831 Dermatochalasis of right upper eyelid: Secondary | ICD-10-CM | POA: Diagnosis not present

## 2015-06-04 ENCOUNTER — Ambulatory Visit: Payer: Medicare Other | Admitting: Family Medicine

## 2015-06-05 ENCOUNTER — Encounter: Payer: Self-pay | Admitting: Family Medicine

## 2015-06-05 ENCOUNTER — Ambulatory Visit (INDEPENDENT_AMBULATORY_CARE_PROVIDER_SITE_OTHER): Payer: Self-pay | Admitting: Family Medicine

## 2015-06-05 VITALS — BP 112/70 | HR 73 | Temp 97.9°F | Resp 16 | Wt 141.5 lb

## 2015-06-05 DIAGNOSIS — N6320 Unspecified lump in the left breast, unspecified quadrant: Secondary | ICD-10-CM

## 2015-06-05 DIAGNOSIS — N644 Mastodynia: Secondary | ICD-10-CM | POA: Insufficient documentation

## 2015-06-05 DIAGNOSIS — E785 Hyperlipidemia, unspecified: Secondary | ICD-10-CM | POA: Diagnosis not present

## 2015-06-05 DIAGNOSIS — E1165 Type 2 diabetes mellitus with hyperglycemia: Secondary | ICD-10-CM | POA: Diagnosis not present

## 2015-06-05 DIAGNOSIS — F418 Other specified anxiety disorders: Secondary | ICD-10-CM

## 2015-06-05 DIAGNOSIS — I10 Essential (primary) hypertension: Secondary | ICD-10-CM | POA: Diagnosis not present

## 2015-06-05 DIAGNOSIS — F3341 Major depressive disorder, recurrent, in partial remission: Secondary | ICD-10-CM | POA: Insufficient documentation

## 2015-06-05 DIAGNOSIS — Z1231 Encounter for screening mammogram for malignant neoplasm of breast: Secondary | ICD-10-CM | POA: Diagnosis not present

## 2015-06-05 DIAGNOSIS — G4700 Insomnia, unspecified: Secondary | ICD-10-CM

## 2015-06-05 DIAGNOSIS — N63 Unspecified lump in breast: Secondary | ICD-10-CM

## 2015-06-05 LAB — POCT UA - MICROALBUMIN: MICROALBUMIN (UR) POC: 50 mg/L

## 2015-06-05 MED ORDER — CLONAZEPAM 0.5 MG PO TABS: 0.5000 mg | ORAL_TABLET | Freq: Two times a day (BID) | ORAL | Status: DC | PRN

## 2015-06-05 MED ORDER — ZOLPIDEM TARTRATE 10 MG PO TABS: 10.0000 mg | ORAL_TABLET | Freq: Every evening | ORAL | Status: DC | PRN

## 2015-06-05 NOTE — Progress Notes (Signed)
Name: Alice Simmons   MRN: [id number]    DOB: [birth year]   Date:06/05/2015       Progress Note  Subjective  Chief Complaint  Chief Complaint  Patient presents with  . Labs Only  . Referral    mammogram   . Medication Refill  . Panic Attack    patient's husband is asking for something to help her with panic attacks    HPI  Alice Simmons is a 59 year old female with a past history of Depression and Anxiety. Was on many mood stabilizers in the past but per patient's choice she did not want to continue all of the medications except Paxil 30 mg po bid. Recently having panic attacks where she feels electric sensations all over body, overwhelmed, like she is dying. Symptoms occur occasionally, maybe once a week.   Going to pain clinic for her neurological symptoms. Getting injections, not helping much.  Would like order for mammogram.   Insomnia well controled on Ambien 10 mg po qhs prn. Needs refill.  Patient is here for routine follow up of Diabetes Type II.  Current diabetes medication regimen includes diet modification, weight loss, lifestyle changes. Overall the patient feels that their blood glucose is well controlled. Not checking blood glucose. Was on oral agent prior to establishing care with me.   Has recently had eye lid surgery for hooded eyelids.    Past Medical History  Diagnosis Date  . Diabetes mellitus without complication ([place])   . Obesity   . Hyperlipidemia   . Hypertension   . Insomnia   . Breast pain   . Depression   . 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 Acute antritis 06/05/2015  . Back pain, thoracic 06/05/2015  . Clinical depression 06/05/2015  . Type 2 diabetes mellitus with hyperglycemia ([place]) 06/05/2015  . HLD (hyperlipidemia) 06/05/2015  . Benign hypertension 06/05/2015  . Cannot sleep 06/05/2015  . Vitamin D deficiency 06/05/2015  . LBP (low back pain) 06/05/2015  . Adiposity  06/05/2015  . Breast pain 06/05/2015  . 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 Bilateral tinnitus 06/05/2015    Social History  Substance Use Topics  . Smoking status: Current Every Day Smoker -- 1.00 packs/day for 10 years    Types: Cigarettes  . Smokeless tobacco: Never Used  . Alcohol Use: No     Current outpatient prescriptions:  .  amLODipine (NORVASC) 10 MG tablet, Take by mouth., Disp: , Rfl:  .  labetalol (NORMODYNE) 300 MG tablet, Take by mouth., Disp: , Rfl:  .  losartan (COZAAR) 50 MG tablet, Take by mouth., Disp: , Rfl:  .  topiramate (TOPAMAX) 100 MG tablet, Take by mouth., Disp: , Rfl:  .  azithromycin (ZITHROMAX Z-PAK) 250 MG tablet, Take as directed, Disp: 6 each, Rfl: 0 .  chlorpheniramine-HYDROcodone (TUSSIONEX PENNKINETIC ER) 10-8 MG/5ML SUER, Take 5 mLs by mouth every 12 (twelve) hours as needed for cough., Disp: 140 mL, Rfl: 0 .  gabapentin (NEURONTIN) 300 MG capsule, TK 1 C PO Q NIGHT AT BEDTIME. MAY. INCREASE TO 2 XD PRN, Disp: , Rfl: 5 .  neomycin-polymyxin b-dexamethasone (MAXITROL) 3.5-10000-0.1 OINT, APPLY TO SUTURES BID FOR 3 DAYS THEN DISCONTINUE AND USE VASELINE, Disp: , Rfl: 0 .  PARoxetine (PAXIL) 30 MG tablet, TK 1 T PO BID, Disp: , Rfl: 5 .  predniSONE (DELTASONE) 20 MG tablet, Take 1 tablet (20 mg  total) by mouth daily with breakfast., Disp: 10 tablet, Rfl: 0 .  zolpidem (AMBIEN) 10 MG tablet, TK 1 T PO HS, Disp: , Rfl: 5  Past Surgical History  Procedure Laterality Date  . Cholecystectomy  2014  . Abdominal hysterectomy  2010  . Bilateral carpal tunnel release      Family History  Problem Relation Age of Onset  . Diabetes Mother   . Hypertension Mother     Allergies  Allergen Reactions  . Aspirin   . Penicillins      Review of Systems  CONSTITUTIONAL: No significant weight changes, fever, chills, weakness or fatigue.  HEENT:  - Eyes: No visual changes.  - Ears: No auditory changes. No pain.  - Nose: No  sneezing, congestion, runny nose. - Throat: No sore throat. No changes in swallowing. SKIN: No rash or itching.  CARDIOVASCULAR: No chest pain, chest pressure or chest discomfort. No palpitations or edema.  RESPIRATORY: No shortness of breath, cough or sputum.  NEUROLOGICAL: No headache, dizziness, syncope, paralysis, ataxia. Intermittent numbness or tingling in the extremities. No memory changes. No change in bowel or bladder control.  MUSCULOSKELETAL: No joint pain. No muscle pain. HEMATOLOGIC: No anemia, bleeding or bruising.  LYMPHATICS: No enlarged lymph nodes.  PSYCHIATRIC: Yes change in mood. No change in sleep pattern.  ENDOCRINOLOGIC: Yes reports of sweating, cold or heat intolerance. No polyuria or polydipsia.     Objective  BP 112/70 mmHg  Pulse 73  Temp(Src) 97.9 F (36.6 C) (Oral)  Resp 16  Wt 141 lb 8 oz (64.184 kg)  SpO2 98%  LMP  (Approximate) Body mass index is 22.85 kg/(m^2).  Physical Exam  Constitutional: Patient appears well-developed and well-nourished. In no distress.  HEENT:  - Head: Normocephalic and atraumatic.  - Ears: Bilateral TMs gray, no erythema or effusion - Nose: Nasal mucosa moist - Mouth/Throat: Oropharynx is clear and moist. No tonsillar hypertrophy or erythema. No post nasal drainage.  - Eyes: Bilateral eye lid surgery with mild swelling with stitches in place. Conjunctivae clear, EOM movements normal. PERRLA. No scleral icterus.  Neck: Normal range of motion. Neck supple. No JVD present. No thyromegaly present.  Cardiovascular: Normal rate, regular rhythm and normal heart sounds.  No murmur heard.  Pulmonary/Chest: Effort normal and breath sounds normal. No respiratory distress. Musculoskeletal: Normal range of motion bilateral UE and LE, no joint effusions. Peripheral vascular: Bilateral LE no edema. Neurological: CN II-XII grossly intact with no focal deficits. Alert and oriented to person, place, and time. Coordination, balance,  strength, speech and gait are normal.  Skin: Skin is warm and dry. No rash noted. No erythema.  Psychiatric: Patient has a agitated mood and affect. Behavior is normal in office today. Judgment and thought content normal in office today.  Diabetic Foot Exam - Simple   Simple Foot Form  Diabetic Foot exam was performed with the following findings:  Yes 06/05/2015  9:04 AM  Visual Inspection  No deformities, no ulcerations, no other skin breakdown bilaterally:  Yes  Sensation Testing  Intact to touch and monofilament testing bilaterally:  Yes  Pulse Check  Posterior Tibialis and Dorsalis pulse intact bilaterally:  Yes  Comments      Assessment & Plan  1. Hypertension goal BP (blood pressure) < 140/90 Stable on current medication dose.  - CBC with Differential/Platelet - Comprehensive metabolic panel - Hemoglobin A1c - Lipid panel - TSH  2. HLD (hyperlipidemia) Due to have cholesterol rechecked. Declined statin therapy before.  -  CBC with Differential/Platelet - Comprehensive metabolic panel - Hemoglobin A1c - Lipid panel - TSH  3. Type 2 diabetes mellitus with hyperglycemia, without long-term current use of insulin ([facility]) Not on medication. Lifestyle controled. Due for Hba1c.  - CBC with Differential/Platelet - Comprehensive metabolic panel - Hemoglobin A1c - Lipid panel - TSH - POCT UA - Microalbumin  4. Major depressive disorder, recurrent episode, in partial remission with anxious distress ([place]) I have expressed that she may greatly benefit from behavior therapy, she will consider referal for near future. Continue Paxil dose, add on prn use of clonazepam. Previously on alprazolam but patient self discontinued due to habit forming propensity.  - clonazePAM (KLONOPIN) 0.5 MG tablet; Take 1 tablet (0.5 mg total) by mouth 2 (two) times daily as needed for anxiety.  Dispense: 30 tablet; Refill: 5  5. Insomnia Refilled.  - zolpidem (AMBIEN) 10 MG tablet; Take 1 tablet  (10 mg total) by mouth at bedtime as needed for sleep.  Dispense: 30 tablet; Refill: 5  6. Left breast mass Due for further imaging.  - MM Digital Diagnostic Unilat L; Future - US BREAST LTD UNI LEFT INC AXILLA; Future  7. Encounter for screening mammogram for malignant neoplasm of breast  - MM Digital Screening; Future

## 2015-06-06 LAB — COMPREHENSIVE METABOLIC PANEL
ALK PHOS: 79 IU/L (ref 39–117)
ALT: 39 IU/L — AB (ref 0–32)
AST: 20 IU/L (ref 0–40)
Albumin/Globulin Ratio: 1.9 (ref 1.1–2.5)
Albumin: 4.2 g/dL (ref 3.5–5.5)
BUN / CREAT RATIO: 20 (ref 9–23)
BUN: 18 mg/dL (ref 6–24)
Bilirubin Total: 0.2 mg/dL (ref 0.0–1.2)
CO2: 23 mmol/L (ref 18–29)
CREATININE: 0.88 mg/dL (ref 0.57–1.00)
Calcium: 9.6 mg/dL (ref 8.7–10.2)
Chloride: 106 mmol/L (ref 96–106)
GFR calc non Af Amer: 73 mL/min/{1.73_m2} (ref >59)
GFR, EST AFRICAN AMERICAN: 84 mL/min/{1.73_m2} (ref >59)
GLUCOSE: 98 mg/dL (ref 65–99)
Globulin, Total: 2.2 g/dL (ref 1.5–4.5)
Potassium: 4.4 mmol/L (ref 3.5–5.2)
Sodium: 143 mmol/L (ref 134–144)
Total Protein: 6.4 g/dL (ref 6.0–8.5)

## 2015-06-06 LAB — CBC WITH DIFFERENTIAL/PLATELET
BASOS ABS: 0 10*3/uL (ref 0.0–0.2)
Basos: 0 %
EOS (ABSOLUTE): 0.1 10*3/uL (ref 0.0–0.4)
Eos: 1 %
Hematocrit: 42.6 % (ref 34.0–46.6)
Hemoglobin: 14.3 g/dL (ref 11.1–15.9)
IMMATURE GRANULOCYTES: 0 %
Immature Grans (Abs): 0 10*3/uL (ref 0.0–0.1)
LYMPHS ABS: 3.5 10*3/uL — AB (ref 0.7–3.1)
Lymphs: 31 %
MCH: 30.6 pg (ref 26.6–33.0)
MCHC: 33.6 g/dL (ref 31.5–35.7)
MCV: 91 fL (ref 79–97)
MONOCYTES: 7 %
MONOS ABS: 0.7 10*3/uL (ref 0.1–0.9)
NEUTROS PCT: 61 %
Neutrophils Absolute: 6.7 10*3/uL (ref 1.4–7.0)
Platelets: 456 10*3/uL — ABNORMAL HIGH (ref 150–379)
RBC: 4.68 x10E6/uL (ref 3.77–5.28)
RDW: 14.6 % (ref 12.3–15.4)
WBC: 11.1 10*3/uL — AB (ref 3.4–10.8)

## 2015-06-06 LAB — HEMOGLOBIN A1C
Est. average glucose Bld gHb Est-mCnc: 120 mg/dL
Hgb A1c MFr Bld: 5.8 % — ABNORMAL HIGH (ref 4.8–5.6)

## 2015-06-06 LAB — LIPID PANEL
CHOL/HDL RATIO: 5.4 ratio — AB (ref 0.0–4.4)
Cholesterol, Total: 252 mg/dL — ABNORMAL HIGH (ref 100–199)
HDL: 47 mg/dL (ref >39)
LDL CALC: 180 mg/dL — AB (ref 0–99)
TRIGLYCERIDES: 124 mg/dL (ref 0–149)
VLDL Cholesterol Cal: 25 mg/dL (ref 5–40)

## 2015-06-06 LAB — TSH: TSH: 3.33 u[IU]/mL (ref 0.450–4.500)

## 2015-06-20 ENCOUNTER — Encounter: Payer: Self-pay | Admitting: Pain Medicine

## 2015-06-20 ENCOUNTER — Other Ambulatory Visit: Payer: Self-pay

## 2015-06-20 ENCOUNTER — Other Ambulatory Visit: Payer: Self-pay | Admitting: Family Medicine

## 2015-06-20 DIAGNOSIS — Z1231 Encounter for screening mammogram for malignant neoplasm of breast: Secondary | ICD-10-CM

## 2015-06-20 DIAGNOSIS — N6320 Unspecified lump in the left breast, unspecified quadrant: Secondary | ICD-10-CM

## 2015-06-20 NOTE — Telephone Encounter (Signed)
Opened wrong encounter

## 2015-07-08 ENCOUNTER — Ambulatory Visit
Admission: RE | Admit: 2015-07-08 | Discharge: 2015-07-08 | Disposition: A | Discharge status: Home / Self Care | Payer: Medicare Other | Source: Ambulatory Visit | Attending: Family Medicine | Admitting: Family Medicine

## 2015-07-08 ENCOUNTER — Other Ambulatory Visit: Payer: Self-pay | Admitting: Family Medicine

## 2015-07-08 DIAGNOSIS — N63 Unspecified lump in breast: Secondary | ICD-10-CM | POA: Insufficient documentation

## 2015-07-08 DIAGNOSIS — N6320 Unspecified lump in the left breast, unspecified quadrant: Secondary | ICD-10-CM

## 2015-07-08 DIAGNOSIS — Z1231 Encounter for screening mammogram for malignant neoplasm of breast: Secondary | ICD-10-CM

## 2015-07-08 DIAGNOSIS — Q859 Phakomatosis, unspecified: Secondary | ICD-10-CM | POA: Insufficient documentation

## 2015-07-18 ENCOUNTER — Encounter: Payer: Self-pay | Admitting: Pain Medicine

## 2015-07-18 ENCOUNTER — Ambulatory Visit: Payer: Medicare Other | Attending: Pain Medicine | Admitting: Pain Medicine

## 2015-07-18 VITALS — BP 110/62 | HR 60 | Temp 98.2°F | Resp 18 | Ht 60.0 in | Wt 137.0 lb

## 2015-07-18 DIAGNOSIS — R202 Paresthesia of skin: Secondary | ICD-10-CM | POA: Diagnosis not present

## 2015-07-18 DIAGNOSIS — F5104 Psychophysiologic insomnia: Secondary | ICD-10-CM | POA: Diagnosis not present

## 2015-07-18 DIAGNOSIS — I10 Essential (primary) hypertension: Secondary | ICD-10-CM | POA: Diagnosis not present

## 2015-07-18 DIAGNOSIS — M545 Low back pain: Secondary | ICD-10-CM | POA: Diagnosis not present

## 2015-07-18 DIAGNOSIS — E119 Type 2 diabetes mellitus without complications: Secondary | ICD-10-CM | POA: Insufficient documentation

## 2015-07-18 DIAGNOSIS — R531 Weakness: Secondary | ICD-10-CM | POA: Diagnosis not present

## 2015-07-18 DIAGNOSIS — M4726 Other spondylosis with radiculopathy, lumbar region: Secondary | ICD-10-CM

## 2015-07-18 DIAGNOSIS — F1210 Cannabis abuse, uncomplicated: Secondary | ICD-10-CM | POA: Insufficient documentation

## 2015-07-18 DIAGNOSIS — F3341 Major depressive disorder, recurrent, in partial remission: Secondary | ICD-10-CM | POA: Insufficient documentation

## 2015-07-18 DIAGNOSIS — M79604 Pain in right leg: Secondary | ICD-10-CM | POA: Insufficient documentation

## 2015-07-18 DIAGNOSIS — H53149 Visual discomfort, unspecified: Secondary | ICD-10-CM | POA: Insufficient documentation

## 2015-07-18 DIAGNOSIS — M549 Dorsalgia, unspecified: Secondary | ICD-10-CM | POA: Diagnosis present

## 2015-07-18 DIAGNOSIS — G43909 Migraine, unspecified, not intractable, without status migrainosus: Secondary | ICD-10-CM | POA: Insufficient documentation

## 2015-07-18 DIAGNOSIS — G8929 Other chronic pain: Secondary | ICD-10-CM | POA: Diagnosis not present

## 2015-07-18 DIAGNOSIS — F419 Anxiety disorder, unspecified: Secondary | ICD-10-CM | POA: Diagnosis not present

## 2015-07-18 DIAGNOSIS — F17200 Nicotine dependence, unspecified, uncomplicated: Secondary | ICD-10-CM | POA: Insufficient documentation

## 2015-07-18 DIAGNOSIS — G4730 Sleep apnea, unspecified: Secondary | ICD-10-CM | POA: Insufficient documentation

## 2015-07-18 DIAGNOSIS — M5417 Radiculopathy, lumbosacral region: Secondary | ICD-10-CM | POA: Diagnosis not present

## 2015-07-18 DIAGNOSIS — F319 Bipolar disorder, unspecified: Secondary | ICD-10-CM | POA: Insufficient documentation

## 2015-07-18 DIAGNOSIS — E785 Hyperlipidemia, unspecified: Secondary | ICD-10-CM | POA: Insufficient documentation

## 2015-07-18 DIAGNOSIS — M47816 Spondylosis without myelopathy or radiculopathy, lumbar region: Secondary | ICD-10-CM | POA: Diagnosis not present

## 2015-07-18 DIAGNOSIS — M797 Fibromyalgia: Secondary | ICD-10-CM | POA: Diagnosis not present

## 2015-07-18 DIAGNOSIS — E039 Hypothyroidism, unspecified: Secondary | ICD-10-CM | POA: Diagnosis not present

## 2015-07-18 MED ORDER — FENTANYL CITRATE (PF) 100 MCG/2ML IJ SOLN
100.0000 ug | Freq: Once | INTRAMUSCULAR | Status: AC
  Administered 2015-07-18: 50 ug via INTRAVENOUS

## 2015-07-18 MED ORDER — HYDROCODONE-ACETAMINOPHEN 5-325 MG PO TABS: 1.0000 | ORAL_TABLET | Freq: Four times a day (QID) | ORAL | Status: DC | PRN

## 2015-07-18 MED ORDER — LIDOCAINE HCL (PF) 1 % IJ SOLN: 10.0000 mL | Freq: Once | INTRAMUSCULAR | Status: DC

## 2015-07-18 MED ORDER — TRIAMCINOLONE ACETONIDE 40 MG/ML IJ SUSP
INTRAMUSCULAR | Status: AC
  Administered 2015-07-18: 40 mg
  Filled 2015-07-18: qty 1

## 2015-07-18 MED ORDER — MIDAZOLAM HCL 5 MG/5ML IJ SOLN
5.0000 mg | Freq: Once | INTRAMUSCULAR | Status: AC
  Administered 2015-07-18: 1 mg via INTRAVENOUS

## 2015-07-18 MED ORDER — ROPIVACAINE HCL 2 MG/ML IJ SOLN
INTRAMUSCULAR | Status: AC
  Administered 2015-07-18: 9 mL
  Filled 2015-07-18: qty 10

## 2015-07-18 MED ORDER — MIDAZOLAM HCL 5 MG/5ML IJ SOLN
INTRAMUSCULAR | Status: AC
  Administered 2015-07-18: 1 mg via INTRAVENOUS
  Filled 2015-07-18: qty 5

## 2015-07-18 MED ORDER — TRIAMCINOLONE ACETONIDE 40 MG/ML IJ SUSP
40.0000 mg | Freq: Once | INTRAMUSCULAR | Status: AC
  Administered 2015-07-18: 40 mg

## 2015-07-18 MED ORDER — FENTANYL CITRATE (PF) 100 MCG/2ML IJ SOLN
INTRAMUSCULAR | Status: AC
  Administered 2015-07-18: 50 ug via INTRAVENOUS
  Filled 2015-07-18: qty 2

## 2015-07-18 MED ORDER — LACTATED RINGERS IV SOLN: 1000.0000 mL | INTRAVENOUS | Status: AC

## 2015-07-18 MED ORDER — ROPIVACAINE HCL 2 MG/ML IJ SOLN
9.0000 mL | Freq: Once | INTRAMUSCULAR | Status: AC
  Administered 2015-07-18: 9 mL

## 2015-07-18 NOTE — Patient Instructions (Addendum)
Smoking Cessation, Tips for Success If you are ready to quit smoking, congratulations! You have chosen to help yourself be healthier. Cigarettes bring nicotine, tar, carbon monoxide, and other irritants into your body. Your lungs, heart, and blood vessels will be able to work better without these poisons. There are many different ways to quit smoking. Nicotine gum, nicotine patches, a nicotine inhaler, or nicotine nasal spray can help with physical craving. Hypnosis, support groups, and medicines help break the habit of smoking. WHAT THINGS CAN I DO TO MAKE QUITTING EASIER?  Here are some tips to help you quit for good:  Pick a date when you will quit smoking completely. Tell all of your friends and family about your plan to quit on that date.  Do not try to slowly cut down on the number of cigarettes you are smoking. Pick a quit date and quit smoking completely starting on that day.  Throw away all cigarettes.   Clean and remove all ashtrays from your home, work, and car.  On a card, write down your reasons for quitting. Carry the card with you and read it when you get the urge to smoke.  Cleanse your body of nicotine. Drink enough water and fluids to keep your urine clear or pale yellow. Do this after quitting to flush the nicotine from your body.  Learn to predict your moods. Do not let a bad situation be your excuse to have a cigarette. Some situations in your life might tempt you into wanting a cigarette.  Never have "just one" cigarette. It leads to wanting another and another. Remind yourself of your decision to quit.  Change habits associated with smoking. If you smoked while driving or when feeling stressed, try other activities to replace smoking. Stand up when drinking your coffee. Brush your teeth after eating. Sit in a different chair when you read the paper. Avoid alcohol while trying to quit, and try to drink fewer caffeinated beverages. Alcohol and caffeine may urge you to  smoke.  Avoid foods and drinks that can trigger a desire to smoke, such as sugary or spicy foods and alcohol.  Ask people who smoke not to smoke around you.  Have something planned to do right after eating or having a cup of coffee. For example, plan to take a walk or exercise.  Try a relaxation exercise to calm you down and decrease your stress. Remember, you may be tense and nervous for the first 2 weeks after you quit, but this will pass.  Find new activities to keep your hands busy. Play with a pen, coin, or rubber band. Doodle or draw things on paper.  Brush your teeth right after eating. This will help cut down on the craving for the taste of tobacco after meals. You can also try mouthwash.   Use oral substitutes in place of cigarettes. Try using lemon drops, carrots, cinnamon sticks, or chewing gum. Keep them handy so they are available when you have the urge to smoke.  When you have the urge to smoke, try deep breathing.  Designate your home as a nonsmoking area.  If you are a heavy smoker, ask your health care provider about a prescription for nicotine chewing gum. It can ease your withdrawal from nicotine.  Reward yourself. Set aside the cigarette money you save and buy yourself something nice.  Look for support from others. Join a support group or smoking cessation program. Ask someone at home or at work to help you with your plan   to quit smoking.  Always ask yourself, "Do I need this cigarette or is this just a reflex?" Tell yourself, "Today, I choose not to smoke," or "I do not want to smoke." You are reminding yourself of your decision to quit.  Do not replace cigarette smoking with electronic cigarettes (commonly called e-cigarettes). The safety of e-cigarettes is unknown, and some may contain harmful chemicals.  If you relapse, do not give up! Plan ahead and think about what you will do the next time you get the urge to smoke. HOW WILL I FEEL WHEN I QUIT SMOKING? You  may have symptoms of withdrawal because your body is used to nicotine (the addictive substance in cigarettes). You may crave cigarettes, be irritable, feel very hungry, cough often, get headaches, or have difficulty concentrating. The withdrawal symptoms are only temporary. They are strongest when you first quit but will go away within 10-14 days. When withdrawal symptoms occur, stay in control. Think about your reasons for quitting. Remind yourself that these are signs that your body is healing and getting used to being without cigarettes. Remember that withdrawal symptoms are easier to treat than the major diseases that smoking can cause.  Even after the withdrawal is over, expect periodic urges to smoke. However, these cravings are generally short lived and will go away whether you smoke or not. Do not smoke! WHAT RESOURCES ARE AVAILABLE TO HELP ME QUIT SMOKING? Your health care provider can direct you to community resources or hospitals for support, which may include:  Group support.  Education.  Hypnosis.  Therapy.   This information is not intended to replace advice given to you by your health care provider. Make sure you discuss any questions you have with your health care provider.   Document Released: 02/28/2004 Document Revised: 06/22/2014 Document Reviewed: 11/17/2012 Elsevier Interactive Patient Education 2016 [employer]. Radiofrequency Lesioning Radiofrequency lesioning is a procedure that is performed to relieve pain. The procedure is often used for back, neck, or arm pain. Radiofrequency lesioning involves the use of a machine that creates radio waves to make heat. During the procedure, the heat is applied to the nerve that carries the pain signal. The heat damages the nerve and interferes with the pain signal. Pain relief usually lasts for 6 months to 1 year. LET [facility] CARE PROVIDER KNOW ABOUT:  Any allergies you have.  All medicines you are taking, including vitamins,  herbs, eye drops, creams, and over-the-counter medicines.  Previous problems you or members of your family have had with the use of anesthetics.  Any blood disorders you have.  Previous surgeries you have had.  Any medical conditions you have.  Whether you are pregnant or may be pregnant. RISKS AND COMPLICATIONS Generally, this is a safe procedure. However, problems may occur, including:  Pain or soreness at the injection site.  Infection at the injection site.  Damage to nerves or blood vessels. BEFORE THE PROCEDURE  Ask your health care provider about:  Changing or stopping your regular medicines. This is especially important if you are taking diabetes medicines or blood thinners.  Taking medicines such as aspirin and ibuprofen. These medicines can thin your blood. Do not take these medicines before your procedure if your health care provider instructs you not to.  Follow instructions from your health care provider about eating or drinking restrictions.  Plan to have someone take you home after the procedure.  If you go home right after the procedure, plan to have someone with you for  24 hours. PROCEDURE  You will be given one or more of the following:  A medicine to help you relax (sedative).  A medicine to numb the area (local anesthetic).  You will be awake during the procedure. You will need to be able to talk with the health care provider during the procedure.  With the help of a type of X-ray (fluoroscopy), the health care provider will insert a radiofrequency needle into the area to be treated.  Next, a wire that carries the radio waves (electrode) will be put through the radiofrequency needle. An electrical pulse will be sent through the electrode to verify the correct nerve. You will feel a tingling sensation, and you may have muscle twitching.  Then, the tissue that is around the needle tip will be heated by an electric current that is passed using the  radiofrequency machine. This will numb the nerves.  A bandage (dressing) will be put on the insertion area after the procedure is done. The procedure may vary among health care providers and hospitals. AFTER THE PROCEDURE  Your blood pressure, heart rate, breathing rate, and blood oxygen level will be monitored often until the medicines you were given have worn off.  Return to your normal activities as directed by your health care provider.   This information is not intended to replace advice given to you by your health care provider. Make sure you discuss any questions you have with your health care provider.   Document Released: 01/28/2011 Document Revised: 02/20/2015 Document Reviewed: 07/09/2014 Elsevier Interactive Patient Education 2016 [employer] Pain Management Discharge Instructions  General Discharge Instructions :  If you need to reach your doctor call: Monday-Friday 8:00 am - 4:00 pm at [phone] or toll free [phone].  After clinic hours [phone] to have operator reach doctor.  Bring all of your medication bottles to all your appointments in the pain clinic.  To cancel or reschedule your appointment with Pain Management please remember to call 24 hours in advance to avoid a fee.  Refer to the educational materials which you have been given on: General Risks, I had my Procedure. Discharge Instructions, Post Sedation.  Post Procedure Instructions:  The drugs you were given will stay in your system until tomorrow, so for the next 24 hours you should not drive, make any legal decisions or drink any alcoholic beverages.  You may eat anything you prefer, but it is better to start with liquids then soups and crackers, and gradually work up to solid foods.  Please notify your doctor immediately if you have any unusual bleeding, trouble breathing or pain that is not related to your normal pain.  Depending on the type of procedure that was done, some parts of  your body may feel week and/or numb.  This usually clears up by tonight or the next day.  Walk with the use of an assistive device or accompanied by an adult for the 24 hours.  You may use ice on the affected area for the first 24 hours.  Put ice in a Ziploc bag and cover with a towel and place against area 15 minutes on 15 minutes off.  You may switch to heat after 24 hours.

## 2015-07-18 NOTE — Progress Notes (Signed)
Safety precautions to be maintained throughout the outpatient stay will include: orient to surroundings, keep bed in low position, maintain call bell within reach at all times, provide assistance with transfer out of bed and ambulation.  
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
## 2015-07-19 ENCOUNTER — Telehealth: Payer: Self-pay | Admitting: *Deleted

## 2015-07-19 NOTE — Telephone Encounter (Signed)
Spoke with man who answered the phone, states she is doing ok.

## 2015-08-14 ENCOUNTER — Encounter: Payer: Self-pay | Admitting: Family Medicine

## 2015-08-14 ENCOUNTER — Ambulatory Visit (INDEPENDENT_AMBULATORY_CARE_PROVIDER_SITE_OTHER): Payer: Medicare Other | Admitting: Family Medicine

## 2015-08-14 VITALS — BP 124/68 | HR 78 | Temp 99.0°F | Resp 14 | Ht 60.0 in | Wt 134.0 lb

## 2015-08-14 DIAGNOSIS — G4700 Insomnia, unspecified: Secondary | ICD-10-CM | POA: Diagnosis not present

## 2015-08-14 DIAGNOSIS — M797 Fibromyalgia: Secondary | ICD-10-CM | POA: Diagnosis not present

## 2015-08-14 DIAGNOSIS — Z9889 Other specified postprocedural states: Secondary | ICD-10-CM | POA: Diagnosis not present

## 2015-08-14 DIAGNOSIS — M7918 Myalgia, other site: Secondary | ICD-10-CM

## 2015-08-14 DIAGNOSIS — D320 Benign neoplasm of cerebral meninges: Secondary | ICD-10-CM | POA: Diagnosis not present

## 2015-08-14 DIAGNOSIS — G43009 Migraine without aura, not intractable, without status migrainosus: Secondary | ICD-10-CM

## 2015-08-14 DIAGNOSIS — F5104 Psychophysiologic insomnia: Secondary | ICD-10-CM

## 2015-08-14 DIAGNOSIS — I10 Essential (primary) hypertension: Secondary | ICD-10-CM | POA: Diagnosis not present

## 2015-08-14 DIAGNOSIS — F315 Bipolar disorder, current episode depressed, severe, with psychotic features: Secondary | ICD-10-CM

## 2015-08-14 DIAGNOSIS — E785 Hyperlipidemia, unspecified: Secondary | ICD-10-CM | POA: Diagnosis not present

## 2015-08-14 MED ORDER — PAROXETINE HCL 30 MG PO TABS: 30.0000 mg | ORAL_TABLET | Freq: Two times a day (BID) | ORAL | Status: DC

## 2015-08-14 MED ORDER — TOPIRAMATE 100 MG PO TABS
200.0000 mg | ORAL_TABLET | Freq: Every day | ORAL | Status: DC
Start: 2015-08-14 — End: 2016-02-25

## 2015-08-14 MED ORDER — PRAVASTATIN SODIUM 20 MG PO TABS: 20.0000 mg | ORAL_TABLET | Freq: Every day | ORAL | Status: DC

## 2015-08-14 MED ORDER — GABAPENTIN 400 MG PO CAPS: 400.0000 mg | ORAL_CAPSULE | Freq: Three times a day (TID) | ORAL | Status: DC

## 2015-08-14 MED ORDER — SUVOREXANT 10 MG PO TABS: 10.0000 mg | ORAL_TABLET | Freq: Every day | ORAL | Status: DC

## 2015-08-14 NOTE — Progress Notes (Signed)
Name: Alice Simmons   MRN: [id number]    DOB: 1956/02/01   Date:08/14/2015       Progress Note  Subjective  Chief Complaint  Chief Complaint  Patient presents with  . Medication Refill  . Hypertension    headaches  . Migraine  . Depression  . Insomnia    HPI  Alice Simmons is a 60 year old female with HTN, HLD, DM II diet controled, significant fluctuating mood disorder (bipolar with tendency for psychotic features esp in the past), sleep disorder, headaches here for routine follow up.   Patient complains of depression and anxiety. She complains of depressed mood, difficulty concentrating, fatigue, insomnia and psychomotor agitation. Onset was approximately several years ago, fluctuating since that time.  She denies current suicidal and homicidal plan or intent.   Family history significant for anxiety, depression and heart disease.Possible organic causes contributing are: medications, endocrine/metabolic, neuro.  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 She complains of the following side effects from the treatment: none.  If you may recall Alice Simmons was on many mood stabilizers in the past (including anti psychotics) but per patient's choice she did not want to continue all of the medications except Paxil 30 mg po bid. Symptoms of panic attacks where she feels electric sensations all over body, overwhelmed, like she is dying previously occurred once a week but now she says she had daily debilitating neuropathic pain. At our last visit I did add on clonopin 0.5mg  po bid prn, #30 provided with 5 refills. The benzo is not doing much for her. Using Gabapentin 300 mg po bid as well.   Going to pain clinic for her neurological symptoms. Getting injections previously which did not help at the time. But it seems she has been started on Norco 5-325 mg every 6 hrs as well. This doesn't help either.  She has been fired from her job as she was  flinching in pain during work and they thought she was on illicit drugs.   Insomnia no longer well controled on Ambien 10 mg po qhs prn. She has tried taking 2 tablets of Ambien 10 mg and this did nothing for her either.   DM II lifestyle controled, last HBA1C 06/05/15 result 5.8%.  She did however have very high cholesterol and they declined Pravastatin 20 mg at bedtime.   [name] has a long history of headaches and is currently on Topiramate 100 taking 2 at bedtime. 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   She has lost over 50 lbs in the past 1 year. She was up to 206lbs on 09/26/13 and is today 134lbs. Now within reasonable BMI.   Also she had eye lid cosmetic surgery and reports still has some residual swelling around her eyes.    Past Medical History  Diagnosis Date  . Anxiety   . Obesity (BMI 30.0-34.9)   . Hypertension, benign   . Depression, controlled   . Pain of both breasts   . Diabetes mellitus without complication ([place])   . Obesity   . Hyperlipidemia   . Hypertension   . Insomnia   . Breast pain   . Depression   . 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 Arthritis 11/15/2012    Patient Active Problem List   Diagnosis Date Noted  . Chronic lower extremity pain (Right) 07/18/2015  . HLD (hyperlipidemia) 06/05/2015  . Hypertension goal BP (blood pressure) <  140/90 06/05/2015  . Bilateral tinnitus 06/05/2015  . Left breast mass 06/05/2015  . Neurogenic pain 05/07/2015  . Musculoskeletal pain 05/07/2015  . Diffuse myofascial pain syndrome 05/07/2015  . Allodynia 05/07/2015  . 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 Marijuana abuse, continuous 04/25/2015  . Nicotine dependence 04/20/2015  . History of psychiatric disorder 04/20/2015  . Fibromyalgia 04/20/2015  . Generalized Paresthesias 04/20/2015  . Paresthesias with subjective weakness 04/20/2015  . Chronic pain 04/20/2015  . Sensitivity to  sunlight 04/20/2015  . History of photophobia 04/20/2015  . Continuous illicit drug use [id number]  . Chronic low back pain (Location of Primary Source of Pain) (Bilateral) (R>L) 03/21/2015  . Chronic pain syndrome 03/21/2015  . Lumbar facet syndrome (Bilateral) (R>L) 03/21/2015  . History of migraine headaches 03/21/2015  . Lumbar radicular pain 03/21/2015  . Lumbosacral radiculopathy 03/21/2015  . Lumbar spondylosis 03/21/2015  . Clinical depression 11/28/2014  . Diabetes mellitus type 2, controlled, without complications ([place]) 99991111  . Mass of left breast on mammogram 11/28/2014  . Back pain, thoracic 11/28/2014  . Hyperlipidemia LDL goal <100 11/28/2014  . Vitamin D deficiency 11/28/2014  . Primary meningioma of optic nerve sheath ([place]) 11/28/2014  . Bipolar disorder, current episode depressed, severe, with psychotic features ([place]) 12/29/2013  . Self-inflicted injury [id number]  . Migraine headache 08/17/2013  . Chronic insomnia 08/17/2013  . Tobacco abuse 05/01/2013  . Apnea, sleep 03/02/2012  . Anxiety 03/04/2011    Social History  Substance Use Topics  . Smoking status: Current Every Day Smoker -- 1.00 packs/day for 10 years    Types: Cigarettes  . Smokeless tobacco: Never Used  . Alcohol Use: No     Comment: occasional     Current outpatient prescriptions:  .  amLODipine (NORVASC) 10 MG tablet, TAKE 1 TABLET BY MOUTH EVERY DAY, Disp: 90 tablet, Rfl: 3 .  clonazePAM (KLONOPIN) 0.5 MG tablet, Take 1 tablet (0.5 mg total) by mouth 2 (two) times daily as needed for anxiety., Disp: 30 tablet, Rfl: 5 .  gabapentin (NEURONTIN) 300 MG capsule, Take 1 capsule (300 mg total) by mouth 2 (two) times daily., Disp: 60 capsule, Rfl: 5 .  HYDROcodone-acetaminophen (NORCO/VICODIN) 5-325 MG tablet, Take 1 tablet by mouth every 6 (six) hours as needed for moderate pain or severe pain (for post-radiofrequency pain.)., Disp: 120 tablet, Rfl: 0 .  labetalol (NORMODYNE) 300 MG tablet,  TAKE 1 TABLET BY MOUTH TWICE DAILY, Disp: 180 tablet, Rfl: 2 .  losartan (COZAAR) 50 MG tablet, TAKE 1 TABLET BY MOUTH DAILY, Disp: 30 tablet, Rfl: 5 .  PARoxetine (PAXIL) 30 MG tablet, TAKE 1 TABLET BY MOUTH TWICE DAILY, Disp: 60 tablet, Rfl: 5 .  topiramate (TOPAMAX) 100 MG tablet, TAKE 1 TABLET BY MOUTH TWICE DAILY (Patient taking differently: TAKE 1 TABLET BY MOUTH TWICE DAILY / patient is taking two tablets at bedtime), Disp: 60 tablet, Rfl: 5 .  zolpidem (AMBIEN) 10 MG tablet, TAKE 1 TABLET BY MOUTH EVERY NIGHT AT BEDTIME (Patient taking differently: 10 mg at bedtime as needed. TAKE 1 TABLET BY MOUTH EVERY NIGHT AT BEDTIME), Disp: 30 tablet, Rfl: 0  Past Surgical History  Procedure Laterality Date  . Abdominal hysterectomy    . Carpal tunnel release    . Breast surgery      benign breast cyst  . Cholecystectomy  2014  . Abdominal hysterectomy  2010  . Bilateral carpal tunnel release    . Breast biopsy Bilateral 1980    EXCISIONAL -  NEG  . Breast biopsy Right 1985    CORE W/OUT CLIP - NEG  . Eye surgery      cataract surger both eyes    Family History  Problem Relation Age of Onset  . Heart disease Mother   . Diabetes Father   . Hypertension Father   . Hyperlipidemia Sister   . Hyperlipidemia Brother   . Heart disease Brother   . Hyperlipidemia Brother   . Hyperlipidemia Brother   . Hyperlipidemia Brother   . Breast cancer Paternal Aunt   . Diabetes Mother   . Hypertension Mother     Allergies  Allergen Reactions  . Aspirin Swelling  . Penicillins Swelling     Review of Systems  CONSTITUTIONAL: Yes significant weight changes. No fever, chills, weakness. Chronic fatigue.  HEENT:  - Eyes: No visual changes.  - Ears: No auditory changes. No pain.  - Nose: No sneezing, congestion, runny nose. - Throat: No sore throat. No changes in swallowing. SKIN: No rash or itching.  CARDIOVASCULAR: No chest pain, chest pressure or chest discomfort. No palpitations or edema.   RESPIRATORY: No shortness of breath, cough or sputum.  GASTROINTESTINAL: No anorexia, nausea, vomiting. No changes in bowel habits. No abdominal pain or blood.  GENITOURINARY: No dysuria. No frequency. No discharge.  NEUROLOGICAL: Yes headaches. No dizziness, syncope, paralysis, ataxia. Yes numbness or tingling in the extremities. No memory changes. No change in bowel or bladder control.  MUSCULOSKELETAL: No joint pain. No muscle pain. Yes burning nerve pain. HEMATOLOGIC: No anemia, bleeding or bruising.  LYMPHATICS: No enlarged lymph nodes.  PSYCHIATRIC: Fluctuations in mood. Yes change in sleep pattern.  ENDOCRINOLOGIC: No reports of sweating, cold or heat intolerance. No polyuria or polydipsia.     Objective  BP 124/68 mmHg  Pulse 78  Temp(Src) 99 F (37.2 C) (Oral)  Resp 14  Ht 5' (1.524 m)  Wt 134 lb (60.782 kg)  BMI 26.17 kg/m2  SpO2 97%  LMP  (Approximate) Body mass index is 26.17 kg/(m^2).  Physical Exam  Constitutional: Patient appears well-developed and well-nourished. In no distress. Notable weight loss. HEENT:  - Head: Normocephalic and atraumatic.  - Ears: Bilateral TMs gray, no erythema or effusion - Nose: Nasal mucosa moist - Mouth/Throat: Oropharynx is clear and moist. No tonsillar hypertrophy or erythema. No post nasal drainage.  - Eyes: Conjunctivae clear, EOM movements normal. PERRLA. No scleral icterus. Eye lid scars healed well with some puffy skin at top and lower eye lids right more than left.  Neck: Normal range of motion. Neck supple. No JVD present. No thyromegaly present.  Cardiovascular: Normal rate, regular rhythm and normal heart sounds.  No murmur heard.  Pulmonary/Chest: Effort normal and breath sounds normal. No respiratory distress. Musculoskeletal: Normal range of motion bilateral UE and LE, no joint effusions. Peripheral vascular: Bilateral LE no edema. Neurological: CN II-XII grossly intact with no focal deficits. Alert and oriented to  person, place, and time. Coordination, balance, strength, speech and gait are normal.  Skin: Skin is warm and dry. No rash noted. No erythema.  Psychiatric: Patient has a agitated mood and affect. Behavior is stable in office today. Judgement poor.   Recent Results (from the past 2160 hour(s))  POCT UA - Microalbumin     Status: Abnormal   Collection Time: 06/05/15  9:31 AM  Result Value Ref Range   Microalbumin Ur, POC 50 mg/L   Creatinine, POC  mg/dL   Albumin/Creatinine Ratio, Urine, POC    CBC  with Differential/Platelet     Status: Abnormal   Collection Time: 06/05/15  9:43 AM  Result Value Ref Range   WBC 11.1 (H) 3.4 - 10.8 x10E3/uL   RBC 4.68 3.77 - 5.28 x10E6/uL   Hemoglobin 14.3 11.1 - 15.9 g/dL   Hematocrit 42.6 34.0 - 46.6 %   MCV 91 79 - 97 fL   MCH 30.6 26.6 - 33.0 pg   MCHC 33.6 31.5 - 35.7 g/dL   RDW 14.6 12.3 - 15.4 %   Platelets 456 (H) 150 - 379 x10E3/uL   Neutrophils 61 %   Lymphs 31 %   Monocytes 7 %   Eos 1 %   Basos 0 %   Neutrophils Absolute 6.7 1.4 - 7.0 x10E3/uL   Lymphocytes Absolute 3.5 (H) 0.7 - 3.1 x10E3/uL   Monocytes Absolute 0.7 0.1 - 0.9 x10E3/uL   EOS (ABSOLUTE) 0.1 0.0 - 0.4 x10E3/uL   Basophils Absolute 0.0 0.0 - 0.2 x10E3/uL   Immature Granulocytes 0 %   Immature Grans (Abs) 0.0 0.0 - 0.1 x10E3/uL  Comprehensive metabolic panel     Status: Abnormal   Collection Time: 06/05/15  9:43 AM  Result Value Ref Range   Glucose 98 65 - 99 mg/dL   BUN 18 6 - 24 mg/dL   Creatinine, Ser 0.88 0.57 - 1.00 mg/dL   GFR calc non Af Amer 73 >59 mL/min/1.73   GFR calc Af Amer 84 >59 mL/min/1.73   BUN/Creatinine Ratio 20 9 - 23   Sodium 143 134 - 144 mmol/L    Comment:               **Please note reference interval change**   Potassium 4.4 3.5 - 5.2 mmol/L   Chloride 106 96 - 106 mmol/L    Comment:               **Please note reference interval change**   CO2 23 18 - 29 mmol/L   Calcium 9.6 8.7 - 10.2 mg/dL   Total Protein 6.4 6.0 - 8.5 g/dL    Albumin 4.2 3.5 - 5.5 g/dL   Globulin, Total 2.2 1.5 - 4.5 g/dL   Albumin/Globulin Ratio 1.9 1.1 - 2.5   Bilirubin Total 0.2 0.0 - 1.2 mg/dL   Alkaline Phosphatase 79 39 - 117 IU/L   AST 20 0 - 40 IU/L   ALT 39 (H) 0 - 32 IU/L  Hemoglobin A1c     Status: Abnormal   Collection Time: 06/05/15  9:43 AM  Result Value Ref Range   Hgb A1c MFr Bld 5.8 (H) 4.8 - 5.6 %    Comment:          Pre-diabetes: 5.7 - 6.4          Diabetes: >6.4          Glycemic control for adults with diabetes: <7.0    Est. average glucose Bld gHb Est-mCnc 120 mg/dL  Lipid panel     Status: Abnormal   Collection Time: 06/05/15  9:43 AM  Result Value Ref Range   Cholesterol, Total 252 (H) 100 - 199 mg/dL   Triglycerides 124 0 - 149 mg/dL   HDL 47 >39 mg/dL   VLDL Cholesterol Cal 25 5 - 40 mg/dL   LDL Calculated 180 (H) 0 - 99 mg/dL   Chol/HDL Ratio 5.4 (H) 0.0 - 4.4 ratio units    Comment:  T. Chol/HDL Ratio                                             Men  Women                               1/2 Avg.Risk  3.4    3.3                                   Avg.Risk  5.0    4.4                                2X Avg.Risk  9.6    7.1                                3X Avg.Risk 23.4   11.0   TSH     Status: None   Collection Time: 06/05/15  9:43 AM  Result Value Ref Range   TSH 3.330 0.450 - 4.500 uIU/mL     Assessment & Plan    1. Bipolar disorder, current episode depressed, severe, with psychotic features ([place]) Jahnaya's neuropathic pain is affecting moods and I have expressed concern to both Leeona and her husband today that this can lead to severe depression, increased risk for suicide or homicide or psychotic symptoms that she has had in the past. I am not comfortable starting her on any new medications give her extensive history and so I strongly encouraged her to consult with a psychiatrist but she outright refused to see one and also declined by suggestion to see a counselor or  therapist.   - PARoxetine (PAXIL) 30 MG tablet; Take 1 tablet (30 mg total) by mouth 2 (two) times daily.  Dispense: 60 tablet; Refill: 5  2. Primary meningioma of optic nerve sheath ([place]) Continue q 6 month imaging and management per specialist recommendations.   3. Hypertension goal BP (blood pressure) < 140/90 Well controled. Should have enough refills previous sent in.  4. Diffuse myofascial pain syndrome Going to pain management. Opioids are likely not going to help her. I had suggested increasing gabapentin dose and frequency which she was agreeable to. I also suggest Cymbalta or Celebrex or Lyrica as some options but again she declined. She says the side effects of all these medications are worse than dealing with what she has going on.  - gabapentin (NEURONTIN) 400 MG capsule; Take 1 capsule (400 mg total) by mouth 3 (three) times daily.  Dispense: 90 capsule; Refill: 5  5. Migraine without aura and without status migrainosus, not intractable Continue current dose. Weight loss likely due to appetite suppression from medication but I have kept oncological process in mind if weight become unhealthy and she has been getting recommended screening measures.  - topiramate (TOPAMAX) 100 MG tablet; Take 2 tablets (200 mg total) by mouth at bedtime.  Dispense: 60 tablet; Refill: 5  6. H/O cosmetic surgery Residual soft tissue fullness, no infection, also exaggerated due to extensive weight loss including face tone. Recommended cosmetic eye cream of choice.   7. Chronic insomnia Trial of belsomra if covered by insurance  in place of ambien 10 mg.  - Suvorexant (BELSOMRA) 10 MG TABS; Take 10 mg by mouth at bedtime.  Dispense: 30 tablet; Refill: 2  8. Hyperlipidemia LDL goal <100 Sent pravastatin again after reviewing lab work done 05/2015. Although she has lost weight, still has genetic propensity for HLD and atherosclerosis.   - pravastatin (PRAVACHOL) 20 MG tablet; Take 1 tablet (20 mg  total) by mouth at bedtime.  Dispense: 30 tablet; Refill: 5

## 2015-08-26 ENCOUNTER — Emergency Department
Admission: EM | Admit: 2015-08-26 | Discharge: 2015-08-27 | Disposition: A | Discharge status: Home / Self Care | Payer: Medicare Other | Attending: Emergency Medicine | Admitting: Emergency Medicine

## 2015-08-26 ENCOUNTER — Encounter: Payer: Self-pay | Admitting: Emergency Medicine

## 2015-08-26 DIAGNOSIS — F17210 Nicotine dependence, cigarettes, uncomplicated: Secondary | ICD-10-CM | POA: Insufficient documentation

## 2015-08-26 DIAGNOSIS — R51 Headache: Secondary | ICD-10-CM | POA: Diagnosis not present

## 2015-08-26 DIAGNOSIS — E119 Type 2 diabetes mellitus without complications: Secondary | ICD-10-CM | POA: Insufficient documentation

## 2015-08-26 DIAGNOSIS — Z79899 Other long term (current) drug therapy: Secondary | ICD-10-CM | POA: Diagnosis not present

## 2015-08-26 DIAGNOSIS — Z880 Allergy status to penicillin: Secondary | ICD-10-CM | POA: Insufficient documentation

## 2015-08-26 DIAGNOSIS — R519 Headache, unspecified: Secondary | ICD-10-CM

## 2015-08-26 DIAGNOSIS — M545 Low back pain, unspecified: Secondary | ICD-10-CM

## 2015-08-26 DIAGNOSIS — G629 Polyneuropathy, unspecified: Secondary | ICD-10-CM | POA: Diagnosis not present

## 2015-08-26 DIAGNOSIS — I10 Essential (primary) hypertension: Secondary | ICD-10-CM | POA: Diagnosis not present

## 2015-08-26 HISTORY — DX: Neoplasm of unspecified behavior of brain: D49.6

## 2015-08-26 HISTORY — DX: Fibromyalgia: M79.7

## 2015-08-26 MED ORDER — SODIUM CHLORIDE 0.9 % IV BOLUS (SEPSIS)
1000.0000 mL | Freq: Once | INTRAVENOUS | Status: AC
  Administered 2015-08-26: 1000 mL via INTRAVENOUS

## 2015-08-26 MED ORDER — KETOROLAC TROMETHAMINE 30 MG/ML IJ SOLN
30.0000 mg | Freq: Once | INTRAMUSCULAR | Status: DC
  Filled 2015-08-26: qty 1

## 2015-08-26 MED ORDER — METOCLOPRAMIDE HCL 5 MG/ML IJ SOLN
10.0000 mg | Freq: Once | INTRAMUSCULAR | Status: AC
  Administered 2015-08-26: 10 mg via INTRAVENOUS
  Filled 2015-08-26: qty 2

## 2015-08-26 MED ORDER — DIPHENHYDRAMINE HCL 50 MG/ML IJ SOLN
25.0000 mg | Freq: Once | INTRAMUSCULAR | Status: AC
  Administered 2015-08-26: 25 mg via INTRAVENOUS
  Filled 2015-08-26: qty 1

## 2015-08-26 NOTE — ED Notes (Signed)
Pt c/o of headache and back pain beginning today at 930pm. Pt unable to sit still due to pain. Pt reports hx of fibromyalgia. Pt c/o "pins, needles, and electricity." Pt reports hx of back pain. Pt c/o headache x 1 month, pt reports hx of tumor behind left eye. Pt alert and oriented x 4, skin warm and dry. Pt reports was prescribed pain medicine but reports does not take them.

## 2015-08-26 NOTE — ED Notes (Signed)
Pt in with co headache and back pain that radiates throughout body.  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

## 2015-08-27 MED ORDER — METHYLPREDNISOLONE SODIUM SUCC 125 MG IJ SOLR
125.0000 mg | Freq: Once | INTRAMUSCULAR | Status: AC
  Administered 2015-08-27: 125 mg via INTRAVENOUS
  Filled 2015-08-27: qty 2

## 2015-08-27 MED ORDER — MAGNESIUM SULFATE 2 GM/50ML IV SOLN
2.0000 g | Freq: Once | INTRAVENOUS | Status: AC
  Administered 2015-08-27: 2 g via INTRAVENOUS
  Filled 2015-08-27: qty 50

## 2015-08-27 NOTE — ED Notes (Signed)

## 2015-08-27 NOTE — Discharge Instructions (Signed)
Back Pain, Adult °Back pain is very common in adults. The cause of back pain is rarely dangerous and the pain often gets better over time. The cause of your back pain may not be known. Some common causes of back pain include: °· Strain of the muscles or ligaments supporting the spine. °· Wear and tear (degeneration) of the spinal disks. °· Arthritis. °· Direct injury to the back. °For many people, back pain may return. Since back pain is rarely dangerous, most people can learn to manage this condition on their own. °HOME CARE INSTRUCTIONS °Watch your back pain for any changes. The following actions may help to lessen any discomfort you are feeling: °· Remain active. It is stressful on your back to sit or stand in one place for long periods of time. Do not sit, drive, or stand in one place for more than 30 minutes at a time. Take short walks on even surfaces as soon as you are able. Try to increase the length of time you walk each day. °· Exercise regularly as directed by your health care provider. Exercise helps your back heal faster. It also helps avoid future injury by keeping your muscles strong and flexible. °· Do not stay in bed. Resting more than 1-2 days can delay your recovery. °· Pay attention to your body when you bend and lift. The most comfortable positions are those that put less stress on your recovering back. Always use proper lifting techniques, including: °· Bending your knees. °· Keeping the load close to your body. °· Avoiding twisting. °· Find a comfortable position to sleep. Use a firm mattress and lie on your side with your knees slightly bent. If you lie on your back, put a pillow under your knees. °· Avoid feeling anxious or stressed. Stress increases muscle tension and can worsen back pain. It is important to recognize when you are anxious or stressed and learn ways to manage it, such as with exercise. °· Take medicines only as directed by your health care provider. Over-the-counter  medicines to reduce pain and inflammation are often the most helpful. Your health care provider may prescribe muscle relaxant drugs. These medicines help dull your pain so you can more quickly return to your normal activities and healthy exercise. °· Apply ice to the injured area: °· Put ice in a plastic bag. °· Place a towel between your skin and the bag. °· Leave the ice on for 20 minutes, 2-3 times a day for the first 2-3 days. After that, ice and heat may be alternated to reduce pain and spasms. °· Maintain a healthy weight. Excess weight puts extra stress on your back and makes it difficult to maintain good posture. °SEEK MEDICAL CARE IF: °· You have pain that is not relieved with rest or medicine. °· You have increasing pain going down into the legs or buttocks. °· You have pain that does not improve in one week. °· You have night pain. °· You lose weight. °· You have a fever or chills. °SEEK IMMEDIATE MEDICAL CARE IF:  °· You develop new bowel or bladder control problems. °· You have unusual weakness or numbness in your arms or legs. °· You develop nausea or vomiting. °· You develop abdominal pain. °· You feel faint. °  °This information is not intended to replace advice given to you by your health care provider. Make sure you discuss any questions you have with your health care provider. °  °Document Released: 06/01/2005 Document Revised: 06/22/2014 Document Reviewed: 10/03/2013 °Elsevier Interactive Patient Education ©2016 [employer]  General Headache Without Cause A headache is pain or discomfort felt around the head or neck area. The specific cause of a headache may not be found. There are many causes and types of headaches. A few common ones are:  Tension headaches.  Migraine headaches.  Cluster headaches.  Chronic daily headaches. HOME CARE INSTRUCTIONS  Watch your condition for any changes. Take these steps to help with your condition: Managing Pain  Take over-the-counter and  prescription medicines only as told by your health care provider.  Lie down in a dark, quiet room when you have a headache.  If directed, apply ice to the head and neck area:  Put ice in a plastic bag.  Place a towel between your skin and the bag.  Leave the ice on for 20 minutes, 2-3 times per day.  Use a heating pad or hot shower to apply heat to the head and neck area as told by your health care provider.  Keep lights dim if bright lights bother you or make your headaches worse. Eating and Drinking  Eat meals on a regular schedule.  Limit alcohol use.  Decrease the amount of caffeine you drink, or stop drinking caffeine. General Instructions  Keep all follow-up visits as told by your health care provider. This is important.  Keep a headache journal to help find out what may trigger your headaches. For example, write down:  What you eat and drink.  How much sleep you get.  Any change to your diet or medicines.  Try massage or other relaxation techniques.  Limit stress.  Sit up straight, and do not tense your muscles.  Do not use tobacco products, including cigarettes, chewing tobacco, or e-cigarettes. If you need help quitting, ask your health care provider.  Exercise regularly as told by your health care provider.  Sleep on a regular schedule. Get 7-9 hours of sleep, or the amount recommended by your health care provider. SEEK MEDICAL CARE IF:   Your symptoms are not helped by medicine.  You have a headache that is different from the usual headache.  You have nausea or you vomit.  You have a fever. SEEK IMMEDIATE MEDICAL CARE IF:   Your headache becomes severe.  You have repeated vomiting.  You have a stiff neck.  You have a loss of vision.  You have problems with speech.  You have pain in the eye or ear.  You have muscular weakness or loss of muscle control.  You lose your balance or have trouble walking.  You feel faint or pass out.  You  have confusion.   This information is not intended to replace advice given to you by your health care provider. Make sure you discuss any questions you have with your health care provider.   Document Released: 06/01/2005 Document Revised: 02/20/2015 Document Reviewed: 09/24/2014 Elsevier Interactive Patient Education 2016 [employer].  Migraine Headache A migraine headache is an intense, throbbing pain on one or both sides of your head. A migraine can last for 30 minutes to several hours. CAUSES  The exact cause of a migraine headache is not always known. However, a migraine may be caused when nerves in the brain become irritated and release chemicals that cause inflammation. This causes pain. Certain things may also trigger migraines, such as:  Alcohol.  Smoking.  Stress.  Menstruation.  Aged cheeses.  Foods or drinks that contain nitrates, glutamate, aspartame, or tyramine.  Lack of sleep.  Chocolate.  Caffeine.  Hunger.  Physical exertion.  Fatigue.  Medicines used to treat chest pain (nitroglycerine), birth control pills, estrogen, and some blood pressure medicines. SIGNS AND SYMPTOMS  Pain on one or both sides of your head.  Pulsating or throbbing pain.  Severe pain that prevents daily activities.  Pain that is aggravated by any physical activity.  Nausea, vomiting, or both.  Dizziness.  Pain with exposure to bright lights, loud noises, or activity.  General sensitivity to bright lights, loud noises, or smells. Before you get a migraine, you may get warning signs that a migraine is coming (aura). An aura may include:  Seeing flashing lights.  Seeing bright spots, halos, or zigzag lines.  Having tunnel vision or blurred vision.  Having feelings of numbness or tingling.  Having trouble talking.  Having muscle weakness. DIAGNOSIS  A migraine headache is often diagnosed based on:  Symptoms.  Physical exam.  A CT scan or MRI of your head.  These imaging tests cannot diagnose migraines, but they can help rule out other causes of headaches. TREATMENT Medicines may be given for pain and nausea. Medicines can also be given to help prevent recurrent migraines.  HOME CARE INSTRUCTIONS  Only take over-the-counter or prescription medicines for pain or discomfort as directed by your health care provider. The use of long-term narcotics is not recommended.  Lie down in a dark, quiet room when you have a migraine.  Keep a journal to find out what may trigger your migraine headaches. For example, write down:  What you eat and drink.  How much sleep you get.  Any change to your diet or medicines.  Limit alcohol consumption.  Quit smoking if you smoke.  Get 7-9 hours of sleep, or as recommended by your health care provider.  Limit stress.  Keep lights dim if bright lights bother you and make your migraines worse. SEEK IMMEDIATE MEDICAL CARE IF:   Your migraine becomes severe.  You have a fever.  You have a stiff neck.  You have vision loss.  You have muscular weakness or loss of muscle control.  You start losing your balance or have trouble walking.  You feel faint or pass out.  You have severe symptoms that are different from your first symptoms. MAKE SURE YOU:   Understand these instructions.  Will watch your condition.  Will get help right away if you are not doing well or get worse.   This information is not intended to replace advice given to you by your health care provider. Make sure you discuss any questions you have with your health care provider.   Document Released: 06/01/2005 Document Revised: 06/22/2014 Document Reviewed: 02/06/2013 Elsevier Interactive Patient Education 2016 [employer]  Pain Without a Known Cause WHAT IS PAIN WITHOUT A KNOWN CAUSE? Pain can occur in any part of the body and can range from mild to severe. Sometimes no cause can be found for why you are having pain. Some types  of pain that can occur without a known cause include:   Headache.  Back pain.  Abdominal pain.  Neck pain. HOW IS PAIN WITHOUT A KNOWN CAUSE DIAGNOSED?  Your health care provider will try to find the cause of your pain. This may include:  Physical exam.  Medical history.  Blood tests.  Urine tests.  X-rays. If no cause is found, your health care provider may diagnose you with pain without a known cause.  IS THERE TREATMENT FOR PAIN WITHOUT A CAUSE?  Treatment depends on the kind of  pain you have. Your health care provider may prescribe medicines to help relieve your pain.  WHAT CAN I DO AT HOME FOR MY PAIN?   Take medicines only as directed by your health care provider.  Stop any activities that cause pain. During periods of severe pain, bed rest may help.  Try to reduce your stress with activities such as yoga or meditation. Talk to your health care provider for other stress-reducing activity recommendations.  Exercise regularly, if approved by your health care provider.  Eat a healthy diet that includes fruits and vegetables. This may improve pain. Talk to your health care provider if you have any questions about your diet. WHAT IF MY PAIN DOES NOT GET BETTER?  If you have a painful condition and no reason can be found for the pain or the pain gets worse, it is important to follow up with your health care provider. It may be necessary to repeat tests and look further for a possible cause.    This information is not intended to replace advice given to you by your health care provider. Make sure you discuss any questions you have with your health care provider.   Document Released: 02/24/2001 Document Revised: 06/22/2014 Document Reviewed: 10/17/2013 Elsevier Interactive Patient Education 2016 [employer] Inc.  Neuropathic Pain Neuropathic pain is pain caused by damage to the nerves that are responsible for certain sensations in your body (sensory nerves). The pain can be  caused by damage to:   The sensory nerves that send signals to your spinal cord and brain (peripheral nervous system).  The sensory nerves in your brain or spinal cord (central nervous system). Neuropathic pain can make you more sensitive to pain. What would be a minor sensation for most people may feel very painful if you have neuropathic pain. This is usually a long-term condition that can be difficult to treat. The type of pain can differ from person to person. It may start suddenly (acute), or it may develop slowly and last for a long time (chronic). Neuropathic pain may come and go as damaged nerves heal or may stay at the same level for years. It often causes emotional distress, loss of sleep, and a lower quality of life. CAUSES  The most common cause of damage to a sensory nerve is diabetes. Many other diseases and conditions can also cause neuropathic pain. Causes of neuropathic pain can be classified as:  Toxic. Many drugs and chemicals can cause toxic damage. The most common cause of toxic neuropathic pain is damage from drug treatment for cancer (chemotherapy).  Metabolic. This type of pain can happen when a disease causes imbalances that damage nerves. Diabetes is the most common of these diseases. Vitamin B deficiency caused by long-term alcohol abuse is another common cause.  Traumatic. Any injury that cuts, crushes, or stretches a nerve can cause damage and pain. A common example is feeling pain after losing an arm or leg (phantom limb pain).  Compression-related. If a sensory nerve gets trapped or compressed for a long period of time, the blood supply to the nerve can be cut off.  Vascular. Many blood vessel diseases can cause neuropathic pain by decreasing blood supply and oxygen to nerves.  Autoimmune. This type of pain results from diseases in which the body's defense system mistakenly attacks sensory nerves. Examples of autoimmune diseases that can cause neuropathic pain  include lupus and multiple sclerosis.  Infectious. Many types of viral infections can damage sensory nerves and cause pain. Shingles infection  is a common cause of this type of pain.  Inherited. Neuropathic pain can be a symptom of many diseases that are passed down through families (genetic). SIGNS AND SYMPTOMS  The main symptom is pain. Neuropathic pain is often described as:  Burning.  Shock-like.  Stinging.  Hot or cold.  Itching. DIAGNOSIS  No single test can diagnose neuropathic pain. Your health care provider will do a physical exam and ask you about your pain. You may use a pain scale to describe how bad your pain is. You may also have tests to see if you have a high sensitivity to pain and to help find the cause and location of any sensory nerve damage. These tests may include:  Imaging studies, such as:  X-rays.  CT scan.  MRI.  Nerve conduction studies to test how well nerve signals travel through your sensory nerves (electrodiagnostic testing).  Stimulating your sensory nerves through electrodes on your skin and measuring the response in your spinal cord and brain (somatosensory evoked potentials). TREATMENT  Treatment for neuropathic pain may change over time. You may need to try different treatment options or a combination of treatments. Some options include:  Over-the-counter pain relievers.  Prescription medicines. Some medicines used to treat other conditions may also help neuropathic pain. These include medicines to:  Control seizures (anticonvulsants).  Relieve depression (antidepressants).  Prescription-strength pain relievers (narcotics). These are usually used when other pain relievers do not help.  Transcutaneous nerve stimulation (TENS). This uses electrical currents to block painful nerve signals. The treatment is painless.  Topical and local anesthetics. These are medicines that numb the nerves. They can be injected as a nerve block or applied  to the skin.  Alternative treatments, such as:  Acupuncture.  Meditation.  Massage.  Physical therapy.  Pain management programs.  Counseling. HOME CARE INSTRUCTIONS  Learn as much as you can about your condition.  Take medicines only as directed by your health care provider.  Work closely with all your health care providers to find what works best for you.  Have a good support system at home.  Consider joining a chronic pain support group. SEEK MEDICAL CARE IF:  Your pain treatments are not helping.  You are having side effects from your medicines.  You are struggling with fatigue, mood changes, depression, or anxiety.   This information is not intended to replace advice given to you by your health care provider. Make sure you discuss any questions you have with your health care provider.   Document Released: 02/27/2004 Document Revised: 06/22/2014 Document Reviewed: 11/09/2013 Elsevier Interactive Patient Education [employer].

## 2015-08-27 NOTE — ED Notes (Signed)
Upon this RN entry to room, pt sleeping. Respirations even and unlabored.

## 2015-08-27 NOTE — ED Provider Notes (Signed)
[facility] Emergency Department Provider Note  ____________________________________________  Time seen: Approximately 0007 AM  I have reviewed the triage vital signs and the nursing notes.   HISTORY  Chief Complaint Headache    HPI Alice Simmons is a 60 y.o. female who comes into the hospital today with multiple complaints. The patient reports that she has problems with her right eye and pain in her head and left side. The patient has a history of meningioma which is checked every 6 months but she is also had some neuropathy. The patient's spouse reports that she has electrical pains that goes all over her body at different points in time. The patient sees her pain clinic physician and is given gabapentin. The patient also has had some injections as well as pain medicine for these symptoms. According to the patient's spell she does take gabapentin and he thinks she is completed with her hydrocodone. The patient reports that these pains flare up and down from time to time and she typically smokes marijuana when her pain gets too bad but the patient came in tonight because she reports that the pain was too severe and she could not tolerate it. The patient reports that she's felt weak and her pain as a 10 out of 10 in intensity. She reports that the pain is worse on her left side and back. The patient denies any fevers or vomiting. She has not had any problems when she urinates or blood in her urine. The patient is here to have her symptoms treated.   Past Medical History  Diagnosis Date  . Anxiety   . Obesity (BMI 30.0-34.9)   . Hypertension, benign   . Depression, controlled   . Pain of both breasts   . Diabetes mellitus without complication ([place])   . Obesity   . Hyperlipidemia   . Hypertension   . Insomnia   . Breast pain   . Depression   . 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 Arthritis 11/15/2012  . Fibromyalgia   . Brain tumor [facility])     Patient Active  Problem List   Diagnosis Date Noted  . H/O cosmetic surgery 08/14/2015  . Chronic lower extremity pain (Right) 07/18/2015  . Hypertension goal BP (blood pressure) < 140/90 06/05/2015  . Bilateral tinnitus 06/05/2015  . Left breast mass 06/05/2015  . Neurogenic pain 05/07/2015  . Musculoskeletal pain 05/07/2015  . Diffuse myofascial pain syndrome 05/07/2015  . Allodynia 05/07/2015  . 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 Marijuana abuse, continuous 04/25/2015  . Nicotine dependence 04/20/2015  . Fibromyalgia 04/20/2015  . Generalized Paresthesias 04/20/2015  . Paresthesias with subjective weakness 04/20/2015  . Chronic pain 04/20/2015  . Sensitivity to sunlight 04/20/2015  . Continuous illicit drug use [id number]  . Chronic low back pain (Location of Primary Source of Pain) (Bilateral) (R>L) 03/21/2015  . Chronic pain syndrome 03/21/2015  . Lumbar facet syndrome (Bilateral) (R>L) 03/21/2015  . Lumbar radicular pain 03/21/2015  . Lumbosacral radiculopathy 03/21/2015  . Lumbar spondylosis 03/21/2015  . Clinical depression 11/28/2014  . Diabetes mellitus type 2, controlled, without complications ([place]) 99991111  . Mass of left breast on mammogram 11/28/2014  . Back pain, thoracic 11/28/2014  . Hyperlipidemia LDL goal <100 11/28/2014  . Vitamin D deficiency 11/28/2014  . 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 Bipolar disorder, current episode depressed, severe, with psychotic features ([place]) 12/29/2013  . Self-inflicted injury [id number]  . Migraine headache 08/17/2013  . Chronic  insomnia 08/17/2013  . Tobacco abuse 05/01/2013  . Apnea, sleep 03/02/2012  . Anxiety 03/04/2011    Past Surgical History  Procedure Laterality Date  . Abdominal hysterectomy    . Carpal tunnel release    . Breast surgery      benign breast cyst  . Cholecystectomy  2014  . Abdominal hysterectomy  2010  . Bilateral carpal tunnel release    . Breast biopsy Bilateral 1980     EXCISIONAL - NEG  . Breast biopsy Right 1985    CORE W/OUT CLIP - NEG  . Eye surgery      cataract surger both eyes    Current Outpatient Rx  Name  Route  Sig  Dispense  Refill  . amLODipine (NORVASC) 10 MG tablet      TAKE 1 TABLET BY MOUTH EVERY DAY   90 tablet   3   . clonazePAM (KLONOPIN) 0.5 MG tablet   Oral   Take 1 tablet (0.5 mg total) by mouth 2 (two) times daily as needed for anxiety.   30 tablet   5   . gabapentin (NEURONTIN) 400 MG capsule   Oral   Take 1 capsule (400 mg total) by mouth 3 (three) times daily.   90 capsule   5   . HYDROcodone-acetaminophen (NORCO/VICODIN) 5-325 MG tablet   Oral   Take 1 tablet by mouth every 6 (six) hours as needed for moderate pain or severe pain (for post-radiofrequency pain.).   120 tablet   0     Do not place this medication, or any other prescri ...   . labetalol (NORMODYNE) 300 MG tablet      TAKE 1 TABLET BY MOUTH TWICE DAILY   180 tablet   2   . losartan (COZAAR) 50 MG tablet      TAKE 1 TABLET BY MOUTH DAILY   30 tablet   5   . PARoxetine (PAXIL) 30 MG tablet   Oral   Take 1 tablet (30 mg total) by mouth 2 (two) times daily.   60 tablet   5   . pravastatin (PRAVACHOL) 20 MG tablet   Oral   Take 1 tablet (20 mg total) by mouth at bedtime.   30 tablet   5   . Suvorexant (BELSOMRA) 10 MG TABS   Oral   Take 10 mg by mouth at bedtime.   30 tablet   2   . topiramate (TOPAMAX) 100 MG tablet   Oral   Take 2 tablets (200 mg total) by mouth at bedtime.   60 tablet   5   . zolpidem (AMBIEN) 10 MG tablet      TAKE 1 TABLET BY MOUTH EVERY NIGHT AT BEDTIME Patient taking differently: 10 mg at bedtime as needed. TAKE 1 TABLET BY MOUTH EVERY NIGHT AT BEDTIME   30 tablet   0     Allergies Aspirin and Penicillins  Family History  Problem Relation Age of Onset  . Heart disease Mother   . Diabetes Father   . Hypertension Father   . Hyperlipidemia Sister   . Hyperlipidemia Brother   . Heart  disease Brother   . Hyperlipidemia Brother   . Hyperlipidemia Brother   . Hyperlipidemia Brother   . Breast cancer Paternal Aunt   . Diabetes Mother   . Hypertension Mother     Social History Social History  Substance Use Topics  . Smoking status: Current Every Day Smoker -- 1.00 packs/day for 10 years  Types: Cigarettes  . Smokeless tobacco: Never Used  . Alcohol Use: No     Comment: occasional    Review of Systems Constitutional: No fever/chills Eyes: No visual changes. ENT: No sore throat. Cardiovascular: Denies chest pain. Respiratory: Denies shortness of breath. Gastrointestinal: No abdominal pain.  No nausea, no vomiting.  No diarrhea.  No constipation. Genitourinary: Negative for dysuria. Musculoskeletal: Left sided back pain. Skin: Negative for rash. Neurological: Headache  10-point ROS otherwise negative.  ____________________________________________   PHYSICAL EXAM:  VITAL SIGNS: ED Triage Vitals  Enc Vitals Group     BP 08/26/15 2249 138/57 mmHg     Pulse Rate 08/26/15 2249 65     Resp 08/26/15 2249 18     Temp 08/26/15 2249 98.2 F (36.8 C)     Temp Source 08/26/15 2249 Oral     SpO2 08/26/15 2249 100 %     Weight 08/26/15 2249 122 lb (55.339 kg)     Height 08/26/15 2249 5' (1.524 m)     Head Cir --      Peak Flow --      Pain Score 08/26/15 2250 10     Pain Loc --      Pain Edu? --      Excl. in [place]? --     Constitutional: Alert and oriented. Well appearing and in moderate distress. Eyes: Conjunctivae are normal. PERRL. EOMI. Head: Atraumatic. Nose: No congestion/rhinnorhea. Mouth/Throat: Mucous membranes are moist.  Oropharynx non-erythematous. Cardiovascular: Normal rate, regular rhythm. Grossly normal heart sounds.  Good peripheral circulation. Respiratory: Normal respiratory effort.  No retractions. Lungs CTAB. Gastrointestinal: Soft and nontender. No distention. Positive bowel sounds no left CVA tenderness to  palpation Musculoskeletal: No lower extremity tenderness nor edema.   Neurologic:  Normal speech and language. No gross focal neurologic deficits are appreciated.  Skin:  Skin is warm, dry and intact.  Psychiatric: Mood and affect are normal.  ____________________________________________   LABS (all labs ordered are listed, but only abnormal results are displayed)  Labs Reviewed - No data to display ____________________________________________  EKG  None ____________________________________________  RADIOLOGY  None ____________________________________________   PROCEDURES  Procedure(s) performed: None  Critical Care performed: No  ____________________________________________   INITIAL IMPRESSION / ASSESSMENT AND PLAN / ED COURSE  Pertinent labs & imaging results that were available during my care of the patient were reviewed by me and considered in my medical decision making (see chart for details).  This is a 60 year old female who has a history of migraines as well as some neuropathy who comes into the hospital today with some headache and left-sided pain. The patient has had these pains before but reports it was very bad. I did give the patient dose of Reglan and Benadryl as well as a liter of normal saline. The patient was unable to take Toradol as she is allergic to aspirin and any other derivatives. I did give the patient a dose of Solu-Medrol and her pain is improved. The patient was sleeping comfortably and when we awoke her she reports her pain was a 0. Given the patient's symptom chronicity I do not feel that we need to do any imaging or blood work at this time. I will discharge her to have her follow back up with her primary care physician for further evaluation of her neuropathic pain as well as her migraines. ____________________________________________   FINAL CLINICAL IMPRESSION(S) / ED DIAGNOSES  Final diagnoses:  Neuropathy ([place])  Acute nonintractable  headache, unspecified headache type  Left-sided low back pain without sciatica      Loney Hering, MD 08/27/15 [phone]

## 2015-08-28 ENCOUNTER — Ambulatory Visit: Payer: Medicare Other | Attending: Pain Medicine | Admitting: Pain Medicine

## 2015-08-28 DIAGNOSIS — F315 Bipolar disorder, current episode depressed, severe, with psychotic features: Secondary | ICD-10-CM

## 2015-08-28 NOTE — Progress Notes (Signed)
Patient ID: Alice Simmons, female   DOB: 1955-09-23, 60 y.o.   MRN: [id number] Today I was called to the front desk do to a commotion. Upon arrival, the patient was screaming and saying that she was having severe spasms of her lower back. She arrived to the clinic on her own accord without any problems and lateral and also she started talking to the receptionist, she went into this event that involved screaming, thrashing around, crying, and upon evaluating the patient I can feel no spasms of the lower back and the family nothing that would explain this creams and everything else that were coming from the patient. Since this is not an emergency room and we do not have the capabilities of looking at any acute medical issues that could explain this, I decided to send the patient down to the emergency room to be evaluated. The patient indicated that this had happened recently and she went to the emergency room but they couldn't find anything. We'll be sending her today for evaluation but if this again comes back with no findings, I have my strong suspicion is that this may be psychiatric. I have all ready explain to her husband that this is outside of my realm of expertise and this is not the type of thing that we treated here.

## 2015-08-28 NOTE — Progress Notes (Signed)
Patient ID: Alice Simmons, female   DOB: 26-Aug-1955, 60 y.o.   MRN: [id number] This is a follow-up to the note written at 8:30 a.m.Marland Kitchen Almost immediately after the patient was told that she would be sent downstairs for an evaluation, she immediately stopped screaming and crying and she immediately switched to a hysterical laugh and said that she wouldn't go down stairs. She told her husband that she wanted to go home and she simply stopped the show, got up and walked out of the clinic as if nothing had happened. The same nurse that almost had to carry her to the room when she started with the screaming and crying, heard the patient say that "he never give me any pain medicine". Butch Penny). After she got up and left, everybody in the clinic was astonished at what had happened and immediately understood that there was something really wrong with this patient in terms of her psychiatric behavior.  Unfortunately, he comes this is not the type of thing that we treat at this facility, I will be sending this patient a letter suggesting that she find another place for her care and strongly recommending that this be a psychiatric-based facility.

## 2015-08-28 NOTE — Progress Notes (Signed)
During admission process patient leaned forward to sign consent and immediately began screaming. She was assisted to the stretcher in consult room 1 by Charna Busman and Dr. Mohammed Kindle. She was thrashing around and yelling while laying on the stretcher. Dr. Dossie Arbour was notified and came in to assess the patient. During his assessment the patients significant other entered the room. He stated this had happened two days ago and she was seen in the Emergency room. Dr. Dossie Arbour discussed a plan of care that included the patient being taken to the emergency room. He left the room and the patient started saying she wanted to leave, got up and put her coats on still crying and hysterical. Stating that he wouldn't give her any medicine and she would just have to live this way. She began to break out in hysterical laughter before leaving. I encouraged them to go to the emergency room for care. Neither agreed nor disagreed. Walked out without assistance.

## 2015-09-09 ENCOUNTER — Other Ambulatory Visit: Payer: Self-pay

## 2015-09-09 DIAGNOSIS — F315 Bipolar disorder, current episode depressed, severe, with psychotic features: Secondary | ICD-10-CM

## 2015-09-09 MED ORDER — LOSARTAN POTASSIUM 50 MG PO TABS: 50.0000 mg | ORAL_TABLET | Freq: Every day | ORAL | Status: DC

## 2015-09-09 MED ORDER — PAROXETINE HCL 30 MG PO TABS: 30.0000 mg | ORAL_TABLET | Freq: Two times a day (BID) | ORAL | Status: DC

## 2015-10-07 ENCOUNTER — Other Ambulatory Visit: Payer: Self-pay

## 2015-10-07 NOTE — Telephone Encounter (Signed)
Patient requesting refill. 

## 2015-10-08 MED ORDER — LOSARTAN POTASSIUM 50 MG PO TABS: 50.0000 mg | ORAL_TABLET | Freq: Every day | ORAL | Status: DC

## 2015-10-08 NOTE — Telephone Encounter (Signed)
Normal cr and K+; rx approved

## 2015-10-10 ENCOUNTER — Ambulatory Visit (INDEPENDENT_AMBULATORY_CARE_PROVIDER_SITE_OTHER): Payer: Medicare Other | Admitting: Family Medicine

## 2015-10-10 ENCOUNTER — Encounter: Payer: Self-pay | Admitting: Family Medicine

## 2015-10-10 ENCOUNTER — Other Ambulatory Visit
Admission: RE | Admit: 2015-10-10 | Discharge: 2015-10-10 | Disposition: A | Discharge status: Home / Self Care | Payer: Medicare Other | Source: Ambulatory Visit | Attending: Family Medicine | Admitting: Family Medicine

## 2015-10-10 VITALS — BP 102/58 | HR 64 | Temp 99.3°F | Resp 16 | Ht 60.0 in | Wt 131.0 lb

## 2015-10-10 DIAGNOSIS — R748 Abnormal levels of other serum enzymes: Secondary | ICD-10-CM | POA: Diagnosis not present

## 2015-10-10 DIAGNOSIS — E559 Vitamin D deficiency, unspecified: Secondary | ICD-10-CM

## 2015-10-10 DIAGNOSIS — D72820 Lymphocytosis (symptomatic): Secondary | ICD-10-CM | POA: Insufficient documentation

## 2015-10-10 DIAGNOSIS — G629 Polyneuropathy, unspecified: Secondary | ICD-10-CM | POA: Insufficient documentation

## 2015-10-10 DIAGNOSIS — R5383 Other fatigue: Secondary | ICD-10-CM | POA: Insufficient documentation

## 2015-10-10 DIAGNOSIS — R531 Weakness: Secondary | ICD-10-CM

## 2015-10-10 DIAGNOSIS — E785 Hyperlipidemia, unspecified: Secondary | ICD-10-CM | POA: Diagnosis not present

## 2015-10-10 DIAGNOSIS — E119 Type 2 diabetes mellitus without complications: Secondary | ICD-10-CM

## 2015-10-10 DIAGNOSIS — R202 Paresthesia of skin: Secondary | ICD-10-CM | POA: Diagnosis present

## 2015-10-10 DIAGNOSIS — Z5181 Encounter for therapeutic drug level monitoring: Secondary | ICD-10-CM

## 2015-10-10 DIAGNOSIS — R634 Abnormal weight loss: Secondary | ICD-10-CM

## 2015-10-10 LAB — CBC WITH DIFFERENTIAL/PLATELET
BASOS PCT: 1 %
Basophils Absolute: 0 10*3/uL (ref 0–0.1)
Eosinophils Absolute: 0.3 10*3/uL (ref 0–0.7)
Eosinophils Relative: 4 %
HEMATOCRIT: 39.5 % (ref 35.0–47.0)
HEMOGLOBIN: 13.3 g/dL (ref 12.0–16.0)
LYMPHS PCT: 34 %
Lymphs Abs: 2.5 10*3/uL (ref 1.0–3.6)
MCH: 31.3 pg (ref 26.0–34.0)
MCHC: 33.7 g/dL (ref 32.0–36.0)
MCV: 93.1 fL (ref 80.0–100.0)
Monocytes Absolute: 0.6 10*3/uL (ref 0.2–0.9)
Monocytes Relative: 8 %
NEUTROS ABS: 3.9 10*3/uL (ref 1.4–6.5)
NEUTROS PCT: 53 %
Platelets: 289 10*3/uL (ref 150–440)
RBC: 4.25 MIL/uL (ref 3.80–5.20)
RDW: 13.7 % (ref 11.5–14.5)
WBC: 7.3 10*3/uL (ref 3.6–11.0)

## 2015-10-10 LAB — COMPREHENSIVE METABOLIC PANEL
ALBUMIN: 4.2 g/dL (ref 3.5–5.0)
ALK PHOS: 70 U/L (ref 38–126)
ALT: 18 U/L (ref 14–54)
AST: 21 U/L (ref 15–41)
Anion gap: 7 (ref 5–15)
BILIRUBIN TOTAL: 0.5 mg/dL (ref 0.3–1.2)
BUN: 26 mg/dL — ABNORMAL HIGH (ref 6–20)
CO2: 24 mmol/L (ref 22–32)
Calcium: 9.2 mg/dL (ref 8.9–10.3)
Chloride: 109 mmol/L (ref 101–111)
Creatinine, Ser: 0.99 mg/dL (ref 0.44–1.00)
GFR calc Af Amer: >60 mL/min (ref >60)
GFR calc non Af Amer: >60 mL/min (ref >60)
GLUCOSE: 87 mg/dL (ref 65–99)
POTASSIUM: 3.5 mmol/L (ref 3.5–5.1)
Sodium: 140 mmol/L (ref 135–145)
TOTAL PROTEIN: 6.9 g/dL (ref 6.5–8.1)

## 2015-10-10 LAB — C-REACTIVE PROTEIN: CRP: 0.6 mg/dL (ref <1.0)

## 2015-10-10 LAB — HEMOGLOBIN A1C: HEMOGLOBIN A1C: 5.5 % (ref 4.0–6.0)

## 2015-10-10 LAB — VITAMIN B12: VITAMIN B 12: 480 pg/mL (ref 180–914)

## 2015-10-10 LAB — FOLATE: FOLATE: 18.2 ng/mL (ref >5.9)

## 2015-10-10 NOTE — Assessment & Plan Note (Signed)
Check labs 

## 2015-10-10 NOTE — Assessment & Plan Note (Signed)
Check A1c today.

## 2015-10-10 NOTE — Patient Instructions (Addendum)
Please do not take more than 3,000 mg per 24 hours of tylenol (acetaminophen)  Try to limit saturated fats in your diet (bologna, hot dogs, barbeque, cheeseburgers, hamburgers, steak, bacon, sausage, cheese, etc.) and get more fresh fruits, vegetables, and whole grains  Try to eat better and get 25 grams of fiber a day, and less cheese  Let's get you to a neuropathy specialist  Return in 2 weeks for further work-up  Cholesterol Cholesterol is a white, waxy, fat-like substance needed by your body in small amounts. The liver makes all the cholesterol you need. Cholesterol is carried from the liver by the blood through the blood vessels. Deposits of cholesterol (plaque) may build up on blood vessel walls. These make the arteries narrower and stiffer. Cholesterol plaques increase the risk for heart attack and stroke.  You cannot feel your cholesterol level even if it is very high. The only way to know it is high is with a blood test. Once you know your cholesterol levels, you should keep a record of the test results. Work with your health care provider to keep your levels in the desired range.  WHAT DO THE RESULTS MEAN?  Total cholesterol is a rough measure of all the cholesterol in your blood.   LDL is the so-called bad cholesterol. This is the type that deposits cholesterol in the walls of the arteries. You want this level to be low.   HDL is the good cholesterol because it cleans the arteries and carries the LDL away. You want this level to be high.  Triglycerides are fat that the body can either burn for energy or store. High levels are closely linked to heart disease.  WHAT ARE THE DESIRED LEVELS OF CHOLESTEROL?  Total cholesterol below 200.   LDL below 100 for people at risk, below 70 for those at very high risk.   HDL above 50 is good, above 60 is best.   Triglycerides below 150.  HOW CAN I LOWER MY CHOLESTEROL?  Diet. Follow your diet programs as directed by your health  care provider.   Choose fish or white meat chicken and [place], roasted or baked. Limit fatty cuts of red meat, fried foods, and processed meats, such as sausage and lunch meats.   Eat lots of fresh fruits and vegetables.  Choose whole grains, beans, pasta, potatoes, and cereals.   Use only small amounts of olive, corn, or canola oils.   Avoid butter, mayonnaise, shortening, or palm kernel oils.  Avoid foods with trans fats.   Drink skim or nonfat milk and eat low-fat or nonfat yogurt and cheeses. Avoid whole milk, cream, ice cream, egg yolks, and full-fat cheeses.   Healthy desserts include angel food cake, ginger snaps, animal crackers, hard candy, popsicles, and low-fat or nonfat frozen yogurt. Avoid pastries, cakes, pies, and cookies.   Exercise. Follow your exercise programs as directed by your health care provider.   A regular program helps decrease LDL and raise HDL.   A regular program helps with weight control.   Do things that increase your activity level like gardening, walking, or taking the stairs. Ask your health care provider about how you can be more active in your daily life.   Medicine. Take medicine only as directed by your health care provider.   Medicine may be prescribed by your health care provider to help lower cholesterol and decrease the risk for heart disease.   If you have several risk factors, you may need medicine even if  your levels are normal.   This information is not intended to replace advice given to you by your health care provider. Make sure you discuss any questions you have with your health care provider.   Document Released: 02/24/2001 Document Revised: 06/22/2014 Document Reviewed: 03/15/2013 Elsevier Interactive Patient Education [employer].

## 2015-10-10 NOTE — Assessment & Plan Note (Signed)
Reviewed last lipids; work on [employer]; no new medicines for now; rcheck in 8-12 weeks

## 2015-10-10 NOTE — Progress Notes (Signed)
BP 102/58 mmHg  Pulse 64  Temp(Src) 99.3 F (37.4 C) (Oral)  Resp 16  Ht 5' (1.524 m)  Wt 131 lb (59.421 kg)  BMI 25.58 kg/m2  SpO2 98%  LMP  (Approximate)   Subjective:    Patient ID: Alice Simmons, female    DOB: 03-07-1956, 60 y.o.   MRN: [id number]  HPI: Alice Simmons is a 60 y.o. female  Chief Complaint  Patient presents with  . Pain    nuropathy all over and worsening   Patient is new to me; c/o multiple problems She was seen by pain clinicon March 15th and apparently caused quite a stir; see note from pain clinic Facial drooping Muscle contractions Sun sensitivity Feeling full early Excessive sweating; does not think she has menopause Tight in the cheeks; always tight Fatigued; tingling on the right side; dizziness, light-headed She is seeing a neurologist in [place]; they did EMG and nerve testing; small fiber neuropathy; no biopsy They think her work-up is done and referred her to the pain clinic; he started to do the shots in the back then sent a letter to have her see doctor at [facility] for psychiatrist-based pain clinic Skin color is changing; feels like cheek is pulling in and she has bite lines Feels muscles are moving inside, jumping  Progressively getting worse Lost weight; was 200 pounds; husband says that her weight loss is not explained by exercise and weight loss; xanax made her gain weight; topamax was started years ago, but the dose has been raised lately  Last CBC was high WBC, high lymph, high platelets High SGPT (ALT), mild Little tylenol, 4 at a time; STOP Prediabetes Last A1c 5.8 Cholesterol high; watching what she eats; does eat cheese Hx of vit D deficiency; took the pills, not taking any vitamin now Mammogram UTD Colonoscopy 3 years ago  Depression screen [facility] 2/9 08/14/2015 07/18/2015 06/05/2015 05/30/2015 05/07/2015  Decreased Interest 0 0 0 0 0  Down, Depressed, Hopeless 1 0 0 0 0  PHQ - 2 Score 1 0 0 0 0  Altered sleeping - - - -  -  Tired, decreased energy - - - - -  Change in appetite - - - - -  Feeling bad or failure about yourself  - - - - -  Trouble concentrating - - - - -  Moving slowly or fidgety/restless - - - - -  Suicidal thoughts - - - - -  PHQ-9 Score - - - - -  Difficult doing work/chores - - - - -   Relevant past medical, surgical, family and social history reviewed and updated as indicated.  Past Medical History  Diagnosis Date  . Anxiety   . Obesity (BMI 30.0-34.9)   . Hypertension, benign   . Depression, controlled   . Pain of both breasts   . Diabetes mellitus without complication ([place])   . Obesity   . Hyperlipidemia   . Hypertension   . Insomnia   . Breast pain   . Depression   . 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 Arthritis 11/15/2012  . Fibromyalgia   . Brain tumor [facility])    Past Surgical History  Procedure Laterality Date  . Abdominal hysterectomy    . Carpal tunnel release    . Breast surgery      benign breast cyst  . Cholecystectomy  2014  . Abdominal hysterectomy  2010  . Bilateral carpal tunnel release    . Breast biopsy Bilateral 1980  EXCISIONAL - NEG  . Breast biopsy Right 1985    CORE W/OUT CLIP - NEG  . Eye surgery      cataract surger both eyes   Family History  Problem Relation Age of Onset  . Heart disease Mother   . Diabetes Father   . Hypertension Father   . Hyperlipidemia Sister   . Hyperlipidemia Brother   . Heart disease Brother   . Hyperlipidemia Brother   . Hyperlipidemia Brother   . Hyperlipidemia Brother   . Breast cancer Paternal Aunt   . Diabetes Mother   . Hypertension Mother    Social History  Substance Use Topics  . Smoking status: Current Every Day Smoker -- 1.00 packs/day for 10 years    Types: Cigarettes  . Smokeless tobacco: Never Used  . Alcohol Use: No     Comment: occasional   Interim medical history since last visit reviewed. Allergies and medications reviewed and updated.  Review of Systems Per HPI unless  specifically indicated above     Objective:    BP 102/58 mmHg  Pulse 64  Temp(Src) 99.3 F (37.4 C) (Oral)  Resp 16  Ht 5' (1.524 m)  Wt 131 lb (59.421 kg)  BMI 25.58 kg/m2  SpO2 98%  LMP  (Approximate)  Wt Readings from Last 3 Encounters:  10/10/15 131 lb (59.421 kg)  08/26/15 122 lb (55.339 kg)  08/14/15 134 lb (60.782 kg)    Physical Exam  Constitutional: She appears well-developed and well-nourished.  Hispanic female; no distress  HENT:  Mouth/Throat: Oropharynx is clear and moist.  Eyes: No scleral icterus.  Neck: No JVD present.  Cardiovascular: Normal rate and regular rhythm.   Pulmonary/Chest: Effort normal and breath sounds normal.  Abdominal: Soft. Bowel sounds are normal. She exhibits no distension.  Musculoskeletal: She exhibits no edema.  Neurological: She is alert.  Twitches and fasciculations seen in the face and arms; ambulatory without assistance  Skin:  Pallor of nails distally and proximal erythema along cuticles  Psychiatric:  Emotionally labile; good eye contact with examiner    Results for orders placed or performed in visit on 06/05/15  CBC with Differential/Platelet  Result Value Ref Range   WBC 11.1 (H) 3.4 - 10.8 x10E3/uL   RBC 4.68 3.77 - 5.28 x10E6/uL   Hemoglobin 14.3 11.1 - 15.9 g/dL   Hematocrit 42.6 34.0 - 46.6 %   MCV 91 79 - 97 fL   MCH 30.6 26.6 - 33.0 pg   MCHC 33.6 31.5 - 35.7 g/dL   RDW 14.6 12.3 - 15.4 %   Platelets 456 (H) 150 - 379 x10E3/uL   Neutrophils 61 %   Lymphs 31 %   Monocytes 7 %   Eos 1 %   Basos 0 %   Neutrophils Absolute 6.7 1.4 - 7.0 x10E3/uL   Lymphocytes Absolute 3.5 (H) 0.7 - 3.1 x10E3/uL   Monocytes Absolute 0.7 0.1 - 0.9 x10E3/uL   EOS (ABSOLUTE) 0.1 0.0 - 0.4 x10E3/uL   Basophils Absolute 0.0 0.0 - 0.2 x10E3/uL   Immature Granulocytes 0 %   Immature Grans (Abs) 0.0 0.0 - 0.1 x10E3/uL  Comprehensive metabolic panel  Result Value Ref Range   Glucose 98 65 - 99 mg/dL   BUN 18 6 - 24 mg/dL    Creatinine, Ser 0.88 0.57 - 1.00 mg/dL   GFR calc non Af Amer 73 >59 mL/min/1.73   GFR calc Af Amer 84 >59 mL/min/1.73   BUN/Creatinine Ratio 20 9 - 23  Sodium 143 134 - 144 mmol/L   Potassium 4.4 3.5 - 5.2 mmol/L   Chloride 106 96 - 106 mmol/L   CO2 23 18 - 29 mmol/L   Calcium 9.6 8.7 - 10.2 mg/dL   Total Protein 6.4 6.0 - 8.5 g/dL   Albumin 4.2 3.5 - 5.5 g/dL   Globulin, Total 2.2 1.5 - 4.5 g/dL   Albumin/Globulin Ratio 1.9 1.1 - 2.5   Bilirubin Total 0.2 0.0 - 1.2 mg/dL   Alkaline Phosphatase 79 39 - 117 IU/L   AST 20 0 - 40 IU/L   ALT 39 (H) 0 - 32 IU/L  Hemoglobin A1c  Result Value Ref Range   Hgb A1c MFr Bld 5.8 (H) 4.8 - 5.6 %   Est. average glucose Bld gHb Est-mCnc 120 mg/dL  Lipid panel  Result Value Ref Range   Cholesterol, Total 252 (H) 100 - 199 mg/dL   Triglycerides 124 0 - 149 mg/dL   HDL 47 >39 mg/dL   VLDL Cholesterol Cal 25 5 - 40 mg/dL   LDL Calculated 180 (H) 0 - 99 mg/dL   Chol/HDL Ratio 5.4 (H) 0.0 - 4.4 ratio units  TSH  Result Value Ref Range   TSH 3.330 0.450 - 4.500 uIU/mL  POCT UA - Microalbumin  Result Value Ref Range   Microalbumin Ur, POC 50 mg/L   Creatinine, POC  mg/dL   Albumin/Creatinine Ratio, Urine, POC        Assessment & Plan:   Problem List Items Addressed This Visit      Endocrine   Diabetes mellitus type 2, controlled, without complications (HCC)    Check A1c today      Relevant Orders   Hgb A1c w/o eAG     Nervous and Auditory   Small fiber neuropathy (HCC) - Primary   Relevant Orders   Protein Electrophoresis, (serum)   Ceruloplasmin   Heavy metals, random urine   Protein Electrophoresis, Urine Rflx.   B12 and Folate Panel   RPR   Ambulatory referral to Neurology     Other   Hyperlipidemia LDL goal <100 (Chronic)    Reviewed last lipids; work on [employer]; no new medicines for now; rcheck in 8-12 weeks      Paresthesias with subjective weakness (Chronic)    Check labs      Relevant Orders   Protein  Electrophoresis, (serum)   Ceruloplasmin   Heavy metals, random urine   Protein Electrophoresis, Urine Rflx.   B12 and Folate Panel   RPR   Ambulatory referral to Neurology   Vitamin D deficiency    Check labs      Relevant Orders   VITAMIN D 25 Hydroxy (Vit-D Deficiency, Fractures)   Fatigue   Relevant Orders   CBC with Differential/Platelet   Abnormal weight loss   Relevant Orders   C-reactive protein   Encounter for therapeutic drug level monitoring   Lymphocytosis   Relevant Orders   CBC with Differential/Platelet   Elevated liver enzymes   Relevant Orders   Comprehensive metabolic panel      Follow up plan: Return in about 2 weeks (around 10/24/2015) for further work-up.  An after-visit summary was printed and given to the patient at [facility].  Please see the patient instructions which may contain other information and recommendations beyond what is mentioned above in the assessment and plan.  No orders of the defined types were placed in this encounter.    Orders Placed This Encounter  Procedures  .  Protein Electrophoresis, (serum)  . Ceruloplasmin  . Heavy metals, random urine  . Protein Electrophoresis, Urine Rflx.  . B12 and Folate Panel  . CBC with Differential/Platelet  . Comprehensive metabolic panel  . VITAMIN D 25 Hydroxy (Vit-D Deficiency, Fractures)  . C-reactive protein  . RPR  . Hgb A1c w/o eAG  . Ambulatory referral to Neurology   [facility] neuroopathy specialist, Dr. Elmer Bales; off during the week  Face-to-face time with patient was more than 40 minutes, >50% time spent counseling and coordination of care

## 2015-10-11 ENCOUNTER — Other Ambulatory Visit: Payer: Self-pay | Admitting: Family Medicine

## 2015-10-11 DIAGNOSIS — M7918 Myalgia, other site: Secondary | ICD-10-CM

## 2015-10-11 LAB — VITAMIN D 25 HYDROXY (VIT D DEFICIENCY, FRACTURES): Vit D, 25-Hydroxy: 23.3 ng/mL — ABNORMAL LOW (ref 30.0–100.0)

## 2015-10-11 LAB — PROTEIN ELECTROPHORESIS, SERUM
A/G RATIO SPE: 1.6 (ref 0.7–1.7)
ALPHA-1-GLOBULIN: 0.2 g/dL (ref 0.0–0.4)
ALPHA-2-GLOBULIN: 0.6 g/dL (ref 0.4–1.0)
Albumin ELP: 3.9 g/dL (ref 2.9–4.4)
BETA GLOBULIN: 0.9 g/dL (ref 0.7–1.3)
GLOBULIN, TOTAL: 2.4 g/dL (ref 2.2–3.9)
Gamma Globulin: 0.8 g/dL (ref 0.4–1.8)
Total Protein ELP: 6.3 g/dL (ref 6.0–8.5)

## 2015-10-11 LAB — RPR: RPR: NONREACTIVE

## 2015-10-11 LAB — CERULOPLASMIN: Ceruloplasmin: 26.3 mg/dL (ref 19.0–39.0)

## 2015-10-11 NOTE — Assessment & Plan Note (Signed)
Add on ANA

## 2015-10-14 ENCOUNTER — Telehealth: Payer: Self-pay | Admitting: Family Medicine

## 2015-10-14 DIAGNOSIS — T561X1A Toxic effect of mercury and its compounds, accidental (unintentional), initial encounter: Secondary | ICD-10-CM

## 2015-10-14 DIAGNOSIS — G629 Polyneuropathy, unspecified: Secondary | ICD-10-CM

## 2015-10-14 LAB — HEAVY METALS PROFILE, URINE
ARSENIC (TOTAL),U: 57 ug/L — AB (ref 0–50)
ARSENIC(INORGANIC), U: NOT DETECTED ug/L (ref 0–19)
Creatinine(Crt),U: 1.25 g/L (ref 0.30–3.00)
Lead, Rand Ur: NOT DETECTED ug/L (ref 0–49)
MERCURY UR: 42 ug/L — AB (ref 0–19)
Mercury/Creat. Ratio: 34 ug/g creat — ABNORMAL HIGH (ref 0–5)

## 2015-10-14 LAB — PROTEIN ELECTRO, RANDOM URINE
Albumin ELP, Urine: 45.1 %
Alpha-1-Globulin, U: 8.9 %
Alpha-2-Globulin, U: 15.3 %
Beta Globulin, U: 22.4 %
GAMMA GLOBULIN, U: 8.4 %
Total Protein, Urine: 9.4 mg/dL

## 2015-10-14 NOTE — Telephone Encounter (Signed)
Patient's mercury and arsenic levels are elevated I called the [facility]; reviewed results with staff member, Malachy Mood, as well as clinical symptoms I do not know about seafood intake; they suggested epeat after 1 week of no seafood with spot collection, then do 24 hour urine collection for repeat for confirmation The arsenic detected is organic which could be food intake Levels are elevated, but not incredibly so; [facility] is closest facility should treatment be needed

## 2015-10-15 NOTE — Assessment & Plan Note (Signed)
Check 24 hour level

## 2015-10-15 NOTE — Telephone Encounter (Signed)
I left msg, asked him/her to call me back about labs

## 2015-10-15 NOTE — Telephone Encounter (Signed)
I spoke with toxicologist at [facility]; reviewed numbers, symptoms; he recommended that we do a 24 hour urine collection in one week NO fish or seafood products at all for one week, then patient collects urine for 24 hours Please let husband know the plan [facility] does not have a toxicologist, but we'll still have her see the nerve doctor at [facility] as planned

## 2015-10-15 NOTE — Assessment & Plan Note (Signed)
Check urine 24 hour arsenic and mercury

## 2015-10-15 NOTE — Telephone Encounter (Signed)
I spoke with husband, and he says that patient eats practically no fish at all, and she has not had any for quite some time They would like to go to [facility] I called [facility], Dr. Salomon Fick

## 2015-10-16 NOTE — Telephone Encounter (Signed)
The orders should already be in the system; I ordered them 10/15/15, 24 hour urine mercury and arsenic; they just have to pick up the containers; thanks

## 2015-10-16 NOTE — Telephone Encounter (Signed)
Husband notified, please order the test

## 2015-10-17 ENCOUNTER — Emergency Department
Admission: EM | Admit: 2015-10-17 | Discharge: 2015-10-17 | Disposition: A | Discharge status: Home / Self Care | Payer: Medicare Other | Attending: Emergency Medicine | Admitting: Emergency Medicine

## 2015-10-17 ENCOUNTER — Encounter: Payer: Self-pay | Admitting: Family Medicine

## 2015-10-17 ENCOUNTER — Encounter: Payer: Self-pay | Admitting: Emergency Medicine

## 2015-10-17 DIAGNOSIS — E785 Hyperlipidemia, unspecified: Secondary | ICD-10-CM | POA: Insufficient documentation

## 2015-10-17 DIAGNOSIS — F315 Bipolar disorder, current episode depressed, severe, with psychotic features: Secondary | ICD-10-CM | POA: Insufficient documentation

## 2015-10-17 DIAGNOSIS — Z79899 Other long term (current) drug therapy: Secondary | ICD-10-CM | POA: Insufficient documentation

## 2015-10-17 DIAGNOSIS — E119 Type 2 diabetes mellitus without complications: Secondary | ICD-10-CM | POA: Diagnosis not present

## 2015-10-17 DIAGNOSIS — M1990 Unspecified osteoarthritis, unspecified site: Secondary | ICD-10-CM | POA: Diagnosis not present

## 2015-10-17 DIAGNOSIS — F17210 Nicotine dependence, cigarettes, uncomplicated: Secondary | ICD-10-CM | POA: Insufficient documentation

## 2015-10-17 DIAGNOSIS — Z85841 Personal history of malignant neoplasm of brain: Secondary | ICD-10-CM | POA: Diagnosis not present

## 2015-10-17 DIAGNOSIS — I10 Essential (primary) hypertension: Secondary | ICD-10-CM | POA: Diagnosis not present

## 2015-10-17 DIAGNOSIS — F419 Anxiety disorder, unspecified: Secondary | ICD-10-CM | POA: Diagnosis not present

## 2015-10-17 DIAGNOSIS — M545 Low back pain: Secondary | ICD-10-CM | POA: Diagnosis present

## 2015-10-17 DIAGNOSIS — E669 Obesity, unspecified: Secondary | ICD-10-CM | POA: Insufficient documentation

## 2015-10-17 DIAGNOSIS — G8929 Other chronic pain: Secondary | ICD-10-CM | POA: Diagnosis not present

## 2015-10-17 LAB — URINALYSIS COMPLETE WITH MICROSCOPIC (ARMC ONLY)
BACTERIA UA: NONE SEEN
BILIRUBIN URINE: NEGATIVE
Glucose, UA: NEGATIVE mg/dL
Hgb urine dipstick: NEGATIVE
Ketones, ur: NEGATIVE mg/dL
LEUKOCYTES UA: NEGATIVE
Nitrite: NEGATIVE
PH: 8 (ref 5.0–8.0)
PROTEIN: NEGATIVE mg/dL
RBC / HPF: NONE SEEN RBC/hpf (ref 0–5)
Specific Gravity, Urine: 1.002 — ABNORMAL LOW (ref 1.005–1.030)
WBC UA: NONE SEEN WBC/hpf (ref 0–5)

## 2015-10-17 LAB — COMPREHENSIVE METABOLIC PANEL
ALT: 17 U/L (ref 14–54)
AST: 23 U/L (ref 15–41)
Albumin: 4.3 g/dL (ref 3.5–5.0)
Alkaline Phosphatase: 63 U/L (ref 38–126)
Anion gap: 7 (ref 5–15)
BILIRUBIN TOTAL: 0.3 mg/dL (ref 0.3–1.2)
BUN: 19 mg/dL (ref 6–20)
CHLORIDE: 106 mmol/L (ref 101–111)
CO2: 23 mmol/L (ref 22–32)
Calcium: 9 mg/dL (ref 8.9–10.3)
Creatinine, Ser: 0.82 mg/dL (ref 0.44–1.00)
Glucose, Bld: 102 mg/dL — ABNORMAL HIGH (ref 65–99)
POTASSIUM: 3.9 mmol/L (ref 3.5–5.1)
Sodium: 136 mmol/L (ref 135–145)
TOTAL PROTEIN: 7.1 g/dL (ref 6.5–8.1)

## 2015-10-17 LAB — CBC
HEMATOCRIT: 38.4 % (ref 35.0–47.0)
Hemoglobin: 13 g/dL (ref 12.0–16.0)
MCH: 30.9 pg (ref 26.0–34.0)
MCHC: 33.9 g/dL (ref 32.0–36.0)
MCV: 91.1 fL (ref 80.0–100.0)
PLATELETS: 313 10*3/uL (ref 150–440)
RBC: 4.21 MIL/uL (ref 3.80–5.20)
RDW: 13.8 % (ref 11.5–14.5)
WBC: 8.5 10*3/uL (ref 3.6–11.0)

## 2015-10-17 NOTE — Discharge Instructions (Signed)
Dolor crónico  (Chronic Pain)  El dolor crónico puede definirse como aquel que aparece y desaparece, y dura entre 3 y 6 meses o más. Puede tener muchas causas, lo que hace difícil realizar un diagnóstico. Hay muchas opciones de tratamiento disponibles. Sin embargo, hallar un tratamiento que resulte bien para usted puede requerir diferentes abordajes hasta que se encuentre el adecuado. Muchas personas se benefician con una combinación de uno o más tipos de tratamiento para controlar el dolor.  SÍNTOMAS   El dolor crónico puede producirse en cualquier parte del organismo y puede variar desde leve a muy intenso. Algunos tipos de dolor crónico son:  · Dolor de cabeza.  · Dolor lumbar.  · Dolor por cáncer.  · Dolor por artritis.  · Dolor neurogénico. Este último dolor es el resultado de lesiones nerviosas.   Las personas que sufren dolor crónico también pueden tener otros síntomas como:  · Depresión.  · Enojo.  · Insomnio.  · Ansiedad.  DIAGNÓSTICO   El medico hará el diagnóstico de la afección luego de un tiempo. En muchos casos, el foco inicial estará en excluir trastornos que sean causa de dolor. El médico indicará algunos análisis para diagnosticar el problema, según los síntomas que presente. Entre estas pruebas se incluyen:   · Análisis de sangre.    · Tomografía computada.    · Resonancia magnética.    · Radiografías.    · Ecografías.    · Estudios de conducción nerviosa.    Es posible que deba consultar a un especialista.   TRATAMIENTO   Hallar un tratamiento que de resultado puede llevar mucho tiempo. Posiblemente lo derivarán a un especialista. El especialista podrá indicarle medicamentos o terapias, como:   · Meditación de atención plena o yoga.  · Medicamentos o inyecciones de anestésicos o analgésicos en la columna vertebral o en la zona que la rodea.  · Estimulación eléctrica local.  · Acupuntura.    · Terapia de masajes.    · Terapia con aromas, colores, luz o sonidos.    · Biorretroalimentación.     · Trabajo con un fisioterapeuta para aliviar la rigidez.    · Ejercicios regulares y suaves.    · Terapia cognitivo conductual.    · Grupos de apoyo.    En algunos casos podrá indicarse la cirugía.   INSTRUCCIONES PARA EL CUIDADO EN EL HOGAR   · Tome todos los medicamentos como le indicó el médico.    · Disminuya el estrés en su vida relajándose y haciendo ciertas cosas, como escuchar música tranquila.    · Haga ejercicios o actividades según las indicaciones del médico.    · Consuma una dieta saludable e incluya vegetales, frutas, pescado y carnes magras.    · Cumpla con todas las visitas de control, según le indique su médico.    · Asista a un grupo de apoyo con otras personas que sufren de dolor crónico.  SOLICITE ATENCIÓN MÉDICA SI:   · El dolor empeora.    · Siente un nuevo dolor, que no tenía antes.    · No tolera los medicamentos que le indicó el médico.    · Presenta nuevos síntomas desde la última consulta al médico.    SOLICITE ATENCIÓN MÉDICA DE INMEDIATO SI:   · Se siente débil.    · Tiene menos sensibilidad o siente adormecimientos.    · Pierde el control de la vejiga o del intestino.    · El dolor empeora repentinamente.    · Comienza a tener temblores.  · Comienza a sentir escalofríos.  · Se siente confundido.  · Siente dolor en el pecho.  · Le falta el aire.      Inc. ° °

## 2015-10-17 NOTE — ED Notes (Signed)
Pt reports she's been diagnosed with a brain tumor (noncancerous). Pt reports she was recently diagnosed with mercury in her urine; pt has neuropathy, reports increased weakness and numbness in body.

## 2015-10-17 NOTE — ED Notes (Signed)
Pt tolerating bp cuff and pulse oximeter with no apparent difficulty

## 2015-10-17 NOTE — ED Provider Notes (Signed)
[facility] Emergency Department Provider Note  ____________________________________________    I have reviewed the triage vital signs and the nursing notes.   HISTORY  Chief Complaint Generalized Body Aches    HPI Alice Simmons is a 60 y.o. female who presents with numerous complaints. She reports years of chronic pain but over the last several months has started developing electrical pains in her "entire body". She feels this is possibly related to having mercury in her urine. She also states that she is sensitive toelectrical devices and can't be near a cardiac monitor. She denies fevers or chills. No chest pain. No recent travel. She apparently sees Dr. Enid Derry. Patient is tearful and anxious     Past Medical History  Diagnosis Date  . Anxiety   . Obesity (BMI 30.0-34.9)   . Hypertension, benign   . Depression, controlled   . Pain of both breasts   . Diabetes mellitus without complication ([place])   . Obesity   . Hyperlipidemia   . Hypertension   . Insomnia   . Breast pain   . Depression   . 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 Arthritis 11/15/2012  . Fibromyalgia   . Brain tumor [facility])     Patient Active Problem List   Diagnosis Date Noted  . Toxic effect of mercury 10/15/2015  . Small fiber neuropathy ([place]) 10/10/2015  . Fatigue 10/10/2015  . Abnormal weight loss 10/10/2015  . Encounter for therapeutic drug level monitoring 10/10/2015  . Lymphocytosis 10/10/2015  . Elevated liver enzymes 10/10/2015  . H/O cosmetic surgery 08/14/2015  . Chronic lower extremity pain (Right) 07/18/2015  . Hypertension goal BP (blood pressure) < 140/90 06/05/2015  . Bilateral tinnitus 06/05/2015  . Left breast mass 06/05/2015  . Neurogenic pain 05/07/2015  . Musculoskeletal pain 05/07/2015  . Diffuse myofascial pain syndrome 05/07/2015  . Allodynia 05/07/2015  . 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 Marijuana abuse, continuous 04/25/2015  .  Fibromyalgia 04/20/2015  . Generalized Paresthesias 04/20/2015  . Paresthesias with subjective weakness 04/20/2015  . Chronic pain 04/20/2015  . Sensitivity to sunlight 04/20/2015  . Continuous illicit drug use [id number]  . Chronic low back pain (Location of Primary Source of Pain) (Bilateral) (R>L) 03/21/2015  . Chronic pain syndrome 03/21/2015  . Lumbar facet syndrome (Bilateral) (R>L) 03/21/2015  . Lumbar radicular pain 03/21/2015  . Lumbosacral radiculopathy 03/21/2015  . Lumbar spondylosis 03/21/2015  . Clinical depression 11/28/2014  . Diabetes mellitus type 2, controlled, without complications ([place]) [id number]  . Mass of left breast on mammogram 11/28/2014  . Back pain, thoracic 11/28/2014  . Hyperlipidemia LDL goal <100 11/28/2014  . Vitamin D deficiency 11/28/2014  . 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 Bipolar disorder, current episode depressed, severe, with psychotic features ([place]) 12/29/2013  . Self-inflicted injury [id number]  . Migraine headache 08/17/2013  . Chronic insomnia 08/17/2013  . Tobacco abuse 05/01/2013  . Apnea, sleep 03/02/2012  . Anxiety 03/04/2011    Past Surgical History  Procedure Laterality Date  . Abdominal hysterectomy    . Carpal tunnel release    . Breast surgery      benign breast cyst  . Cholecystectomy  2014  . Abdominal hysterectomy  2010  . Bilateral carpal tunnel release    . Breast biopsy Bilateral 1980    EXCISIONAL - NEG  . Breast biopsy Right 1985    CORE W/OUT CLIP - NEG  . Eye surgery  cataract surger both eyes    Current Outpatient Rx  Name  Route  Sig  Dispense  Refill  . amLODipine (NORVASC) 10 MG tablet      TAKE 1 TABLET BY MOUTH EVERY DAY   90 tablet   3   . clonazePAM (KLONOPIN) 0.5 MG tablet   Oral   Take 1 tablet (0.5 mg total) by mouth 2 (two) times daily as needed for anxiety.   30 tablet   5   . gabapentin (NEURONTIN) 400 MG capsule   Oral   Take 1 capsule (400 mg  total) by mouth 3 (three) times daily.   90 capsule   5   . labetalol (NORMODYNE) 300 MG tablet      TAKE 1 TABLET BY MOUTH TWICE DAILY   180 tablet   2   . losartan (COZAAR) 50 MG tablet   Oral   Take 1 tablet (50 mg total) by mouth daily.   30 tablet   2   . PARoxetine (PAXIL) 30 MG tablet   Oral   Take 1 tablet (30 mg total) by mouth 2 (two) times daily.   60 tablet   0   . topiramate (TOPAMAX) 100 MG tablet   Oral   Take 2 tablets (200 mg total) by mouth at bedtime.   60 tablet   5   . zolpidem (AMBIEN) 10 MG tablet      TAKE 1 TABLET BY MOUTH EVERY NIGHT AT BEDTIME Patient taking differently: 10 mg at bedtime as needed. TAKE 1 TABLET BY MOUTH EVERY NIGHT AT BEDTIME   30 tablet   0     Allergies Aspirin and Penicillins  Family History  Problem Relation Age of Onset  . Heart disease Mother   . Diabetes Father   . Hypertension Father   . Hyperlipidemia Sister   . Hyperlipidemia Brother   . Heart disease Brother   . Hyperlipidemia Brother   . Hyperlipidemia Brother   . Hyperlipidemia Brother   . Breast cancer Paternal Aunt   . Diabetes Mother   . Hypertension Mother     Social History Social History  Substance Use Topics  . Smoking status: Current Every Day Smoker -- 1.00 packs/day for 10 years    Types: Cigarettes  . Smokeless tobacco: Never Used  . Alcohol Use: No     Comment: occasional    Review of Systems  Constitutional: Negative for fever. Eyes: Negative for redness ENT: Negative for sore throat Cardiovascular: Negative for chest pain Respiratory: Negative for shortness of breath. Gastrointestinal: Negative for abdominal pain Genitourinary: "Mercury in my urine" Musculoskeletal: Chronic back pain Skin: Negative for rash. Neurological: Negative for focal weakness Psychiatric: Severe anxiety    ____________________________________________   PHYSICAL EXAM:  VITAL SIGNS: ED Triage Vitals  Enc Vitals Group     BP 10/17/15  0733 144/89 mmHg     Pulse Rate 10/17/15 0733 55     Resp 10/17/15 0733 24     Temp 10/17/15 0733 98.1 F (36.7 C)     Temp Source 10/17/15 0733 Oral     SpO2 10/17/15 0733 98 %     Weight 10/17/15 0733 130 lb (58.968 kg)     Height --      Head Cir --      Peak Flow --      Pain Score 10/17/15 0733 10     Pain Loc --      Pain Edu? --  Excl. in GC? --      Constitutional: Tearful and anxious Eyes: Conjunctivae are normal. No erythema or injection ENT   Head: Normocephalic and atraumatic.   Mouth/Throat: Mucous membranes are moist. Cardiovascular: Mild bradycardia, regular rhythm. Normal and symmetric distal pulses are present in the upper extremities.  Respiratory: Normal respiratory effort without tachypnea nor retractions. Breath sounds are clear and equal bilaterally.  Gastrointestinal: Soft and non-tender in all quadrants. No distention. There is no CVA tenderness. Genitourinary: deferred Musculoskeletal: Nontender with normal range of motion in all extremities. No lower extremity tenderness nor edema. Neurologic:  Normal speech and language. No gross focal neurologic deficits are appreciated. Skin:  Skin is warm, dry and intact. No rash noted. Psychiatric: Patient with bizarre, dramatic affect. Severe anxiety  ____________________________________________    LABS (pertinent positives/negatives)  Labs Reviewed  CBC  COMPREHENSIVE METABOLIC PANEL  URINALYSIS COMPLETEWITH MICROSCOPIC ([place])    ____________________________________________   EKG  ED ECG REPORT I, Lavonia Drafts, the attending physician, personally viewed and interpreted this ECG.  Date: 10/17/2015 EKG Time: 8:03 AM Rate: 52 Rhythm: Sinus bradycardia QRS Axis: normal Intervals: normal ST/T Wave abnormalities: normal Conduction Disturbances: none Narrative Interpretation: unremarkable   ____________________________________________     RADIOLOGY  None  ____________________________________________   PROCEDURES  Procedure(s) performed: none  Critical Care performed: none  ____________________________________________   INITIAL IMPRESSION / ASSESSMENT AND PLAN / ED COURSE  Pertinent labs & imaging results that were available during my care of the patient were reviewed by me and considered in my medical decision making (see chart for details).  Reviewed her notes in the medical record. Had been seeing Dr. Dossie Arbour of pain management but was dismissed from his practice given bizarre behavior suspected to be psychiatric in nature. Patient did have Mercury and arsenic level sent by Dr. Sanda Klein but these were not significantly elevated and it is unclear if they are clinically significant. The patient is to do a 24-hour urine collection for further evaluation.  My impression is that her complaints are related to anxiety/possible undiagnosed psychiatric condition. We will check labs, EKG and urine and reevaluate.  ----------------------------------------- 9:33 AM on 10/17/2015 -----------------------------------------  Lab work is unremarkable. Vital signs are unremarkable. Exam is unremarkable. Discussed with patient the need for further follow-up with her PCP who is evaluating her thoroughly. She is very upset with me and now wants the IV removed so that she can leave immediately. Discharge is appropriate at this point  ____________________________________________   FINAL CLINICAL IMPRESSION(S) / ED DIAGNOSES  Final diagnoses:  Chronic pain  Anxiety          Lavonia Drafts, MD 10/17/15 [phone]

## 2015-10-17 NOTE — ED Notes (Addendum)
Pt presents with shivering and c/o being very cold. Pt states she has "pins and needles" pain "all over her body." States she has had back spasms for a year and that she has recently begun having this pain. Pt states she has been undergoing tests to determine what is going on with Dr. Sanda Klein. Pt states that she cannot be around electrical items as they cause more pain (clothes dryer,, microwave oven, etc.)

## 2015-10-17 NOTE — ED Notes (Signed)
Pt discharged home after verbalizing understanding of discharge instructions; nad noted. Pt pulled IV out without assistance from nurse. Pt doesn't know what she can do, states she can't take any medications.

## 2015-10-22 ENCOUNTER — Ambulatory Visit (INDEPENDENT_AMBULATORY_CARE_PROVIDER_SITE_OTHER): Payer: Medicare Other | Admitting: Family Medicine

## 2015-10-22 ENCOUNTER — Encounter: Payer: Self-pay | Admitting: Family Medicine

## 2015-10-22 VITALS — BP 138/74 | HR 70 | Temp 98.7°F | Resp 14 | Wt 129.0 lb

## 2015-10-22 DIAGNOSIS — F32A Depression, unspecified: Secondary | ICD-10-CM

## 2015-10-22 DIAGNOSIS — T561X1A Toxic effect of mercury and its compounds, accidental (unintentional), initial encounter: Secondary | ICD-10-CM | POA: Diagnosis not present

## 2015-10-22 DIAGNOSIS — D72820 Lymphocytosis (symptomatic): Secondary | ICD-10-CM | POA: Diagnosis not present

## 2015-10-22 DIAGNOSIS — R462 Strange and inexplicable behavior: Secondary | ICD-10-CM

## 2015-10-22 DIAGNOSIS — R748 Abnormal levels of other serum enzymes: Secondary | ICD-10-CM

## 2015-10-22 DIAGNOSIS — R634 Abnormal weight loss: Secondary | ICD-10-CM

## 2015-10-22 DIAGNOSIS — H9313 Tinnitus, bilateral: Secondary | ICD-10-CM

## 2015-10-22 DIAGNOSIS — F329 Major depressive disorder, single episode, unspecified: Secondary | ICD-10-CM | POA: Diagnosis not present

## 2015-10-22 NOTE — Progress Notes (Signed)
BP 138/74 mmHg  Pulse 70  Temp(Src) 98.7 F (37.1 C) (Oral)  Resp 14  Wt 129 lb (58.514 kg)  SpO2 98%  LMP  (Approximate)   Subjective:    Patient ID: Alice Simmons, female    DOB: [birth year], 60 y.o.   MRN: [id number]  HPI: Alice Simmons is a 60 y.o. female  Chief Complaint  Patient presents with  . Follow-up    2 week   Patient is here for f/u; multiple complaints She had tests (nerve conduction tests it sounds like) in the hospital and it touched one nerve in the posterior right calf; when she crosses her legs, she has the patella hit the calf and it's like a release, a good release When he was doing the test, she started to scream and it was like a shock; muscles were twitching afterwards; leg hurt afterwards She has noise in the ear, ringing in the left ear; when nerves are bad Feels twitches in the muscles, arms and face She also feels pain along the right hip; she went to the ER We talked about the note saying that psychiatric reasons might be responsible There is stress at home; no physical violence She stopped taking xanax and a lot of the symptoms started; personality and moodiness started; she does not use clonazepam very often, just for anxiety attacks She is not appreciated, does not feel loved, but says she is not in danger; sleep in separate bedrooms; she is not in counseling now, but has done it before Not eating any fish We reviewed her labs in detail  Depression screen [facility] 2/9 11/12/2015 08/14/2015 07/18/2015 06/05/2015 05/30/2015  Decreased Interest 0 0 0 0 0  Down, Depressed, Hopeless 0 1 0 0 0  PHQ - 2 Score 0 1 0 0 0  Altered sleeping - - - - -  Tired, decreased energy - - - - -  Change in appetite - - - - -  Feeling bad or failure about yourself  - - - - -  Trouble concentrating - - - - -  Moving slowly or fidgety/restless - - - - -  Suicidal thoughts - - - - -  PHQ-9 Score - - - - -  Difficult doing work/chores - - - - -   Relevant past  medical, surgical, family and social history reviewed Past Medical History  Diagnosis Date  . Anxiety   . Obesity (BMI 30.0-34.9)   . Hypertension, benign   . Depression, controlled   . Pain of both breasts   . Diabetes mellitus without complication ([place])   . Obesity   . Hyperlipidemia   . Hypertension   . Insomnia   . Breast pain   . Depression   . 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 Arthritis 11/15/2012  . Fibromyalgia   . Brain tumor [facility])    Past Surgical History  Procedure Laterality Date  . Abdominal hysterectomy    . Carpal tunnel release    . Breast surgery      benign breast cyst  . Cholecystectomy  2014  . Abdominal hysterectomy  2010  . Bilateral carpal tunnel release    . Breast biopsy Bilateral 1980    EXCISIONAL - NEG  . Breast biopsy Right 1985    CORE W/OUT CLIP - NEG  . Eye surgery      cataract surger both eyes   Family History  Problem Relation Age of Onset  . Heart disease Mother   .  Diabetes Father   . Hypertension Father   . Hyperlipidemia Sister   . Hyperlipidemia Brother   . Heart disease Brother   . Hyperlipidemia Brother   . Hyperlipidemia Brother   . Hyperlipidemia Brother   . Breast cancer Paternal Aunt   . Diabetes Mother   . Hypertension Mother    Social History  Substance Use Topics  . Smoking status: Current Every Day Smoker -- 1.00 packs/day for 10 years    Types: Cigarettes  . Smokeless tobacco: Never Used  . Alcohol Use: No     Comment: occasional   Interim medical history since last visit reviewed. Allergies and medications reviewed  Review of Systems Per HPI unless specifically indicated above     Objective:    BP 138/74 mmHg  Pulse 70  Temp(Src) 98.7 F (37.1 C) (Oral)  Resp 14  Wt 129 lb (58.514 kg)  SpO2 98%  LMP  (Approximate)  Wt Readings from Last 3 Encounters:  11/12/15 132 lb (59.875 kg)  10/22/15 129 lb (58.514 kg)  10/17/15 130 lb (58.968 kg)    Physical Exam  Constitutional: She appears  well-developed and well-nourished.  Hispanic female; no distress  HENT:  Mouth/Throat: Oropharynx is clear and moist.  Eyes: No scleral icterus.  Neck: No JVD present.  Cardiovascular: Normal rate and regular rhythm.   Pulmonary/Chest: Effort normal and breath sounds normal.  Abdominal: Soft. Bowel sounds are normal. She exhibits no distension.  Musculoskeletal: She exhibits no edema.  Neurological: She is alert. She displays no atrophy. She exhibits normal muscle tone. Gait normal.  Twitches and fasciculations seen in the face and arms; ambulatory without assistance  Skin:  Pallor of nails distally and proximal erythema along cuticles  Psychiatric:  Emotionally labile, briefly tearful; good eye contact with examiner though    Results for orders placed or performed during the hospital encounter of 10/17/15  CBC  Result Value Ref Range   WBC 8.5 3.6 - 11.0 K/uL   RBC 4.21 3.80 - 5.20 MIL/uL   Hemoglobin 13.0 12.0 - 16.0 g/dL   HCT 38.4 35.0 - 47.0 %   MCV 91.1 80.0 - 100.0 fL   MCH 30.9 26.0 - 34.0 pg   MCHC 33.9 32.0 - 36.0 g/dL   RDW 13.8 11.5 - 14.5 %   Platelets 313 150 - 440 K/uL  Comprehensive metabolic panel  Result Value Ref Range   Sodium 136 135 - 145 mmol/L   Potassium 3.9 3.5 - 5.1 mmol/L   Chloride 106 101 - 111 mmol/L   CO2 23 22 - 32 mmol/L   Glucose, Bld 102 (H) 65 - 99 mg/dL   BUN 19 6 - 20 mg/dL   Creatinine, Ser 0.82 0.44 - 1.00 mg/dL   Calcium 9.0 8.9 - 10.3 mg/dL   Total Protein 7.1 6.5 - 8.1 g/dL   Albumin 4.3 3.5 - 5.0 g/dL   AST 23 15 - 41 U/L   ALT 17 14 - 54 U/L   Alkaline Phosphatase 63 38 - 126 U/L   Total Bilirubin 0.3 0.3 - 1.2 mg/dL   GFR calc non Af Amer >60 >60 mL/min   GFR calc Af Amer >60 >60 mL/min   Anion gap 7 5 - 15  Urinalysis complete, with microscopic ([facility] only)  Result Value Ref Range   Color, Urine STRAW (A) YELLOW   APPearance CLEAR (A) CLEAR   Glucose, UA NEGATIVE NEGATIVE mg/dL   Bilirubin Urine NEGATIVE NEGATIVE    Ketones, ur  NEGATIVE NEGATIVE mg/dL   Specific Gravity, Urine 1.002 (L) 1.005 - 1.030   Hgb urine dipstick NEGATIVE NEGATIVE   pH 8.0 5.0 - 8.0   Protein, ur NEGATIVE NEGATIVE mg/dL   Nitrite NEGATIVE NEGATIVE   Leukocytes, UA NEGATIVE NEGATIVE   RBC / HPF NONE SEEN 0 - 5 RBC/hpf   WBC, UA NONE SEEN 0 - 5 WBC/hpf   Bacteria, UA NONE SEEN NONE SEEN   Squamous Epithelial / LPF 0-5 (A) NONE SEEN      Assessment & Plan:   Problem List Items Addressed This Visit      Other   Abnormal weight loss    Last TSH normal      Bilateral tinnitus    Saw ENT      Clinical depression    Hx of psychosis with meds; refer to psych      Relevant Orders   Ambulatory referral to Psychiatry   Elevated liver enzymes    Back to normal      Lymphocytosis    Back to normal      Toxic effect of mercury - Primary    Recheck 24 hour urine for mercury and arsenic       Other Visit Diagnoses    Bizarre behavior        Relevant Orders    Ambulatory referral to Psychiatry       Follow up plan: Return in about 3 weeks (around 11/12/2015) for follow-up.  An after-visit summary was printed and given to the patient at [place].  Please see the patient instructions which may contain other information and recommendations beyond what is mentioned above in the assessment and plan.  No orders of the defined types were placed in this encounter.    Orders Placed This Encounter  Procedures  . Ambulatory referral to Psychiatry  Face-to-face time with patient was more than 25 minutes, >50% time spent counseling and coordination of care

## 2015-10-22 NOTE — Assessment & Plan Note (Signed)
Recheck 24 hour urine for mercury and arsenic

## 2015-10-22 NOTE — Assessment & Plan Note (Signed)
Hx of psychosis with meds; refer to psych

## 2015-10-22 NOTE — Assessment & Plan Note (Signed)
Saw ENT

## 2015-10-22 NOTE — Assessment & Plan Note (Signed)
Back to normal 

## 2015-10-22 NOTE — Assessment & Plan Note (Signed)
Last TSH normal.  °

## 2015-10-22 NOTE — Patient Instructions (Addendum)
Please do the 24 hour urine collection Please do see psychiatrist and neurologist Try yoga or other stretching Return in 3 weeks

## 2015-10-26 ENCOUNTER — Other Ambulatory Visit
Admission: RE | Admit: 2015-10-26 | Discharge: 2015-10-26 | Disposition: A | Discharge status: Home / Self Care | Payer: Medicare Other | Source: Ambulatory Visit | Attending: Family Medicine | Admitting: Family Medicine

## 2015-10-26 DIAGNOSIS — G629 Polyneuropathy, unspecified: Secondary | ICD-10-CM | POA: Insufficient documentation

## 2015-10-26 DIAGNOSIS — T561X1A Toxic effect of mercury and its compounds, accidental (unintentional), initial encounter: Secondary | ICD-10-CM | POA: Insufficient documentation

## 2015-10-28 LAB — HEAVY METALS PROFILE, URINE
Arsenic (Total),U: NOT DETECTED ug/L (ref 0–50)
Arsenic(Inorganic),U: NOT DETECTED ug/L (ref 0–19)
Creatinine(Crt),U: 0.34 g/L (ref 0.30–3.00)
LEAD RANDOM URINE: NOT DETECTED ug/L (ref 0–49)
Mercury, Ur: NOT DETECTED ug/L (ref 0–19)
TOTAL VOLUME: 2600

## 2015-11-12 ENCOUNTER — Ambulatory Visit (INDEPENDENT_AMBULATORY_CARE_PROVIDER_SITE_OTHER): Payer: Medicare Other | Admitting: Family Medicine

## 2015-11-12 ENCOUNTER — Encounter: Payer: Self-pay | Admitting: Family Medicine

## 2015-11-12 ENCOUNTER — Other Ambulatory Visit: Payer: Self-pay | Admitting: Family Medicine

## 2015-11-12 VITALS — BP 122/66 | HR 64 | Temp 98.4°F | Resp 14 | Wt 132.0 lb

## 2015-11-12 DIAGNOSIS — G894 Chronic pain syndrome: Secondary | ICD-10-CM

## 2015-11-12 DIAGNOSIS — R531 Weakness: Secondary | ICD-10-CM

## 2015-11-12 DIAGNOSIS — R202 Paresthesia of skin: Secondary | ICD-10-CM | POA: Diagnosis not present

## 2015-11-12 DIAGNOSIS — M792 Neuralgia and neuritis, unspecified: Secondary | ICD-10-CM

## 2015-11-12 DIAGNOSIS — F419 Anxiety disorder, unspecified: Secondary | ICD-10-CM | POA: Diagnosis not present

## 2015-11-12 MED ORDER — GABAPENTIN 600 MG PO TABS: 600.0000 mg | ORAL_TABLET | Freq: Three times a day (TID) | ORAL | Status: DC

## 2015-11-12 NOTE — Progress Notes (Signed)
BP 122/66 mmHg  Pulse 64  Temp(Src) 98.4 F (36.9 C) (Oral)  Resp 14  Wt 132 lb (59.875 kg)  SpO2 98%  LMP  (Approximate)   Subjective:    Patient ID: Alice Simmons, female    DOB: 06-15-1956, 60 y.o.   MRN: [id number]  HPI: Alice Simmons is a 60 y.o. female  Chief Complaint  Patient presents with  . Follow-up    3 weeks   Patient is here for f/u of neuropathy and multiple somatic complaints She had the urine test redone She feels better when sitting with right hip in flexion, pressing the left knee into the calf She feels everything in her body She wants to live normal life The sun bothers her, like needles in her skin, right side Pain goes from her leg and up to the back and to the side/flank; that has been bothering her since having EMG done She had ringing in the ears, [facility] neurologist ordered nerve tests She is still having fasciculations She wants to go to the store; her symptoms are keeping her from enjoying life and getting out and doing things, though Weakness on the right side; feels so dizzy and so tired Neurologist did MRI of the brain, ringing in the ears  Implant in the right tooth, implant goes to the sinus; 10 years ago; some drainage; really sore over the sciatica Pain in the right calf; right buttock, right hip and right upper back Her right side of the face along mandible, she controls the cheeks with the right calf, feels relief with rubbing the calf against the left knee; she feels the pain in the right calf and all that happened after the EMG  She c/o lower back and they were giving her shots in the lower back, not higher   Depression screen [facility] 2/9 11/12/2015 08/14/2015 07/18/2015 06/05/2015 05/30/2015  Decreased Interest 0 0 0 0 0  Down, Depressed, Hopeless 0 1 0 0 0  PHQ - 2 Score 0 1 0 0 0  Altered sleeping - - - - -  Tired, decreased energy - - - - -  Change in appetite - - - - -  Feeling bad or failure about yourself  - - - - -    Trouble concentrating - - - - -  Moving slowly or fidgety/restless - - - - -  Suicidal thoughts - - - - -  PHQ-9 Score - - - - -  Difficult doing work/chores - - - - -   Relevant past medical, surgical, family and social history reviewed Past Medical History  Diagnosis Date  . Anxiety   . Obesity (BMI 30.0-34.9)   . Hypertension, benign   . Depression, controlled   . Pain of both breasts   . Diabetes mellitus without complication ([place])   . Obesity   . Hyperlipidemia   . Hypertension   . Insomnia   . Breast pain   . Depression   . 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 Arthritis 11/15/2012  . Fibromyalgia   . Brain tumor [facility])    Past Surgical History  Procedure Laterality Date  . Abdominal hysterectomy    . Carpal tunnel release    . Breast surgery      benign breast cyst  . Cholecystectomy  2014  . Abdominal hysterectomy  2010  . Bilateral carpal tunnel release    . Breast biopsy Bilateral 1980    EXCISIONAL - NEG  . Breast biopsy Right  1985    CORE W/OUT CLIP - NEG  . Eye surgery      cataract surger both eyes   Interim medical history since last visit reviewed. Allergies and medications reviewed  Review of Systems Per HPI unless specifically indicated above     Objective:    BP 122/66 mmHg  Pulse 64  Temp(Src) 98.4 F (36.9 C) (Oral)  Resp 14  Wt 132 lb (59.875 kg)  SpO2 98%  LMP  (Approximate)  Wt Readings from Last 3 Encounters:  12/04/15 131 lb 6.4 oz (59.603 kg)  11/12/15 132 lb (59.875 kg)  10/22/15 129 lb (58.514 kg)    Physical Exam  Constitutional: She appears well-developed and well-nourished.  Hispanic female; no distress  HENT:  Mouth/Throat: Oropharynx is clear and moist.  Cardiovascular: Normal rate and regular rhythm.   Pulmonary/Chest: Effort normal and breath sounds normal.  Musculoskeletal: She exhibits no edema.  Neurological: She is alert. She displays no atrophy. She exhibits normal muscle tone. Gait normal.  Twitches and  fasciculations seen in the face but seems to come and go with her level of emotional state; ambulatory without assistance  Skin:  Pallor of nails distally and proximal erythema along cuticles  Psychiatric: Her mood appears anxious. Her affect is labile. Cognition and memory are not impaired. She exhibits a depressed mood.  Emotionally labile, briefly tearful; good eye contact with examiner though    Results for orders placed or performed during the hospital encounter of 10/26/15  Heavy Metals Profile, Urine  Result Value Ref Range   Creatinine(Crt),U 0.34 0.30 - 3.00 g/L   Arsenic (Total),U None Detected 0 - 50 ug/L   Arsenic(Inorganic),U None Detected 0 - 19 ug/L   Lead, Rand Ur None Detected 0 - 49 ug/L   Mercury, Ur None Detected 0 - 19 ug/L   Total Volume 2600       Assessment & Plan:   Problem List Items Addressed This Visit      Other   Paresthesias with subjective weakness (Chronic)    Refer to neuro      Relevant Orders   Ambulatory referral to Neurology   Neurogenic pain - Primary (Chronic)    Refer to neuro      Relevant Orders   Ambulatory referral to Neurology   Chronic pain syndrome (Chronic)    With neurogenic pain; will taper up the gabapentin while awaiting appointment with neurologist; she has been dismissed by the pain clinic; she has not been accepting of seeing psychiatry to help evaluate and treat her emotional component of pain and anxiety      Anxiety    I put in a referral to psychiatry earlier in the month, because there appears to be significant overlay of psychiatric involvement or causation to her symptoms; encouraged her to go          Follow up plan: No Follow-up on file.  An after-visit summary was printed and given to the patient at [place].  Please see the patient instructions which may contain other information and recommendations beyond what is mentioned above in the assessment and plan.  Meds ordered this encounter  Medications   . gabapentin (NEURONTIN) 600 MG tablet    Sig: Take 1 tablet (600 mg total) by mouth 3 (three) times daily. (never stop abruptly)    Dispense:  90 tablet    Refill:  5    Orders Placed This Encounter  Procedures  . Ambulatory referral to Neurology

## 2015-11-12 NOTE — Assessment & Plan Note (Signed)
Refer to neuro 

## 2015-11-12 NOTE — Telephone Encounter (Signed)
I'm going to sign off on this note; I have already received the follow-up testing

## 2015-11-12 NOTE — Patient Instructions (Addendum)
Tuesday May 30th:  400 mg in the morning, 400 mg in the early afternoon, 600 mg at night Wednesday May 31st: same Thursday June 1st: same  Friday June 2nd: 600 mg in the morning, 400 mg in the early afternoon, 600 mg at night Saturday June 3rd: same Sunday June 4th: same  Monday June 5th and onward: 600 mg in the morning, 600 mg in the early afternoon, 600 mg at night  -------------------------  We'll ask the [facility] staff to schedule an appointment with you  Keep me posted by phone how you are doing

## 2015-11-21 ENCOUNTER — Telehealth: Payer: Self-pay | Admitting: Family Medicine

## 2015-11-21 DIAGNOSIS — R531 Weakness: Secondary | ICD-10-CM

## 2015-11-21 DIAGNOSIS — G629 Polyneuropathy, unspecified: Secondary | ICD-10-CM

## 2015-11-21 DIAGNOSIS — R202 Paresthesia of skin: Secondary | ICD-10-CM

## 2015-11-21 DIAGNOSIS — R253 Fasciculation: Secondary | ICD-10-CM

## 2015-11-21 NOTE — Assessment & Plan Note (Signed)
Refer to neuro 

## 2015-11-21 NOTE — Telephone Encounter (Signed)
FYI this is going to be the 3rd time I have referred her! 1st was [facility] where they wanted to go then [facility] where she had been before and now [facility].  Please place new order if you want her to go to [facility]

## 2015-11-21 NOTE — Telephone Encounter (Signed)
Pts husband called about referral to Neurologist and states they cannot get his wife in until September and pt is requesting to go somewhere else in [place].Please advise.

## 2015-11-21 NOTE — Telephone Encounter (Signed)
New referral entered 

## 2015-11-27 ENCOUNTER — Telehealth: Payer: Self-pay | Admitting: Family Medicine

## 2015-11-27 DIAGNOSIS — N6320 Unspecified lump in the left breast, unspecified quadrant: Secondary | ICD-10-CM

## 2015-11-27 NOTE — Telephone Encounter (Signed)
Chart reviewed; last imaging reports from Jan 2017 reviewed; all that is recommended by radiologist appears to be left breast US to be done in July 2017

## 2015-11-27 NOTE — Assessment & Plan Note (Signed)
Due for final breast US of left breast around January 05, 2016

## 2015-11-27 NOTE — Telephone Encounter (Signed)
Pt needs a referral to get a mammogram. Pt has had abnormal in the past.

## 2015-12-04 ENCOUNTER — Encounter: Payer: Self-pay | Admitting: Neurology

## 2015-12-04 ENCOUNTER — Ambulatory Visit (INDEPENDENT_AMBULATORY_CARE_PROVIDER_SITE_OTHER): Payer: Medicare Other | Admitting: Neurology

## 2015-12-04 ENCOUNTER — Other Ambulatory Visit: Payer: Self-pay

## 2015-12-04 VITALS — BP 125/68 | HR 54 | Ht 60.0 in | Wt 131.4 lb

## 2015-12-04 DIAGNOSIS — M545 Low back pain, unspecified: Secondary | ICD-10-CM

## 2015-12-04 DIAGNOSIS — M791 Myalgia, unspecified site: Secondary | ICD-10-CM

## 2015-12-04 DIAGNOSIS — R202 Paresthesia of skin: Secondary | ICD-10-CM | POA: Diagnosis not present

## 2015-12-04 DIAGNOSIS — G8929 Other chronic pain: Secondary | ICD-10-CM | POA: Diagnosis not present

## 2015-12-04 DIAGNOSIS — R29818 Other symptoms and signs involving the nervous system: Secondary | ICD-10-CM

## 2015-12-04 DIAGNOSIS — M792 Neuralgia and neuritis, unspecified: Secondary | ICD-10-CM

## 2015-12-04 DIAGNOSIS — G4489 Other headache syndrome: Secondary | ICD-10-CM | POA: Diagnosis not present

## 2015-12-04 DIAGNOSIS — G988 Other disorders of nervous system: Secondary | ICD-10-CM

## 2015-12-04 NOTE — Patient Instructions (Signed)
Remember to drink plenty of fluid, eat healthy meals and do not skip any meals. Try to eat protein with a every meal and eat a healthy snack such as fruit or nuts in between meals. Try to keep a regular sleep-wake schedule and try to exercise daily, particularly in the form of walking, 20-30 minutes a day, if you can.   As far as diagnostic testing: I will review all records from [facility]  Our phone number is [phone]. We also have an after hours call service for urgent matters and there is a physician on-call for urgent questions. For any emergencies you know to call 911 or go to the nearest emergency room

## 2015-12-04 NOTE — Progress Notes (Addendum)
W[id number]    Provider:  Dr Jaynee Eagles Referring Provider: Arnetha Courser, MD Primary Care Physician:  Enid Derry, MD  CC:  Neuropathy  HPI:  Alice Simmons is a 60 y.o. female here as a referral from Dr. Sanda Klein for neuropathy. Past medical history anxiety, obesity, hypertension, depression, chronic pain, diabetes, hyperlipidemia, insomnia, fibromyalgia, bipolar disorder with acute psychosis, chronic pain including chronic low back pain, history of self-inflicted pain,  insomnia, paresthesias and subjective weakness, neuropathic pain, musculoskeletal pain, diffuse myofascial pain syndrome.  Started with ringing in the ears. Left side of the face. Then she started having tingling, She had an emg/ncs on all 4 limbs. She saw [facility] neurology. She said she started screaming when he did the NCS on her leg. Since then her nerves have been jumping. All on the right side she has tingling, itching, sweating, fatigue, the arm is tingling, hair is tinglin, she passes out, she circled almost everything on the ROS form. She can't clean because her body hurts all over with severe muscle spasms, electric shocks throughout her body, nerves jumping, muscles jumping, fatigue, insomnia. [facility] neurology did a multitude of tests and referred her to a pain clinic. She had labs, epidural steroid injections, MRIs. Marland KitchenShe was eventually told her symptoms are psychological. Symptoms are is every day, all the time, continuous, severe, feels like her muscles are sore, she screams with pain all the time. Gabapentin did not help. She describes no new medications, no other inciting events or head trauma. She has back pain and neck pain and hip pain and leg pain and headache. She has chills, the hair on the right arm stands up. She feels generally weak all over. No focal weakness. Patient's pointing to multiple locations on her body and complaining of severe pain, difficult to continue documenting all her symptoms  because there are so many d and ifficult to type fast enough.  Reviewed notes, labs and imaging from outside physicians, which showed:  Personally reviewed images and agree with the following:  Reviewed notes in EPIC: June 2015 patient apparently reported seizures which were thought to be stress related, in that same month she was brought to the emergency room after she stopped result in the belly trying to cut fat out, side ideation, depression. She was seen in the emergency room in March of this year for headache and back pain, piercing these areas, and other multiple complaints including electrical pains all over her body at different points in time, in March 2017 she was seen by pain management screaming that she was having severe spasms in her back, crying but upon evaluation there were no spasms in the lower back and nothing to explain her symptoms, suspicion that this is all psychiatric. In April 2017 she was seen in outpatient complaining of multiple things including face drooping, muscle contractions, feeling full, excessive sweating, fatigue, and other problems. She was seen again in the emergency room in May with paresthesias all over her body, back spasms, and then seen a few more times outpatient in May with similar symptoms.  Reviewed labs including heavy metal profile showed arsenic was elevated repeat was normal, CBC normal, CMP essentially normal, SPEP normal, ceruloplasmin normal, B12 normal, folate normal, vitamin D abnormal at 23.3, CRP normal, RPR normal, hemoglobin A1c normal, TSH normal  MRI of the brain March 2016:  Initial brain images demonstrate no acute infarct, hemorrhage, or mass lesion. There is no significant white matter disease. The ventricles are of normal size.  No significant extra-axial fluid collection is present.   Flow is present in the major intracranial arteries. Minimal fluid is present in the mastoid air cells bilaterally. The paranasal sinuses are  otherwise clear. The globes and orbits are intact.   Dedicated imaging of the internal auditory canals demonstrates no pathologic enhancement. There is no mass lesion.   The right AICA extends across the porous acoustic is adjacent to the seventh and eighth cranial nerves. Visualized cranial nerves are otherwise unremarkable.   Postcontrast imaging through the remainder the brain is within normal limits.   IMPRESSION:  1. No acute or focal lesion to explain the patient's symptoms.  2. 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 This is of unknown clinical significance with some controversy over potential implications related to tinnitus.   MRI of the lumbar spine 12/03/2014:  FINDINGS: The lowest lumbar type non-rib-bearing vertebra is labeled as L5. The conus medullaris appears normal. Conus level: L1.  There is 3 mm degenerative anterolisthesis at L4-5. No pars defectsobserved.  Minimal disc desiccation noted in the lumbar spine.  No significant vertebral marrow edema is identified. Additional findings at individual levels are as follows:  L1-2: Unremarkable.  L2-3: Borderline central narrowing of the thecal sac due to teeth diffuse disc bulge.  L3-4: Borderline central narrowing of the thecal sac due to diffuse disc bulge.  L4-5: Moderate central narrowing of the thecal sac with mild bilateral foraminal stenosis and mild bilateral subarticular lateral recess stenosis due to disc uncovering, disc bulge, and facet arthropathy.  L5-S1: Mild left foraminal stenosis due to facet arthropathy.  IMPRESSION: 1. Lumbar spondylosis and degenerative disc disease, causing moderate impingement at L4-5 and mild impingement at L5-S1, as detailed above. Findings are only very minimally progressive compared to 04/08/12.  Review of Systems: Patient complains of symptoms per HPI as well as the following symptoms: Activity change in  appetite change chills fatigue light sensitivity or vision, loss of vision, eye pain, facial swelling, hearing loss, ear pain, ringing in ears, trouble swallowing, cough, shortness of breath, chest pain, leg swelling, cold intolerance, heat intolerance, excessive thirst, abdominal pain, constipation, diarrhea, restless legs, insomnia, frequent awakening, daytime sleepiness, difficulty urinating, incontinence, frequency of urination, joint pain, joint swelling, back pain, aching muscles, muscle cramps, walking difficulty, neck pain, neck stiffness, memory loss, dizziness, headache, numbness, speech difficulty, weakness, tremors, anxiety,, depression. Pertinent negatives per HPI. All others negative.   Social History   Social History  . Marital Status: Married    Spouse Name: Hessie Diener  . Number of Children: 2  . Years of Education: 12   Occupational History  . Unemployed    Social History Main Topics  . Smoking status: Current Every Day Smoker -- 1.00 packs/day for 10 years    Types: Cigarettes  . Smokeless tobacco: Never Used  . Alcohol Use: No     Comment: occasional  . Drug Use: No     Comment: smokes MJ 2 times per day  . Sexual Activity: Not Currently   Other Topics Concern  . Not on file   Social History Narrative   Lives alone   Caffeine use: Drinks coffee (3 cups per day)   ** Merged History Encounter **        Family History  Problem Relation Age of Onset  . Heart disease Mother   . Diabetes Mother   . Hypertension Mother   . Diabetes Father   . Hypertension Father   .  Hyperlipidemia Sister   . Hyperlipidemia Brother   . Heart disease Brother   . Hyperlipidemia Brother   . Hyperlipidemia Brother   . Hyperlipidemia Brother   . Breast cancer Paternal Aunt   . Neuropathy Neg Hx     Past Medical History  Diagnosis Date  . Anxiety   . Obesity (BMI 30.0-34.9)   . Hypertension, benign   . Depression, controlled   . Pain of both breasts   . Diabetes  mellitus without complication ([place])   . Obesity   . Hyperlipidemia   . Hypertension   . Insomnia   . Breast pain   . Depression   . 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 Arthritis 11/15/2012  . Fibromyalgia   . Brain tumor [facility])     Past Surgical History  Procedure Laterality Date  . Abdominal hysterectomy    . Carpal tunnel release    . Breast surgery      benign breast cyst  . Cholecystectomy  2014  . Abdominal hysterectomy  2010  . Bilateral carpal tunnel release    . Breast biopsy Bilateral 1980    EXCISIONAL - NEG  . Breast biopsy Right 1985    CORE W/OUT CLIP - NEG  . Eye surgery      cataract surger both eyes    Current Outpatient Prescriptions  Medication Sig Dispense Refill  . amLODipine (NORVASC) 10 MG tablet TAKE 1 TABLET BY MOUTH EVERY DAY 90 tablet 3  . clonazePAM (KLONOPIN) 0.5 MG tablet Take 1 tablet (0.5 mg total) by mouth 2 (two) times daily as needed for anxiety. 30 tablet 5  . gabapentin (NEURONTIN) 600 MG tablet Take 1 tablet (600 mg total) by mouth 3 (three) times daily. (never stop abruptly) 90 tablet 5  . labetalol (NORMODYNE) 300 MG tablet TAKE 1 TABLET BY MOUTH TWICE DAILY 180 tablet 2  . losartan (COZAAR) 50 MG tablet Take 1 tablet (50 mg total) by mouth daily. 30 tablet 2  . PARoxetine (PAXIL) 30 MG tablet Take 1 tablet (30 mg total) by mouth 2 (two) times daily. 60 tablet 0  . topiramate (TOPAMAX) 100 MG tablet Take 2 tablets (200 mg total) by mouth at bedtime. 60 tablet 5  . zolpidem (AMBIEN) 5 MG tablet Take 1 tablet (5 mg total) by mouth at bedtime as needed for sleep. 15 pills must last at least 30+ days 15 tablet 0   No current facility-administered medications for this visit.    Allergies as of 12/04/2015 - Review Complete 12/04/2015  Allergen Reaction Noted  . Aspirin Swelling 03/04/2011  . Penicillins Swelling 03/04/2011    Vitals: BP 125/68 mmHg  Pulse 54  Ht 5' (1.524 m)  Wt 131 lb 6.4 oz (59.603 kg)  BMI 25.66 kg/m2  LMP   (Approximate) Last Weight:  Wt Readings from Last 1 Encounters:  12/04/15 131 lb 6.4 oz (59.603 kg)   Last Height:   Ht Readings from Last 1 Encounters:  12/04/15 5' (1.524 m)   Physical exam: Exam: [id number] she is in severe pain                    CV: RRR, no MRG. No Carotid Bruits. No peripheral edema, warm, nontender Eyes: Conjunctivae clear without exudates or hemorrhage  Neuro: Detailed Neurologic Exam  Speech:    Speech is normal; fluent and spontaneous with normal comprehension.  Cognition:    The patient is oriented to person, place, and time;  recent and remote memory intact;     language fluent;     normal attention, concentration,     fund of knowledge Cranial Nerves:    The pupils are equal, round, and reactive to light. The fundi are normal and spontaneous venous pulsations are present. Visual fields are full to finger confrontation. Extraocular movements are intact. Trigeminal sensation is intact and the muscles of mastication are normal. The face is symmetric. The palate elevates in the midline. Hearing intact. Voice is normal. Shoulder shrug is normal. The tongue has normal motion without fasciculations.   Coordination:    Normal finger to nose and heel to shin. Normal rapid alternating movements.   Gait:    Heel-toe and tandem gait are normal.   Motor Observation:    No asymmetry, no atrophy, and no involuntary movements noted. Tone:    Normal muscle tone.    Posture:    Posture is normal. normal erect    Strength:    Strength is V/V in the upper and lower limbs.      Sensation: intact to LT, pin prick, temperature, proprioception however very difficult sensory exam because of all her vocalizations and yelling that painful. For example, patient vocalized loudly and pulled her foot away when I placed the vibrating fork on her toe, reported severe pain and grabbed her foot.     Reflex Exam:  DTR's:    Deep tendon reflexes in the upper and lower  extremities are normal bilaterally.   Toes:    The toes are downgoing bilaterally.   Clonus:    Clonus is absent.       Assessment/Plan:  This is a 60 year old female with a significant history of psychiatric problems, multiple somatic and neurologic complaints presenting with ringing in the ears, paresthesias, muscle pain and spasms, itching, sweating, fatigue, body pain and other complaints. It appears as though she's been extensively and thoroughly evaluated by her primary care physician, [facility] neurology and other physicians. [facility] neurology apparently told her her complaints were psychiatric in etiology. [facility] neurology apparently performed multiple tests and EMG nerve conduction study. I need to review all of [facility] neurology's notes before performing any further studies but unfortunately I'm really not sure I'll have anything to offer patient. Based on her complaints, her descriptions which are dramatic and out of proportion to her physical exam, and her multiple complaints, I also suspect that patient's symptoms are mostly psychiatric in nature. I will however in view of the notes and get back to her if there is anything that I can offer her. I recommended that she follow with psychiatry in therapy.  Addendum 12/15/2015 [facility] neurology notes: patient was initially seen in early May 2016 for abnormal optic nerve OS on CT. patient complained of blurred and double vision. MRI of the brain confirmed a meningioma along the medial aspect of the left optic nerve arising from the optic nerve sheath. 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 Optic nerve looked healthy,benign tumor.  Patient was next seen at the end of May 2016 for right sided back pain, severe, dizziness, ringing in ears, fatigue, leg cramps, tingling all over body. She complained of intermittent dizziness triggered by bending over or rolling in bed, however dizziness  occurring at other times even while sitting in a chair. Patient complained of severe right-sided lower back pain with tingling spreading down the right lower extremity and throughout the rest of her body.  Patient describes tingling is feeling like he probably had significant. Worse on the right side of her body. Massaging her low back pain. Also some weakness in her right lower extremity. She feels tingling in her mouth, copper metal taste in her mouth. He has a history of migraines on Topamax 100 mg twice a day. Blurred vision, difficulty with vision in the left eye more than the right. 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 She has had formal hearing test for tinnitus with an audiologist at [facility]. She also complained of seizure, imbalance, suicidal ideation, depression and sleep disturbances. Neurologic exam is negative except for antalgic gait to right side. She was started on gabapentin and continued on her Topamax. The following tests were performed ANA, Sjogren's, spep, vitamin B6, rheumatoid factor, thyroid, methylmalonic acid and physical  therapy ordered. EMG nerve conduction study showed bilateral L5 paraspinal EMG spontaneous activity possibly, could represent the most minimal electrophysiologic evidence of a lower lumbar radiculopathy that can also be seen with local muscle injury, questionable for L5 radiculopathy. Paresthesias were diagnosed as a small fiber neuropathy.  MRI of the brain May 2016 was completed for dizziness, tinnitus, vision loss, diffuse tingling sensation. 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 MRI of the brain with and without contrast is otherwise negative. Enhancing lesion running along the medial aspect of the left optic nerve is most likely a meningioma arising from the optic nerve sheath. MRI of the orbits with and without contrast is otherwise negative.  MRI of the lumbar spine 12/03/2014: Lumbar spondylosis and degenerative disc disease causing moderate  impingement at L4-L5 and mild impingement at L5-S1, as detailed above. Findings are only very minimally progressive compared to 04/08/2012. At L4-L5 moderate central narrowing of the thecal sac with mild bilateral foraminal stenosis and mild bilateral subarticular lateral recess stenosis due to disc uncovering, disc bulge and facet arthropathy.      Sarina Ill, MD  [facility] [address] [place], [place] [address]  Phone [phone] Fax [phone]

## 2015-12-05 ENCOUNTER — Telehealth: Payer: Self-pay | Admitting: *Deleted

## 2015-12-05 ENCOUNTER — Encounter: Payer: Self-pay | Admitting: Neurology

## 2015-12-05 MED ORDER — ZOLPIDEM TARTRATE 5 MG PO TABS: 5.0000 mg | ORAL_TABLET | Freq: Every evening | ORAL | Status: DC | PRN

## 2015-12-05 NOTE — Telephone Encounter (Signed)
Release faxed to [facility] requesting records and imaging.

## 2015-12-05 NOTE — Telephone Encounter (Signed)
Reviewed [place]; last Ambien was 10 mg, filled Jan 2017; only other controlled substance was Dr. Dossie Arbour hydrocodone in Feb I will lower dose and use less pills, hope to taper off

## 2015-12-09 ENCOUNTER — Telehealth: Payer: Self-pay | Admitting: *Deleted

## 2015-12-09 ENCOUNTER — Other Ambulatory Visit: Payer: Self-pay | Admitting: Family Medicine

## 2015-12-09 NOTE — Telephone Encounter (Signed)
Receive medical records and Imaging on 12/09/2015. Reports on [employer].

## 2015-12-18 ENCOUNTER — Other Ambulatory Visit: Payer: Self-pay | Admitting: Family Medicine

## 2015-12-18 ENCOUNTER — Other Ambulatory Visit: Payer: Self-pay | Admitting: Neurology

## 2015-12-18 ENCOUNTER — Telehealth: Payer: Self-pay | Admitting: Neurology

## 2015-12-18 DIAGNOSIS — R202 Paresthesia of skin: Secondary | ICD-10-CM

## 2015-12-18 DIAGNOSIS — R531 Weakness: Secondary | ICD-10-CM

## 2015-12-18 NOTE — Telephone Encounter (Signed)
Dr Jaynee Eagles- we received records last week on 6/26. I see you addended your note on 7/2. Do you want her to come for f/u?

## 2015-12-18 NOTE — Telephone Encounter (Signed)
Pt's husband called to verify records rec'd are from [facility] before scheduling OV. Please call

## 2015-12-18 NOTE — Telephone Encounter (Signed)
I did, she has been extensively evaluated. The one thing I did not see is an MRI of her cervical spine, I will order it and call with results. thanks

## 2015-12-18 NOTE — Telephone Encounter (Signed)
Request received for 10 mg ambien; denied; this was switched to 5 mg last month

## 2015-12-19 NOTE — Telephone Encounter (Signed)
Called and spoke to pt husband. Advised they will be called to schedule MRI. We have to process via insurance first before they can schedule. If they do not hear about scheduling within the next week or so, advised him to call back. I want to make sure she has appt. He verbalized understanding.

## 2015-12-22 ENCOUNTER — Other Ambulatory Visit: Payer: Self-pay | Admitting: Family Medicine

## 2015-12-27 ENCOUNTER — Ambulatory Visit
Admission: RE | Admit: 2015-12-27 | Discharge: 2015-12-27 | Disposition: A | Discharge status: Home / Self Care | Payer: Medicare Other | Source: Ambulatory Visit | Attending: Neurology | Admitting: Neurology

## 2015-12-27 DIAGNOSIS — R202 Paresthesia of skin: Secondary | ICD-10-CM | POA: Diagnosis not present

## 2015-12-27 DIAGNOSIS — M6289 Other specified disorders of muscle: Secondary | ICD-10-CM | POA: Diagnosis not present

## 2015-12-27 DIAGNOSIS — R531 Weakness: Secondary | ICD-10-CM

## 2015-12-29 NOTE — Assessment & Plan Note (Signed)
With neurogenic pain; will taper up the gabapentin while awaiting appointment with neurologist; she has been dismissed by the pain clinic; she has not been accepting of seeing psychiatry to help evaluate and treat her emotional component of pain and anxiety

## 2015-12-29 NOTE — Assessment & Plan Note (Signed)
I put in a referral to psychiatry earlier in the month, because there appears to be significant overlay of psychiatric involvement or causation to her symptoms; encouraged her to go

## 2015-12-30 ENCOUNTER — Telehealth: Payer: Self-pay | Admitting: *Deleted

## 2015-12-30 NOTE — Telephone Encounter (Signed)
Per Dr Jaynee Eagles, spoke with Clifton James who is on DPR and gave him Dr Cathren Laine report of MRI cervical spine results. Advised him that there is nothing on her scan to explain her symptoms, no pinched nerve roots and her cervical spine looks fine without lesions or pathology. Informed him that unfortunately Dr Jaynee Eagles stated she does not have any further work up to offer at this time. Reminded him that Dr Jaynee Eagles recommended she follow up with therapy with psychiatry. He stated that she continues to have pain "and all these tests show nothing. No one can explain why she is having all this pain." This RN acknowledged his frustration he expressed. He verbalized understanding, appreciation for call.

## 2016-01-01 ENCOUNTER — Other Ambulatory Visit: Payer: Self-pay | Admitting: Family Medicine

## 2016-01-01 NOTE — Telephone Encounter (Signed)
Last Cr reviewed 

## 2016-01-07 ENCOUNTER — Other Ambulatory Visit: Payer: Self-pay

## 2016-01-07 DIAGNOSIS — G8929 Other chronic pain: Secondary | ICD-10-CM

## 2016-01-07 DIAGNOSIS — M79604 Pain in right leg: Secondary | ICD-10-CM

## 2016-01-07 DIAGNOSIS — M5417 Radiculopathy, lumbosacral region: Secondary | ICD-10-CM

## 2016-01-07 MED ORDER — ZOLPIDEM TARTRATE 5 MG PO TABS: 2.5000 mg | ORAL_TABLET | Freq: Every evening | ORAL | 0 refills | Status: DC | PRN

## 2016-01-07 NOTE — Assessment & Plan Note (Signed)
Refer to PT

## 2016-01-07 NOTE — Telephone Encounter (Signed)
Pt husband states needs refill for [facility] and as you had said something about PT referral?

## 2016-01-20 ENCOUNTER — Ambulatory Visit: Payer: Medicare Other | Attending: Family Medicine

## 2016-01-20 DIAGNOSIS — M6281 Muscle weakness (generalized): Secondary | ICD-10-CM | POA: Diagnosis present

## 2016-01-20 DIAGNOSIS — M5441 Lumbago with sciatica, right side: Secondary | ICD-10-CM

## 2016-01-20 NOTE — Patient Instructions (Signed)
Knee to Chest    Lying supine, bend one knee to chest until you feel a stretch in your low back. Hold 20-30 seconds. Alterante and repeat with the opposite leg. Perform 3 times/leg. Do _3__ times per day.   Lumbar Rotation: Caudal - Bilateral (Supine)    Feet and knees together, arms outstretched, rotate knees left, turning head in opposite direction, until stretch is felt. Hold __5__ seconds and then perform in the other direction. Repeat _10___ times per set in each direction. Do _2___ sessions per day.    Pelvic Tilt: Anterior - Legs Bent (Supine)    Rotate pelvis up and arch back. Hold _5___ seconds. Relax. Repeat _10___ times per set. Do _2___ sessions per day.   Pelvic Tilt: Posterior - Legs Bent (Supine)    Tighten stomach and flatten back by rolling pelvis down. Hold _5___ seconds. Relax. Repeat _10___ times per set. Do __2__ sessions per day.

## 2016-01-21 NOTE — Therapy (Signed)
[facility] [address], [place], [address] Phone: [phone]   Fax:  [phone]  Physical Therapy Evaluation  Patient Details  Name: Alice Simmons MRN: [id number] Date of Birth: Nov 19, 1955 Referring Provider: Enid Derry  Encounter Date: 01/20/2016      PT End of Session - 01/21/16 1251    Visit Number 1   Number of Visits 13   Date for PT Re-Evaluation 2016/03/03   Authorization Type g codes   PT Start Time [id number]   PT Stop Time 1615   PT Time Calculation (min) 60 min   Activity Tolerance Patient limited by pain   Behavior During Therapy Restless  Tearful      Past Medical History:  Diagnosis Date  . Anxiety   . Arthritis 11/15/2012  . Brain tumor ([place])   . Breast pain   . Depression   . Depression, controlled   . Diabetes mellitus without complication ([place])   . 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 Fibromyalgia   . Hyperlipidemia   . Hypertension   . Hypertension, benign   . Insomnia   . Obesity   . Obesity (BMI 30.0-34.9)   . Pain of both breasts     Past Surgical History:  Procedure Laterality Date  . ABDOMINAL HYSTERECTOMY    . ABDOMINAL HYSTERECTOMY  2010  . BILATERAL CARPAL TUNNEL RELEASE    . BREAST BIOPSY Bilateral 1980   EXCISIONAL - NEG  . BREAST BIOPSY Right 1985   CORE W/OUT CLIP - NEG  . BREAST SURGERY     benign breast cyst  . CARPAL TUNNEL RELEASE    . CHOLECYSTECTOMY  2014  . EYE SURGERY     cataract surger both eyes    There were no vitals filed for this visit.       Subjective Assessment - 01/20/16 1526    Subjective Low back pain   Pertinent History Pt reports R side low back pain which started approximately 1 year ago. No history of any trauma or injury that pt can recall. Pt describes the pain as throbbing and "feels like electricity." Pt reports pins and needs in her legs. She states that when she smokes marijuana her pain goes away completely. Pt  states with marijuana she can move without pain however once the marijuana is out of her system she feels the pain again. She describes a history of prolonged depression but is unwilling to see a psychiatrist. She describes a tumultuous relationship with her husband from whom she is currently separated. Denies any history of abuse. She has seen 2 neurologists both of which referred her to psychiatry. She has MRI of her head, neck, and lumbar spine without significant findings. Report also indicates that pt has had multiple NCV tests without significant findings. Patient also complains of right shoulder pain as well as pain "all over her body." On her pain diagram she circled her entire body and reported the pain as "pins and needles, stabbing, burning, and numbness." Pt checked every symptom on red flag questionnaire except for urinary tract infections. During the history she intermittently interjects Spanish words in the conversation. She also mumbles at time and can be difficult to understand.      How long can you sit comfortably? Pt reports she as soon as she sits down her back hurts   How long can you stand comfortably? Unable to answer directly   How long can you walk comfortably? 1  hour   Diagnostic tests Brain, cervical, and lumbar MRI. See medical record   Patient Stated Goals I want to be able to move more and be able to return to my activities   Currently in Pain? Yes   Pain Score 7   Best: 0/10, Worst: 10/10   Pain Location Back   Pain Orientation Right   Pain Descriptors / Indicators Throbbing   Pain Type Chronic pain   Pain Radiating Towards Bilateral LEs, Pt unable to describe. "I can't explain it."   Pain Onset More than a month ago   Pain Frequency Constant   Aggravating Factors  gardening, exercises, lifting, household chores   Pain Relieving Factors sidelying, smoking marijuana (pain goes away completely)   Multiple Pain Sites Yes  Pt describes pain "all over her body"             [facility] PT Assessment - 01/21/16 1243      Assessment   Medical Diagnosis Lumbosacral radiculopathy, chronic pain of RLE   Referring Provider Enid Derry   Onset Date/Surgical Date 01/21/15   Next MD Visit Not reported   Prior Therapy Not reported     Precautions   Precautions None     Restrictions   Weight Bearing Restrictions No     Prior Function   Level of Independence Independent     Cognition   Overall Cognitive Status No family/caregiver present to determine baseline cognitive functioning     Observation/Other Assessments   Other Surveys  Other Surveys   Modified [place] 42%  Pt didn't complete travel section of questionnaire     Sensation   Light Touch Not tested     Posture/Postural Control   Posture Comments rounded shoulders and forward head     ROM / Strength   AROM / PROM / Strength Strength;AROM     AROM   Overall AROM Comments UE/LE AROM appears grossly WFL. Pt reports pain with AROM flexion but especially with return to neutral from flexion. Reports pain with extension but "it's a good pain." No peripheralization or centralization. Pain with lumbar lateral flexion and rotation. Will not tolerate overpressure.      Strength   Overall Strength Comments Grossly 4 to 4+/5 but difficult to assess due to reports of pain.     Palpation   Palpation comment Pt reports pain with palpation of bilateral lumbar paraspinals and does not tolerate well. Reports electric shock down into bilateral LEs. Attempted CPA of lumbar spine but pt doesn't tolerate     Transfers   Comments Independent without UE assist     Ambulation/Gait   Gait Comments No gross deviation noted but in depth assessment deferred        TREATMENT  Ther-ex Performed posterior/anterior pelvic tilts, double knee to chest, and hooklying lumbar rotation (knee flops) with patient. Pt instructed in proper form/technique and also provided a written handout with directions.                    PT Education - 01/21/16 1250    Education provided Yes   Education Details HEP and plan of care   Person(s) Educated Patient   Methods Explanation;Handout;Demonstration;Verbal cues   Comprehension Verbalized understanding;Returned demonstration             PT Long Term Goals - 01/21/16 1257      PT LONG TERM GOAL #1   Title Pt will be independent with HEP in order to manage low back pain  and improve function at home   Time 6   Period Weeks   Status New     PT LONG TERM GOAL #2   Title Pt will report decrease in modified ODI by at least 13 points in order to demonstrate clinically significant reduction in back pain   Baseline 01/20/16: 42% (without travel section, pt forgot to complete);   Time 6   Period Weeks   Status New     PT LONG TERM GOAL #3   Title Pt will report worst back pain on NPRS as 7/10 in order to demonstrate clincally significant reduction in back pain   Baseline 01/20/16: worst: 10/10   Time 6   Period Weeks   Status New               Plan - 01/21/16 1252    Clinical Impression Statement Pt was referred for low back pain with radicular symptoms. She is very scatterred in her history today and complains of pain "all over her body." Medical record indicates that pt has seen 2 separate neurologists both of which believe her symptoms are pscyhological in nature. Pt reports that the only thing that relieves her pain are laying on her side as well as smoking marijuana. Pt reports that smoking marijuana completely resolves her symptoms and she is fully functional without pain. PT examination is very limited today due to patient's exaggerated pain response with all movements. Pain with all lumbar AROM and light palpation along paraspinals. Pt will benefit from skilled PT services for graded exercise program to decrease her low back pain and restore normal pain-free function.    Rehab Potential Poor   Clinical Impairments  Affecting Rehab Potential Positive: age, Negative: no known etiology to explain symptoms, hx of prolonged depression, chronicity   PT Frequency 2x / week   PT Duration 6 weeks   PT Treatment/Interventions Aquatic Therapy;Biofeedback;Cryotherapy;Electrical Stimulation;Iontophoresis 4mg /ml Dexamethasone;Moist Heat;Traction;Ultrasound;Therapeutic activities;Therapeutic exercise;Balance training;Neuromuscular re-education;Cognitive remediation;Patient/family education;Manual techniques;Passive range of motion;Dry needling   PT Next Visit Plan Graded exercise program with slow increase in intensity. Progress independence with HEP. Continued encouragement of medical management for psychiatric concerns   PT Home Exercise Plan Supine ant/post pelvic tilts, double knee to chest, Hooklying lumbar rotation   Consulted and Agree with Plan of Care Patient      Patient will benefit from skilled therapeutic intervention in order to improve the following deficits and impairments:  Pain, Decreased strength, Postural dysfunction  Visit Diagnosis: Right-sided low back pain with right-sided sciatica - Plan: PT plan of care cert/re-cert  Muscle weakness (generalized) - Plan: PT plan of care cert/re-cert      G-Codes - [id number] 1300    Functional Assessment Tool Used clinical judgement, modified ODI, NPRS   Functional Limitation Mobility: Walking and moving around   Mobility: Walking and Moving Around Current Status [phone]) At least 60 percent but less than 80 percent impaired, limited or restricted   Mobility: Walking and Moving Around Goal Status [phone]) At least 40 percent but less than 60 percent impaired, limited or restricted       Problem List Patient Active Problem List   Diagnosis Date Noted  . Fasciculations 11/21/2015  . Toxic effect of mercury 10/15/2015  . Small fiber neuropathy ([place]) 10/10/2015  . Fatigue 10/10/2015  . Abnormal weight loss 10/10/2015  . Encounter for therapeutic drug  level monitoring 10/10/2015  . Lymphocytosis 10/10/2015  . Elevated liver enzymes 10/10/2015  . H/O cosmetic surgery 08/14/2015  . Chronic lower extremity  pain (Right) 07/18/2015  . Hypertension goal BP (blood pressure) < 140/90 06/05/2015  . Bilateral tinnitus 06/05/2015  . Left breast mass 06/05/2015  . Neurogenic pain 05/07/2015  . Musculoskeletal pain 05/07/2015  . Diffuse myofascial pain syndrome 05/07/2015  . Allodynia 05/07/2015  . 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 Marijuana abuse, continuous 04/25/2015  . Fibromyalgia 04/20/2015  . Generalized Paresthesias 04/20/2015  . Paresthesias with subjective weakness 04/20/2015  . Chronic pain 04/20/2015  . Sensitivity to sunlight 04/20/2015  . Continuous illicit drug use [id number]  . Chronic low back pain (Location of Primary Source of Pain) (Bilateral) (R>L) 03/21/2015  . Chronic pain syndrome 03/21/2015  . Lumbar facet syndrome (Bilateral) (R>L) 03/21/2015  . Lumbar radicular pain 03/21/2015  . Lumbosacral radiculopathy 03/21/2015  . Lumbar spondylosis 03/21/2015  . Clinical depression 11/28/2014  . Diabetes mellitus type 2, controlled, without complications ([place]) [id number]  . Mass of left breast on mammogram 11/28/2014  . Back pain, thoracic 11/28/2014  . Hyperlipidemia LDL goal <100 11/28/2014  . Vitamin D deficiency 11/28/2014  . 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 Bipolar disorder, current episode depressed, severe, with psychotic features ([place]) 12/29/2013  . Self-inflicted injury [id number]  . Migraine headache 08/17/2013  . Chronic insomnia 08/17/2013  . Tobacco abuse 05/01/2013  . Apnea, sleep 03/02/2012  . Anxiety 03/04/2011   Phillips Grout PT, DPT   Amisha Pospisil 01/21/2016, 1:05 PM  [facility] [address], [place], [address] Phone: [phone]   Fax:  [phone]  Name: KORRYN MONTANARI MRN:  [id number] Date of Birth: [birth year]

## 2016-01-23 ENCOUNTER — Ambulatory Visit (INDEPENDENT_AMBULATORY_CARE_PROVIDER_SITE_OTHER): Payer: Medicare Other | Admitting: Licensed Clinical Social Worker

## 2016-01-23 ENCOUNTER — Encounter: Payer: Self-pay | Admitting: Licensed Clinical Social Worker

## 2016-01-23 DIAGNOSIS — F411 Generalized anxiety disorder: Secondary | ICD-10-CM | POA: Diagnosis not present

## 2016-01-23 DIAGNOSIS — F332 Major depressive disorder, recurrent severe without psychotic features: Secondary | ICD-10-CM | POA: Diagnosis not present

## 2016-01-23 DIAGNOSIS — F410 Panic disorder [episodic paroxysmal anxiety] without agoraphobia: Secondary | ICD-10-CM
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
## 2016-02-04 ENCOUNTER — Ambulatory Visit: Payer: Medicare Other | Admitting: Psychiatry

## 2016-02-05 ENCOUNTER — Encounter: Payer: Medicare Other | Admitting: Physical Therapy

## 2016-02-12 ENCOUNTER — Ambulatory Visit: Payer: Medicare Other | Admitting: Physical Therapy

## 2016-02-13 ENCOUNTER — Ambulatory Visit
Admission: RE | Admit: 2016-02-13 | Discharge: 2016-02-13 | Disposition: A | Discharge status: Home / Self Care | Payer: Medicare Other | Source: Ambulatory Visit | Attending: Family Medicine | Admitting: Family Medicine

## 2016-02-13 DIAGNOSIS — N63 Unspecified lump in breast: Secondary | ICD-10-CM | POA: Diagnosis present

## 2016-02-22 ENCOUNTER — Other Ambulatory Visit: Payer: Self-pay | Admitting: Family Medicine

## 2016-02-22 DIAGNOSIS — G43009 Migraine without aura, not intractable, without status migrainosus: Secondary | ICD-10-CM

## 2016-02-24 ENCOUNTER — Telehealth: Payer: Self-pay | Admitting: Neurology

## 2016-02-24 NOTE — Telephone Encounter (Signed)
Jamie/[facility] [phone] called sts Dr Enid Derry wanted to know if Dr A would prescribe the pt's topamax. Please call

## 2016-02-24 NOTE — Telephone Encounter (Signed)
Please contact Dr. Cathren Laine office and see if the neurologist would be willing to take over the topamax prescription

## 2016-02-24 NOTE — Telephone Encounter (Signed)
Patient will not be following up with me, there is no further neurologic workup needed and she does not have a follow up scheduled sso she needs to get her prescriptions from another doctor like primary care. Please let them know thanks.  thanks

## 2016-02-24 NOTE — Telephone Encounter (Signed)
Dr Ahern- please advise 

## 2016-02-24 NOTE — Telephone Encounter (Signed)
Staff is sending dr. Loni Muse message, they will get back to [place]

## 2016-02-25 MED ORDER — TOPIRAMATE 100 MG PO TABS: 200.0000 mg | ORAL_TABLET | Freq: Every day | ORAL | 2 refills | Status: DC

## 2016-02-25 NOTE — Telephone Encounter (Signed)
Thank you, I refilled it

## 2016-02-25 NOTE — Telephone Encounter (Signed)
[facility] called back they state pt does not have a f/u and there is no other neurological workup needed through them. So they did deny this rx

## 2016-02-25 NOTE — Telephone Encounter (Signed)
Called and spoke to [place] at [facility]. Relayed Dr Cathren Laine message below. She verbalized understanding and will let Dr Sanda Klein know.

## 2016-02-27 ENCOUNTER — Other Ambulatory Visit: Payer: Self-pay

## 2016-02-27 MED ORDER — LABETALOL HCL 300 MG PO TABS: 300.0000 mg | ORAL_TABLET | Freq: Two times a day (BID) | ORAL | 1 refills | Status: DC

## 2016-03-16 ENCOUNTER — Other Ambulatory Visit: Payer: Self-pay

## 2016-03-16 MED ORDER — AMLODIPINE BESYLATE 10 MG PO TABS: 10.0000 mg | ORAL_TABLET | Freq: Every day | ORAL | 1 refills | Status: DC

## 2016-03-27 ENCOUNTER — Other Ambulatory Visit: Payer: Self-pay | Admitting: Family Medicine

## 2016-03-27 NOTE — Telephone Encounter (Signed)
Last K+ and Cr reviewed from May 2017; Rx approved

## 2016-04-23 DIAGNOSIS — H04031 Chronic enlargement of right lacrimal gland: Secondary | ICD-10-CM | POA: Diagnosis not present

## 2016-05-07 ENCOUNTER — Other Ambulatory Visit: Payer: Self-pay | Admitting: Family Medicine

## 2016-05-08 NOTE — Telephone Encounter (Signed)
Please ask pt to schedule an appt; last visit was about 6 months ago; thank you Rx sent as requested

## 2016-05-11 NOTE — Telephone Encounter (Signed)
LMOM for pt to call the office to schedule an appt °

## 2016-05-12 ENCOUNTER — Other Ambulatory Visit: Payer: Self-pay | Admitting: Family Medicine

## 2016-05-12 DIAGNOSIS — G43009 Migraine without aura, not intractable, without status migrainosus: Secondary | ICD-10-CM

## 2016-05-25 ENCOUNTER — Other Ambulatory Visit: Payer: Self-pay

## 2016-05-25 DIAGNOSIS — F315 Bipolar disorder, current episode depressed, severe, with psychotic features: Secondary | ICD-10-CM

## 2016-05-25 NOTE — Telephone Encounter (Signed)
Please contact patient and ask her to please get the paxil from her psychiatrist (mental health provider) I referred her to psychiatry in May I have not seen her in over 6 months, so please recommend appointment for follow-up as well Thank you

## 2016-06-01 ENCOUNTER — Other Ambulatory Visit: Payer: Self-pay

## 2016-06-01 ENCOUNTER — Other Ambulatory Visit: Payer: Self-pay | Admitting: Family Medicine

## 2016-06-01 DIAGNOSIS — F315 Bipolar disorder, current episode depressed, severe, with psychotic features: Secondary | ICD-10-CM

## 2016-06-01 MED ORDER — PAROXETINE HCL 30 MG PO TABS: 30.0000 mg | ORAL_TABLET | Freq: Two times a day (BID) | ORAL | 0 refills | Status: DC

## 2016-06-01 NOTE — Telephone Encounter (Signed)
Patient has an appt for 06/03/16; Rx approved

## 2016-06-03 ENCOUNTER — Other Ambulatory Visit: Payer: Self-pay | Admitting: Family Medicine

## 2016-06-03 ENCOUNTER — Encounter: Payer: Self-pay | Admitting: Family Medicine

## 2016-06-03 ENCOUNTER — Ambulatory Visit (INDEPENDENT_AMBULATORY_CARE_PROVIDER_SITE_OTHER): Payer: Medicare Other | Admitting: Family Medicine

## 2016-06-03 VITALS — BP 154/92 | HR 54 | Temp 99.5°F | Resp 16 | Wt 131.1 lb

## 2016-06-03 DIAGNOSIS — E559 Vitamin D deficiency, unspecified: Secondary | ICD-10-CM | POA: Diagnosis not present

## 2016-06-03 DIAGNOSIS — Z23 Encounter for immunization: Secondary | ICD-10-CM

## 2016-06-03 DIAGNOSIS — R001 Bradycardia, unspecified: Secondary | ICD-10-CM | POA: Diagnosis not present

## 2016-06-03 DIAGNOSIS — R079 Chest pain, unspecified: Secondary | ICD-10-CM | POA: Diagnosis not present

## 2016-06-03 DIAGNOSIS — N6320 Unspecified lump in the left breast, unspecified quadrant: Secondary | ICD-10-CM | POA: Diagnosis not present

## 2016-06-03 DIAGNOSIS — Z5181 Encounter for therapeutic drug level monitoring: Secondary | ICD-10-CM

## 2016-06-03 DIAGNOSIS — R748 Abnormal levels of other serum enzymes: Secondary | ICD-10-CM | POA: Diagnosis not present

## 2016-06-03 DIAGNOSIS — E785 Hyperlipidemia, unspecified: Secondary | ICD-10-CM | POA: Diagnosis not present

## 2016-06-03 DIAGNOSIS — D72820 Lymphocytosis (symptomatic): Secondary | ICD-10-CM | POA: Diagnosis not present

## 2016-06-03 LAB — COMPLETE METABOLIC PANEL WITH GFR

## 2016-06-03 LAB — LIPID PANEL

## 2016-06-03 LAB — CBC WITH DIFFERENTIAL/PLATELET

## 2016-06-03 LAB — TOPIRAMATE LEVEL

## 2016-06-03 MED ORDER — CIPROFLOXACIN-DEXAMETHASONE 0.3-0.1 % OT SUSP: 4.0000 [drp] | Freq: Two times a day (BID) | OTIC | 0 refills | Status: AC

## 2016-06-03 MED ORDER — LABETALOL HCL 200 MG PO TABS: 200.0000 mg | ORAL_TABLET | Freq: Two times a day (BID) | ORAL | 0 refills | Status: DC

## 2016-06-03 NOTE — Assessment & Plan Note (Signed)
Ordered breast imaging

## 2016-06-03 NOTE — Assessment & Plan Note (Signed)
Check CBC 

## 2016-06-03 NOTE — Assessment & Plan Note (Signed)
Refer to cardiologist as was recommended in the ER; check TSH

## 2016-06-03 NOTE — Assessment & Plan Note (Signed)
Check lipid panel; encouraged diet low in saturated fats

## 2016-06-03 NOTE — Progress Notes (Signed)
BP (!) 154/92   Pulse (!) 54   Temp 99.5 F (37.5 C) (Oral)   Resp 16   Wt 131 lb 2 oz (59.5 kg)   LMP  (Approximate)   SpO2 98%   BMI 25.61 kg/m    Subjective:    Patient ID: Alice Simmons, female    DOB: Jan 01, 1956, 60 y.o.   MRN: [id number]  HPI: Alice Simmons is a 60 y.o. female  Chief Complaint  Patient presents with  . Referral    cardiologist  . Ear Pain    Dizziness, pooping in ear and bleeding in left ear  MD note: POPPING in left ear  Patient is here for follow-up Doing well overall; biggest complaint today is popping and bleeding from her left ear Noticed some drainage from the left ear; was trying to clean it out and noted some blood; can't hear well; right ear is also blocked up She went to the ER months ago and had a slow heart rate; she was supposed to have followed up with cardiologist, but never did; she says she overall feels fine except for her ear Later in the visit, she was giving blood and complained of chest pain to the phlebotomist; husband said he isn't sure if she is having a panic attack when this happens or if it's really her heart She never had the studies done for her left breast  Depression screen [facility] 2/9 06/03/2016 11/12/2015 08/14/2015 07/18/2015 06/05/2015  Decreased Interest 0 0 0 0 0  Down, Depressed, Hopeless 0 0 1 0 0  PHQ - 2 Score 0 0 1 0 0  Altered sleeping - - - - -  Tired, decreased energy - - - - -  Change in appetite - - - - -  Feeling bad or failure about yourself  - - - - -  Trouble concentrating - - - - -  Moving slowly or fidgety/restless - - - - -  Suicidal thoughts - - - - -  PHQ-9 Score - - - - -  Difficult doing work/chores - - - - -  Some encounter information is confidential and restricted. Go to Review Flowsheets activity to see all data.   Relevant past medical, surgical, family and social history reviewed Past Medical History:  Diagnosis Date  . Anxiety   . Arthritis 11/15/2012  . Brain tumor ([place])     . Breast pain   . Depression   . Depression, controlled   . Diabetes mellitus without complication ([place])   . 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 Fibromyalgia   . Hyperlipidemia   . Hypertension   . Hypertension, benign   . Insomnia   . Obesity   . Obesity (BMI 30.0-34.9)   . Pain of both breasts    Past Surgical History:  Procedure Laterality Date  . ABDOMINAL HYSTERECTOMY    . ABDOMINAL HYSTERECTOMY  2010  . BILATERAL CARPAL TUNNEL RELEASE    . BREAST BIOPSY Bilateral 1980   EXCISIONAL - NEG  . BREAST BIOPSY Right 1985   CORE W/OUT CLIP - NEG  . BREAST SURGERY     benign breast cyst  . CARPAL TUNNEL RELEASE    . CHOLECYSTECTOMY  2014  . EYE SURGERY     cataract surger both eyes   Family History  Problem Relation Age of Onset  . Heart disease Mother   . Diabetes Mother   . Hypertension Mother   . Diabetes Father   . Hypertension  Father   . Hyperlipidemia Sister   . Hyperlipidemia Brother   . Heart disease Brother   . Hyperlipidemia Brother   . Hyperlipidemia Brother   . Hyperlipidemia Brother   . Breast cancer Paternal Aunt   . Neuropathy Neg Hx    Social History  Substance Use Topics  . Smoking status: Current Every Day Smoker    Packs/day: 1.00    Years: 10.00    Types: Cigarettes  . Smokeless tobacco: Never Used  . Alcohol use No     Comment: occasional   Interim medical history since last visit reviewed. Allergies and medications reviewed  Review of Systems Per HPI unless specifically indicated above     Objective:    BP (!) 154/92   Pulse (!) 54   Temp 99.5 F (37.5 C) (Oral)   Resp 16   Wt 131 lb 2 oz (59.5 kg)   LMP  (Approximate)   SpO2 98%   BMI 25.61 kg/m   Wt Readings from Last 3 Encounters:  06/03/16 131 lb 2 oz (59.5 kg)  12/04/15 131 lb 6.4 oz (59.6 kg)  11/12/15 132 lb (59.9 kg)    Physical Exam  Constitutional: She appears well-developed and well-nourished. No distress.  HENT:  Right Ear: No lacerations. There is  swelling. No foreign bodies. No mastoid tenderness.  Left Ear: No lacerations. There is drainage and swelling. No foreign bodies. No mastoid tenderness. Decreased hearing is noted.  Mouth/Throat: Oropharynx is clear and moist.  Cardiovascular: Regular rhythm.  Bradycardia present.   Pulmonary/Chest: Effort normal and breath sounds normal. She has no decreased breath sounds. She has no wheezes.  Abdominal: She exhibits no distension.  Lymphadenopathy:    She has no cervical adenopathy.  Neurological: She is alert. She displays no tremor.  No tics  Skin: She is not diaphoretic. No pallor.  Psychiatric:  Animated, good eye contact; did not appear anxious at first, cooperative; when in the phlebomatist's chair complaining of chest pain, appeared anxious; axis II deferred      Assessment & Plan:   Problem List Items Addressed This Visit      Other   Vitamin D deficiency    Check vit D level      Lymphocytosis    Check CBC      Relevant Orders   CBC with Differential/Platelet (Completed)   Left breast mass    Ordered breast imaging      Relevant Orders   US BREAST LTD UNI LEFT INC AXILLA   Hyperlipidemia LDL goal <100 (Chronic)    Check lipid panel; encouraged diet low in saturated fats      Relevant Medications   labetalol (NORMODYNE) 200 MG tablet   Other Relevant Orders   Lipid panel (Completed)   Encounter for therapeutic drug level monitoring    Check topirimate level      Relevant Orders   Topiramate level (Completed)   Elevated liver enzymes    Hx of elevated enzymes; recheck today      Relevant Orders   COMPLETE METABOLIC PANEL WITH GFR (Completed)   Bradycardia - Primary    Refer to cardiologist as was recommended in the ER; check TSH      Relevant Orders   Ambulatory referral to Cardiology   EKG 12-Lead (Completed)    Other Visit Diagnoses    Need for diphtheria-tetanus-pertussis (Tdap) vaccine       Relevant Orders   Tdap vaccine greater than or  equal to 7yo IM (  Completed)   Chest pain at rest       husband not sure if panic attack or not; given age, previous need to f/u with cardiology and lack thereof, we called 911 to transport her to ER      Follow up plan: Return in about 2 weeks (around 06/17/2016) for recheck of ears.  An after-visit summary was printed and given to the patient at [place].  Please see the patient instructions which may contain other information and recommendations beyond what is mentioned above in the assessment and plan.  Meds ordered this encounter  Medications  . ciprofloxacin-dexamethasone (CIPRODEX) otic suspension    Sig: Place 4 drops into both ears 2 (two) times daily.    Dispense:  7.5 mL    Refill:  0  . labetalol (NORMODYNE) 200 MG tablet    Sig: Take 1 tablet (200 mg total) by mouth 2 (two) times daily. New lower strength    Dispense:  60 tablet    Refill:  0    CANCEL the 300 mg strength    Orders Placed This Encounter  Procedures  . US BREAST LTD UNI LEFT INC AXILLA  . Tdap vaccine greater than or equal to 7yo IM  . CBC with Differential/Platelet  . Lipid panel  . COMPLETE METABOLIC PANEL WITH GFR  . Topiramate level  . Ambulatory referral to Cardiology  . EKG 12-Lead

## 2016-06-03 NOTE — Assessment & Plan Note (Signed)
Check topirimate level

## 2016-06-03 NOTE — Assessment & Plan Note (Signed)
Check vit D level. 

## 2016-06-03 NOTE — Patient Instructions (Addendum)
Start the new ear drops Return in two weeks for a recheck of the left ear We'll get follow-up imaging of your left breast, to compare to older pictures We'll refer you to the cardiologist We'll get labs today

## 2016-06-03 NOTE — Assessment & Plan Note (Signed)
Hx of elevated enzymes; recheck today

## 2016-06-10 ENCOUNTER — Other Ambulatory Visit: Payer: Self-pay | Admitting: Family Medicine

## 2016-06-10 DIAGNOSIS — G43009 Migraine without aura, not intractable, without status migrainosus: Secondary | ICD-10-CM

## 2016-06-17 ENCOUNTER — Other Ambulatory Visit: Payer: Self-pay

## 2016-06-17 DIAGNOSIS — G43009 Migraine without aura, not intractable, without status migrainosus: Secondary | ICD-10-CM

## 2016-06-17 DIAGNOSIS — Z5181 Encounter for therapeutic drug level monitoring: Secondary | ICD-10-CM

## 2016-06-17 DIAGNOSIS — I10 Essential (primary) hypertension: Secondary | ICD-10-CM

## 2016-06-17 DIAGNOSIS — E785 Hyperlipidemia, unspecified: Secondary | ICD-10-CM

## 2016-06-17 NOTE — Telephone Encounter (Signed)
Glitch in our system; I was not receiving electronic refill requests from Dec 13 until yesterday; addressed with IT; addressing now ------------------------------------ Rx approved Alice Simmons, when patient was here, the lab tech canceled her lab orders because she was transported to the hospital; she did not get labs at the hospital, so I still need to get her labs done sometime soon Please order and ask her to come back in for labs in the next week or two Thank you

## 2016-06-17 NOTE — Telephone Encounter (Signed)
That's fine thank you

## 2016-06-17 NOTE — Telephone Encounter (Signed)
Please disregard previous documentation I looked at your note patient will come in for and appt next Tuesday for recheck of ears and will get bloodwork then.

## 2016-06-17 NOTE — Telephone Encounter (Signed)
Patients husband notified will bring her in next Tuesday for labs.  He states You also wanted to see her back to check her ears, b/c she had an ear infection.  Please advise?

## 2016-06-23 ENCOUNTER — Encounter: Payer: Self-pay | Admitting: Family Medicine

## 2016-06-23 ENCOUNTER — Other Ambulatory Visit: Payer: Self-pay

## 2016-06-23 ENCOUNTER — Ambulatory Visit (INDEPENDENT_AMBULATORY_CARE_PROVIDER_SITE_OTHER): Payer: Medicare Other | Admitting: Family Medicine

## 2016-06-23 DIAGNOSIS — G43009 Migraine without aura, not intractable, without status migrainosus: Secondary | ICD-10-CM | POA: Diagnosis not present

## 2016-06-23 DIAGNOSIS — E785 Hyperlipidemia, unspecified: Secondary | ICD-10-CM

## 2016-06-23 DIAGNOSIS — F411 Generalized anxiety disorder: Secondary | ICD-10-CM

## 2016-06-23 DIAGNOSIS — I10 Essential (primary) hypertension: Secondary | ICD-10-CM | POA: Diagnosis not present

## 2016-06-23 DIAGNOSIS — R001 Bradycardia, unspecified: Secondary | ICD-10-CM | POA: Diagnosis not present

## 2016-06-23 DIAGNOSIS — Z5181 Encounter for therapeutic drug level monitoring: Secondary | ICD-10-CM

## 2016-06-23 DIAGNOSIS — F315 Bipolar disorder, current episode depressed, severe, with psychotic features: Secondary | ICD-10-CM

## 2016-06-23 LAB — COMPLETE METABOLIC PANEL WITH GFR
ALBUMIN: 3.8 g/dL (ref 3.6–5.1)
ALK PHOS: 62 U/L (ref 33–130)
ALT: 15 U/L (ref 6–29)
AST: 20 U/L (ref 10–35)
BUN: 20 mg/dL (ref 7–25)
CO2: 24 mmol/L (ref 20–31)
Calcium: 8.7 mg/dL (ref 8.6–10.4)
Chloride: 112 mmol/L — ABNORMAL HIGH (ref 98–110)
Creat: 0.88 mg/dL (ref 0.50–1.05)
GFR, Est African American: 84 mL/min (ref >=60)
GFR, Est Non African American: 73 mL/min (ref >=60)
GLUCOSE: 88 mg/dL (ref 65–99)
Potassium: 4.3 mmol/L (ref 3.5–5.3)
SODIUM: 142 mmol/L (ref 135–146)
Total Bilirubin: 0.3 mg/dL (ref 0.2–1.2)
Total Protein: 6.6 g/dL (ref 6.1–8.1)

## 2016-06-23 LAB — LIPID PANEL
Cholesterol: 206 mg/dL — ABNORMAL HIGH (ref <200)
HDL: 36 mg/dL — ABNORMAL LOW (ref >50)
LDL Cholesterol: 151 mg/dL — ABNORMAL HIGH (ref <100)
TRIGLYCERIDES: 97 mg/dL (ref <150)
Total CHOL/HDL Ratio: 5.7 Ratio — ABNORMAL HIGH (ref <5.0)
VLDL: 19 mg/dL (ref <30)

## 2016-06-23 NOTE — Patient Instructions (Addendum)
Your goal blood pressure is less than 140 mmHg on top. Try to follow the DASH guidelines (DASH stands for Dietary Approaches to Stop Hypertension) Try to limit the sodium in your diet.  Ideally, consume less than 1.5 grams (less than 1,500mg ) per day. Do not add salt when cooking or at the table.  Check the sodium amount on labels when shopping, and choose items lower in sodium when given a choice. Avoid or limit foods that already contain a lot of sodium. Eat a diet rich in fruits and vegetables and whole grains. Please do follow-up with the cardiologist and the psychiatrist Return to see me in 3 months  DASH Eating Plan DASH stands for "Dietary Approaches to Stop Hypertension." The DASH eating plan is a healthy eating plan that has been shown to reduce high blood pressure (hypertension). Additional health benefits may include reducing the risk of type 2 diabetes mellitus, heart disease, and stroke. The DASH eating plan may also help with weight loss. What do I need to know about the DASH eating plan? For the DASH eating plan, you will follow these general guidelines:  Choose foods with less than 150 milligrams of sodium per serving (as listed on the food label).  Use salt-free seasonings or herbs instead of table salt or sea salt.  Check with your health care provider or pharmacist before using salt substitutes.  Eat lower-sodium products. These are often labeled as "low-sodium" or "no salt added."  Eat fresh foods. Avoid eating a lot of canned foods.  Eat more vegetables, fruits, and low-fat dairy products.  Choose whole grains. Look for the word "whole" as the first word in the ingredient list.  Choose fish and skinless chicken or [place] more often than red meat. Limit fish, poultry, and meat to 6 oz (170 g) each day.  Limit sweets, desserts, sugars, and sugary drinks.  Choose heart-healthy fats.  Eat more home-cooked food and less restaurant, buffet, and fast food.  Limit fried  foods.  Do not fry foods. Cook foods using methods such as baking, boiling, grilling, and broiling instead.  When eating at a restaurant, ask that your food be prepared with less salt, or no salt if possible. What foods can I eat? Seek help from a dietitian for individual calorie needs. Grains  Whole grain or whole wheat bread. Brown rice. Whole grain or whole wheat pasta. Quinoa, bulgur, and whole grain cereals. Low-sodium cereals. Corn or whole wheat flour tortillas. Whole grain cornbread. Whole grain crackers. Low-sodium crackers. Vegetables  Fresh or frozen vegetables (raw, steamed, roasted, or grilled). Low-sodium or reduced-sodium tomato and vegetable juices. Low-sodium or reduced-sodium tomato sauce and paste. Low-sodium or reduced-sodium canned vegetables. Fruits  All fresh, canned (in natural juice), or frozen fruits. Meat and Other Protein Products  Ground beef (85% or leaner), grass-fed beef, or beef trimmed of fat. Skinless chicken or [place]. Ground chicken or [place]. Pork trimmed of fat. All fish and seafood. Eggs. Dried beans, peas, or lentils. Unsalted nuts and seeds. Unsalted canned beans. Dairy  Low-fat dairy products, such as skim or 1% milk, 2% or reduced-fat cheeses, low-fat ricotta or cottage cheese, or plain low-fat yogurt. Low-sodium or reduced-sodium cheeses. Fats and Oils  Tub margarines without trans fats. Light or reduced-fat mayonnaise and salad dressings (reduced sodium). Avocado. Safflower, olive, or canola oils. Natural peanut or almond butter. Other  Unsalted popcorn and pretzels. The items listed above may not be a complete list of recommended foods or beverages. Contact your dietitian for  more options.  What foods are not recommended? Grains  White bread. White pasta. White rice. Refined cornbread. Bagels and croissants. Crackers that contain trans fat. Vegetables  Creamed or fried vegetables. Vegetables in a cheese sauce. Regular canned vegetables. Regular  canned tomato sauce and paste. Regular tomato and vegetable juices. Fruits  Canned fruit in light or heavy syrup. Fruit juice. Meat and Other Protein Products  Fatty cuts of meat. Ribs, chicken wings, bacon, sausage, bologna, salami, chitterlings, fatback, hot dogs, bratwurst, and packaged luncheon meats. Salted nuts and seeds. Canned beans with salt. Dairy  Whole or 2% milk, cream, half-and-half, and cream cheese. Whole-fat or sweetened yogurt. Full-fat cheeses or blue cheese. Nondairy creamers and whipped toppings. Processed cheese, cheese spreads, or cheese curds. Condiments  Onion and garlic salt, seasoned salt, table salt, and sea salt. Canned and packaged gravies. Worcestershire sauce. Tartar sauce. Barbecue sauce. Teriyaki sauce. Soy sauce, including reduced sodium. Steak sauce. Fish sauce. Oyster sauce. Cocktail sauce. Horseradish. Ketchup and mustard. Meat flavorings and tenderizers. Bouillon cubes. Hot sauce. Tabasco sauce. Marinades. Taco seasonings. Relishes. Fats and Oils  Butter, stick margarine, lard, shortening, ghee, and bacon fat. Coconut, palm kernel, or palm oils. Regular salad dressings. Other  Pickles and olives. Salted popcorn and pretzels. The items listed above may not be a complete list of foods and beverages to avoid. Contact your dietitian for more information.  Where can I find more information? National Heart, Lung, and Blood Institute: travelstabloid.com This information is not intended to replace advice given to you by your health care provider. Make sure you discuss any questions you have with your health care provider. Document Released: 05/21/2011 Document Revised: 11/07/2015 Document Reviewed: 04/05/2013 Elsevier Interactive Patient Education  2017 [employer].

## 2016-06-23 NOTE — Progress Notes (Signed)
BP 138/82   Pulse 81   Temp 98.8 F (37.1 C) (Oral)   Resp 14   Wt 129 lb 3.2 oz (58.6 kg)   SpO2 97%   BMI 25.23 kg/m    Subjective:    Patient ID: Alice Simmons, female    DOB: 07/29/1955, 61 y.o.   MRN: [id number]  HPI: Alice Simmons is a 61 y.o. female  Chief Complaint  Patient presents with  . Follow-up    recheck ears   She is here to recheck her ears; her ears are doing better; she used the drops prescribed at the previous visit  She talked about her dentist; they are going to clean her teeth but need her to see the cardiologist first  She goes to her cardiologist tomorrow; she is on the lower dose of the labetalol; BP controlled; does not use excess salt  She has fibromyalgia; pulling nerves, up in her legs, through the heart and to the teeth; she has bubbles and going into her brain; I'm not sure what she meant and after several attempts to get more clarification, the best I could understand was that she felt bubbles of air across her teeth  I asked about psychiatry appointment; she says she has an appointment soon; on paroxetine and topirimate  She has a little cold, nothing major  Depression screen [facility] 2/9 06/03/2016 11/12/2015 08/14/2015 07/18/2015 06/05/2015  Decreased Interest 0 0 0 0 0  Down, Depressed, Hopeless 0 0 1 0 0  PHQ - 2 Score 0 0 1 0 0  Altered sleeping - - - - -  Tired, decreased energy - - - - -  Change in appetite - - - - -  Feeling bad or failure about yourself  - - - - -  Trouble concentrating - - - - -  Moving slowly or fidgety/restless - - - - -  Suicidal thoughts - - - - -  PHQ-9 Score - - - - -  Difficult doing work/chores - - - - -  Some encounter information is confidential and restricted. Go to Review Flowsheets activity to see all data.   Relevant past medical, surgical, family and social history reviewed Past Medical History:  Diagnosis Date  . Anxiety   . Arthritis 11/15/2012  . Brain tumor ([place])   . Breast pain   .  Depression   . Depression, controlled   . Diabetes mellitus without complication ([place])   . 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 Fibromyalgia   . Hyperlipidemia   . Hypertension   . Hypertension, benign   . Insomnia   . Obesity   . Obesity (BMI 30.0-34.9)   . Pain of both breasts    Past Surgical History:  Procedure Laterality Date  . ABDOMINAL HYSTERECTOMY    . ABDOMINAL HYSTERECTOMY  2010  . BILATERAL CARPAL TUNNEL RELEASE    . BREAST BIOPSY Bilateral 1980   EXCISIONAL - NEG  . BREAST BIOPSY Right 1985   CORE W/OUT CLIP - NEG  . BREAST SURGERY     benign breast cyst  . CARPAL TUNNEL RELEASE    . CHOLECYSTECTOMY  2014  . EYE SURGERY     cataract surger both eyes   Social History  Substance Use Topics  . Smoking status: Current Every Day Smoker    Packs/day: 1.00    Years: 20.00    Types: Cigarettes  . Smokeless tobacco: Never Used  . Alcohol use No  Comment: occasional   Interim medical history since last visit reviewed. Allergies and medications reviewed  Review of Systems Per HPI unless specifically indicated above     Objective:    BP 138/82   Pulse 81   Temp 98.8 F (37.1 C) (Oral)   Resp 14   Wt 129 lb 3.2 oz (58.6 kg)   SpO2 97%   BMI 25.23 kg/m   Wt Readings from Last 3 Encounters:  06/24/16 126 lb (57.2 kg)  06/23/16 129 lb 3.2 oz (58.6 kg)  06/03/16 131 lb 2 oz (59.5 kg)    Physical Exam  Constitutional: She appears well-developed and well-nourished. No distress.  HENT:  Right Ear: No lacerations. No drainage or swelling. No foreign bodies. No mastoid tenderness. No decreased hearing is noted.  Left Ear: No lacerations. No drainage or swelling. No foreign bodies. No mastoid tenderness. No decreased hearing is noted.  Nose: No rhinorrhea.  Mouth/Throat: Oropharynx is clear and moist. No oral lesions. No dental abscesses.  Cardiovascular: Normal rate and regular rhythm.   Pulmonary/Chest: Effort normal and breath sounds normal. She has no  decreased breath sounds. She has no wheezes.  Lymphadenopathy:    She has no cervical adenopathy.  Neurological: She is alert. She displays no tremor.  No tics  Skin: She is not diaphoretic. No pallor.  Psychiatric:  Animated, good eye contact; cooperative; axis II deferred      Assessment & Plan:   Problem List Items Addressed This Visit      Cardiovascular and Mediastinum   Hypertension goal BP (blood pressure) < 140/90    Continue the lower dose of labetalol and see cardiologist tomorrow; avoid excess salt        Other   Generalized anxiety disorder    Patient says she has psychiatry appt upcoming; continue meds for now      Bradycardia    Resolved with lowered dose of beta-blocker; to see cardiologist tomorrow      Bipolar disorder, current episode depressed, severe, with psychotic features ([place]) (Chronic)    Patient reports that she will be seeing psychiatrist soon         Follow up plan: Return in about 3 months (around 09/21/2016) for follow-up.  An after-visit summary was printed and given to the patient at [place].  Please see the patient instructions which may contain other information and recommendations beyond what is mentioned above in the assessment and plan.  No orders of the defined types were placed in this encounter.   No orders of the defined types were placed in this encounter.

## 2016-06-23 NOTE — Assessment & Plan Note (Signed)
Continue the lower dose of labetalol and see cardiologist tomorrow; avoid excess salt

## 2016-06-24 ENCOUNTER — Encounter: Payer: Self-pay | Admitting: Internal Medicine

## 2016-06-24 ENCOUNTER — Ambulatory Visit (INDEPENDENT_AMBULATORY_CARE_PROVIDER_SITE_OTHER): Payer: Medicare Other | Admitting: Internal Medicine

## 2016-06-24 ENCOUNTER — Ambulatory Visit (INDEPENDENT_AMBULATORY_CARE_PROVIDER_SITE_OTHER): Payer: Medicare Other

## 2016-06-24 ENCOUNTER — Other Ambulatory Visit: Payer: Self-pay | Admitting: Family Medicine

## 2016-06-24 VITALS — BP 140/72 | HR 68 | Ht 62.0 in | Wt 126.0 lb

## 2016-06-24 DIAGNOSIS — E785 Hyperlipidemia, unspecified: Secondary | ICD-10-CM | POA: Diagnosis not present

## 2016-06-24 DIAGNOSIS — R001 Bradycardia, unspecified: Secondary | ICD-10-CM

## 2016-06-24 DIAGNOSIS — F1910 Other psychoactive substance abuse, uncomplicated: Secondary | ICD-10-CM

## 2016-06-24 DIAGNOSIS — R55 Syncope and collapse: Secondary | ICD-10-CM | POA: Diagnosis not present

## 2016-06-24 DIAGNOSIS — Z5181 Encounter for therapeutic drug level monitoring: Secondary | ICD-10-CM

## 2016-06-24 DIAGNOSIS — R002 Palpitations: Secondary | ICD-10-CM | POA: Diagnosis not present

## 2016-06-24 DIAGNOSIS — R079 Chest pain, unspecified: Secondary | ICD-10-CM | POA: Diagnosis not present

## 2016-06-24 DIAGNOSIS — R52 Pain, unspecified: Secondary | ICD-10-CM

## 2016-06-24 DIAGNOSIS — R0602 Shortness of breath: Secondary | ICD-10-CM | POA: Diagnosis not present

## 2016-06-24 LAB — CBC WITH DIFFERENTIAL/PLATELET
BASOS ABS: 45 {cells}/uL (ref 0–200)
Basophils Relative: 1 %
Eosinophils Absolute: 180 cells/uL (ref 15–500)
Eosinophils Relative: 4 %
HEMATOCRIT: 40.5 % (ref 35.0–45.0)
HEMOGLOBIN: 13.2 g/dL (ref 11.7–15.5)
LYMPHS ABS: 2160 {cells}/uL (ref 850–3900)
Lymphocytes Relative: 48 %
MCH: 30.3 pg (ref 27.0–33.0)
MCHC: 32.6 g/dL (ref 32.0–36.0)
MCV: 93.1 fL (ref 80.0–100.0)
MONO ABS: 630 {cells}/uL (ref 200–950)
MONOS PCT: 14 %
MPV: 9.7 fL (ref 7.5–12.5)
NEUTROS ABS: 1485 {cells}/uL — AB (ref 1500–7800)
Neutrophils Relative %: 33 %
Platelets: 287 10*3/uL (ref 140–400)
RBC: 4.35 MIL/uL (ref 3.80–5.10)
RDW: 13.6 % (ref 11.0–15.0)
WBC: 4.5 10*3/uL (ref 3.8–10.8)

## 2016-06-24 LAB — TOPIRAMATE LEVEL: Topiramate Lvl: 9.7 ug/mL

## 2016-06-24 MED ORDER — ATORVASTATIN CALCIUM 20 MG PO TABS: 20.0000 mg | ORAL_TABLET | Freq: Every day | ORAL | 1 refills | Status: DC

## 2016-06-24 NOTE — Patient Instructions (Addendum)
Medication Instructions:  Continue current medications.   Labwork: - None ordered.   Testing/Procedures: Your physician has requested that you have an echocardiogram. Echocardiography is a painless test that uses sound waves to create images of your heart. It provides your doctor with information about the size and shape of your heart and how well your heart's chambers and valves are working. This procedure takes approximately one hour. There are no restrictions for this procedure.  Your physician has recommended that you wear an 30 DAY EVENT event monitor. Event monitors are medical devices that record the heart's electrical activity. Doctors most often [place] these monitors to diagnose arrhythmias. Arrhythmias are problems with the speed or rhythm of the heartbeat. The monitor is a small, portable device. You can wear one while you do your normal daily activities. This is usually used to diagnose what is causing palpitations/syncope (passing out). - PREVENTICE MONITOR WILL BE MAILED TO YOUR HOUSE.   Your physician has requested that you have a lexiscan myoview. For further information please visit [email or web address]. Please follow instruction sheet, as given.   [place]  Your caregiver has ordered a Stress Test with nuclear imaging. The purpose of this test is to evaluate the blood supply to your heart muscle. This procedure is referred to as a "Non-Invasive Stress Test." This is because other than having an IV started in your vein, nothing is inserted or "invades" your body. Cardiac stress tests are done to find areas of poor blood flow to the heart by determining the extent of coronary artery disease (CAD). Some patients exercise on a treadmill, which naturally increases the blood flow to your heart, while others who are  unable to walk on a treadmill due to physical limitations have a pharmacologic/chemical stress agent called Lexiscan . This medicine will mimic walking on a treadmill by  temporarily increasing your coronary blood flow.   Please note: these test may take anywhere between 2-4 hours to complete  PLEASE REPORT TO [place] AT THE FIRST DESK WILL DIRECT YOU WHERE TO GO  Date of Procedure:______        07/03/16    ___________  Arrival Time for Procedure:________07:15 AM    ____________  Instructions regarding medication:   _YES_ :  Hold LABETATOL night before procedure and morning of procedure  You may take your other normal morning medications with a small sip of water.    PLEASE NOTIFY THE OFFICE AT LEAST 46 HOURS IN ADVANCE IF YOU ARE UNABLE TO KEEP YOUR APPOINTMENT.  [phone] AND  PLEASE NOTIFY NUCLEAR MEDICINE AT [facility] AT LEAST 24 HOURS IN ADVANCE IF YOU ARE UNABLE TO KEEP YOUR APPOINTMENT. [phone]  How to prepare for your Myoview test:  1. Do not eat or drink after midnight 2. No caffeine for 24 hours prior to test 3. No smoking 24 hours prior to test. 4. Your medication may be taken with water.  If your doctor stopped a medication because of this test, do not take that medication. 5. Ladies, please do not wear dresses.  Skirts or pants are appropriate. Please wear a short sleeve shirt. 6. No perfume, cologne or lotion. 7. Wear comfortable walking shoes. No heels!    Follow-Up: Your physician recommends that you schedule a follow-up appointment in: [place].   If you need a refill on your cardiac medications before your next appointment, please call your pharmacy.  Cardiac Nuclear Scanning A cardiac nuclear scan is used to  check your heart for problems, such as the following:  A portion of the heart is not getting enough blood.  Part of the heart muscle has died, which happens with a heart attack.  The heart wall is not working normally.  In this test, a radioactive dye (tracer) is injected into your bloodstream. After the tracer has traveled to your heart, a scanning device is used  to measure how much of the tracer is absorbed by or distributed to various areas of your heart. LET [facility] CARE PROVIDER KNOW ABOUT:  Any allergies you have.  All medicines you are taking, including vitamins, herbs, eye drops, creams, and over-the-counter medicines.  Previous problems you or members of your family have had with the use of anesthetics.  Any blood disorders you have.  Previous surgeries you have had.  Medical conditions you have.  RISKS AND COMPLICATIONS Generally, this is a safe procedure. However, as with any procedure, problems can occur. Possible problems include:   Serious chest pain.  Rapid heartbeat.  Sensation of warmth in your chest. This usually passes quickly. BEFORE THE PROCEDURE Ask your health care provider about changing or stopping your regular medicines. PROCEDURE This procedure is usually done at a hospital and takes 2-4 hours.  An IV tube is inserted into one of your veins.  Your health care provider will inject a small amount of radioactive tracer through the tube.  You will then wait for 20-40 minutes while the tracer travels through your bloodstream.  You will lie down on an exam table so images of your heart can be taken. Images will be taken for about 15-20 minutes.  You will exercise on a treadmill or stationary bike. While you exercise, your heart activity will be monitored with an electrocardiogram (ECG), and your blood pressure will be checked.  If you are unable to exercise, you may be given a medicine to make your heart beat faster.  When blood flow to your heart has peaked, tracer will again be injected through the IV tube.  After 20-40 minutes, you will get back on the exam table and have more images taken of your heart.  When the procedure is over, your IV tube will be removed. AFTER THE PROCEDURE  You will likely be able to leave shortly after the test. Unless your health care provider tells you otherwise, you may  return to your normal schedule, including diet, activities, and medicines.  Make sure you find out how and when you will get your test results. This information is not intended to replace advice given to you by your health care provider. Make sure you discuss any questions you have with your health care provider. Document Released: 06/26/2004 Document Revised: 06/06/2013 Document Reviewed: 05/10/2013 Elsevier Interactive Patient Education  2017 [place]. Echocardiogram An echocardiogram, or echocardiography, uses sound waves (ultrasound) to produce an image of your heart. The echocardiogram is simple, painless, obtained within a short period of time, and offers valuable information to your health care provider. The images from an echocardiogram can provide information such as:  Evidence of coronary artery disease (CAD).  Heart size.  Heart muscle function.  Heart valve function.  Aneurysm detection.  Evidence of a past heart attack.  Fluid buildup around the heart.  Heart muscle thickening.  Assess heart valve function. Tell a health care provider about:  Any allergies you have.  All medicines you are taking, including vitamins, herbs, eye drops, creams, and over-the-counter medicines.  Any problems you or family  members have had with anesthetic medicines.  Any blood disorders you have.  Any surgeries you have had.  Any medical conditions you have.  Whether you are pregnant or may be pregnant. What happens before the procedure? No special preparation is needed. Eat and drink normally. What happens during the procedure?  In order to produce an image of your heart, gel will be applied to your chest and a wand-like tool (transducer) will be moved over your chest. The gel will help transmit the sound waves from the transducer. The sound waves will harmlessly bounce off your heart to allow the heart images to be captured in real-time motion. These images will then be  recorded.  You may need an IV to receive a medicine that improves the quality of the pictures. What happens after the procedure? You may return to your normal schedule including diet, activities, and medicines, unless your health care provider tells you otherwise. This information is not intended to replace advice given to you by your health care provider. Make sure you discuss any questions you have with your health care provider. Document Released: 05/29/2000 Document Revised: 01/18/2016 Document Reviewed: 02/06/2013 Elsevier Interactive Patient Education  2017 [employer].

## 2016-06-24 NOTE — Assessment & Plan Note (Signed)
High cholesterol; watch diet, start statin; recheck lipids in 6 weeks

## 2016-06-24 NOTE — Assessment & Plan Note (Signed)
Check sgpt in 6 weeks 

## 2016-06-24 NOTE — Progress Notes (Signed)
Start statin; check fasting lipids and sgpt in 6 weeks; note to pt through [employer]

## 2016-06-24 NOTE — Progress Notes (Signed)
New Outpatient Visit Date: 06/24/2016  Referring Provider: Arnetha Courser, MD [address] [place], [place] [address]  Chief Complaint: Slow heart rate and passing out  HPI:  [name] is a 61 y.o. year-old female with history of hypertension, hyperlipidemia, diabetes mellitus (diet controlled), anxiety/depression, meningioma involving the left optic nerve, and chronic pain with suspected fibromyalgia, who has been referred by Dr. Sanda Klein for evaluation of bradycardia. The history is obtained with the assistance of a Spanish interpreter. Her husband also contribute significantly to the history. The patient's primary concern is of severe pain radiating from her jaw throughout her body that she attributes to tooth problems. She was evaluated by a dentist and oral surgeon recently and was advised that she needs to undergo a tooth extraction root canal. She was treated with a course of antibiotics for possible infection. However, she continues to have severe pain associated with air flowing across her teeth. The pain often incapacitates her for minutes at a time. She has tried taking Percocet in the past but does not like using this because of medication side effects. Aside from these episodes, the patient does not have any chest pain or palpitations. The jaw pain is associated with shortness of breath that the patient describes as "the air leaving me."  The patient's husband is most concerned about multiple episodes of passing out. He initially states that the episodes occur several times per week, most recently 2 days ago. However, the patient and her husband later say that the episodes only occur about once a month. [name] has frequent panic attacks that are difficult to distinguish from her syncopal or near syncopal episodes. At times she will be walking around her home and suddenly fall down. Most recently, she passed out while in her husband's arms and was unconscious for about 1-2  minutes. The patient denies any warning symptoms including palpitations, lightheadedness, and chest pain. Upon awakening, she feels back to her usual self. The patient's husband states that [name] was evaluated in the [facility] 2 weeks ago, though there is no record of this in [place]. Upon further questioning, he admits that it may have been several months ago at [facility], where the patient left AMA before further cardiovascular evaluation could be performed. She was told that she had bradycardia at the time and should see a cardiologist. She denies undergoing previous cardiovascular evaluation, though echocardiogram from 2014 is available in our system (see details below).  The patient was evaluated by her PCP as recently as yesterday. At her visit in late December, labetalol was decreased in an effort to minimize bradycardia.  --------------------------------------------------------------------------------------------------  Cardiovascular History & Procedures: Cardiovascular Problems:  Syncope  Bradycardia  Risk Factors:  Diabetes mellitus, hypertension, and hyperlipidemia  Cath/PCI:  None  CV Surgery:  None  EP Procedures and Devices:  None  Non-Invasive Evaluation(s):  Transthoracic echocardiogram (11/25/12): Normal LV size and wall thickness with normal LV contraction (EF 55-60%). Normal diastolic function. No significant valvular abnormalities. Normal RV size and function. Upper normal pulmonary artery systolic pressure. Trivial pericardial effusion.  Recent CV Pertinent Labs: Lab Results  Component Value Date   CHOL 206 (H) 06/23/2016   CHOL 252 (H) 06/05/2015   HDL 36 (L) 06/23/2016   HDL 47 06/05/2015   LDLCALC 151 (H) 06/23/2016   LDLCALC 180 (H) 06/05/2015   LDLDIRECT 207.0 04/27/2013   TRIG 97 06/23/2016   CHOLHDL 5.7 (H) 06/23/2016   K 4.3 06/23/2016   K  3.9 12/10/2013   BUN 20 06/23/2016   BUN 18 06/05/2015   BUN 13 12/10/2013   CREATININE 0.88  06/23/2016   --------------------------------------------------------------------------------------------------  Past Medical History:  Diagnosis Date  . Anxiety   . Arthritis 11/15/2012  . Brain tumor ([place])   . Breast pain   . Depression   . Depression, controlled   . Diabetes mellitus without complication ([place])   . 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 Fibromyalgia   . Hyperlipidemia   . Hypertension   . Hypertension, benign   . Insomnia   . Obesity   . Obesity (BMI 30.0-34.9)   . Pain of both breasts     Past Surgical History:  Procedure Laterality Date  . ABDOMINAL HYSTERECTOMY    . ABDOMINAL HYSTERECTOMY  2010  . BILATERAL CARPAL TUNNEL RELEASE    . BREAST BIOPSY Bilateral 1980   EXCISIONAL - NEG  . BREAST BIOPSY Right 1985   CORE W/OUT CLIP - NEG  . BREAST SURGERY     benign breast cyst  . CARPAL TUNNEL RELEASE    . CHOLECYSTECTOMY  2014  . EYE SURGERY     cataract surger both eyes    Outpatient Encounter Prescriptions as of 06/24/2016  Medication Sig  . amLODipine (NORVASC) 10 MG tablet Take 1 tablet (10 mg total) by mouth daily.  Marland Kitchen atorvastatin (LIPITOR) 20 MG tablet Take 1 tablet (20 mg total) by mouth at bedtime.  . gabapentin (NEURONTIN) 600 MG tablet TAKE 1 TABLET BY MOUTH THREE TIMES DAILY  . labetalol (NORMODYNE) 200 MG tablet TAKE 1 TABLET BY MOUTH TWICE DAILY  . losartan (COZAAR) 50 MG tablet TAKE 1 TABLET BY MOUTH DAILY  . PARoxetine (PAXIL) 30 MG tablet TAKE 1 TABLET(30 MG) BY MOUTH TWICE DAILY  . topiramate (TOPAMAX) 100 MG tablet TAKE 2 TABLETS(200 MG) BY MOUTH AT BEDTIME   No facility-administered encounter medications on file as of 06/24/2016.     Allergies: Aspirin and Penicillins  Social History   Social History  . Marital status: Married    Spouse name: Hessie Diener  . Number of children: 2  . Years of education: 12   Occupational History  . Unemployed    Social History Main Topics  . Smoking status: Current Every Day Smoker     Packs/day: 1.00    Years: 10.00    Types: Cigarettes  . Smokeless tobacco: Never Used  . Alcohol use No     Comment: occasional  . Drug use: No     Comment: smokes MJ 2 times per day  . Sexual activity: Not Currently   Other Topics Concern  . Not on file   Social History Narrative   Lives alone   Caffeine use: Drinks coffee (3 cups per day)   ** Merged History Encounter **        Family History  Problem Relation Age of Onset  . Heart disease Mother   . Diabetes Mother   . Hypertension Mother   . Diabetes Father   . Hypertension Father   . Hyperlipidemia Sister   . Hyperlipidemia Brother   . Heart disease Brother   . Hyperlipidemia Brother   . Hyperlipidemia Brother   . Hyperlipidemia Brother   . Breast cancer Paternal Aunt   . Neuropathy Neg Hx     Review of Systems: A 12-system review of systems was performed and was negative except as noted in the HPI.  --------------------------------------------------------------------------------------------------  Physical Exam: BP 140/72 (BP Location: Left Arm,  Patient Position: Sitting, Cuff Size: Normal)   Pulse 68   Ht 5\' 2"  (1.575 m)   Wt 126 lb (57.2 kg)   LMP  (Approximate)   SpO2 98%   BMI 23.05 kg/m   General:  Well-developed, well-nourished woman seated in the exam room. Multiple times during the encounter, she would scream out in pain, writhing on the exam table. This would cease within a few seconds to minutes and the patient would be back to her baseline. HEENT: No conjunctival pallor or scleral icterus.  Moist mucous membranes.  OP clear. Poor dentition. Neck: Supple without lymphadenopathy, thyromegaly, JVD, or HJR.  No carotid bruit. Lungs: Normal work of breathing.  Clear to auscultation bilaterally without wheezes or crackles. Heart: Regular rate and rhythm without murmurs, rubs, or gallops.  Non-displaced PMI. Abd: Bowel sounds present.  Soft, NT/ND without hepatosplenomegaly Ext: No lower extremity  edema.  Radial, PT, and DP pulses are 2+ bilaterally Skin: warm and dry without rash Neuro: CNIII-XII intact.  Strength and fine-touch sensation intact in upper and lower extremities bilaterally. Psych: Labile affect. Occasional tangential thinking, though assessment is difficult due to use of an interpreter.  EKG:  Normal sinus rhythm with nonspecific T-wave changes and borderline QT prolongation (QTc 456 ms). Compared with prior tracing from 06/03/16, heart rate has increased and nonspecific T-wave changes are new (I have personally reviewed both tracings).  Lab Results  Component Value Date   WBC 4.5 06/23/2016   HGB 13.2 06/23/2016   HCT 40.5 06/23/2016   MCV 93.1 06/23/2016   PLT 287 06/23/2016    Lab Results  Component Value Date   NA 142 06/23/2016   K 4.3 06/23/2016   CL 112 (H) 06/23/2016   CO2 24 06/23/2016   BUN 20 06/23/2016   CREATININE 0.88 06/23/2016   GLUCOSE 88 06/23/2016   ALT 15 06/23/2016    Lab Results  Component Value Date   CHOL 206 (H) 06/23/2016   HDL 36 (L) 06/23/2016   LDLCALC 151 (H) 06/23/2016   LDLDIRECT 207.0 04/27/2013   TRIG 97 06/23/2016   CHOLHDL 5.7 (H) 06/23/2016   Lab Results  Component Value Date   TSH 3.330 06/05/2015   --------------------------------------------------------------------------------------------------  ASSESSMENT AND PLAN: Syncope The description of these events is quite variable. They typically occur without any warning anywhere from several times a week to once a month and last for 1 to 2 minutes. The patient denies any warning signs. She has not suffered any significant injuries from these events either. Cardiac etiology is possible though her exam today is unremarkable. EKG demonstrates borderline QT prolongation and nonspecific T-wave changes. Echocardiogram in 2014 did not show any significant abnormalities. We have agreed to proceed with 30-day cardiac event monitor, transthoracic echocardiogram, and  pharmacologic myocardial perfusion stress test (as the patient would have a difficult time ambulating for an exercise stress test). If this workup is unrevealing, I would favor proceeding with further neuropsychiatric assessment.  Bradycardia Heart rates have previously been low though EKG today demonstrates normal sinus rhythm with a rate of 66 bpm. It is unclear if this could be related to the aforementioned syncope. Patient is on labetalol, though this was decreased recently. We will proceed with cardiac event monitor, echocardiogram, and stress testing, as above.  Pain Patient has numerous pain complaints, centered predominately around her dental issues. She will have episodes of pain that begin in the left side of her jaw and radiate throughout her body. I witnessed several of these  episodes during today's visit. They were quite dramatic with the patient appearing very comfortable one moment and then writhing in pain and crying within a few seconds. She would then return back to her baseline very quickly. It seems as though these episodes have been happening for at least a few months, though it was very difficult to obtain a clear answer from the patient and her husband. She is planning to be seen by another dentist later this month. I would prefer that the patient completed her cardiovascular evaluation mentioned above before undergoing any elective procedure that would require anesthesia. However, dental procedures are typically low risk for perioperative cardiovascular complications and should proceed if there there is concern for progressive infection or other significant complication related to delaying the procedure.  Hyperlipidemia Given the patient's history of diabetes, her LDL is suboptimally controlled. She is currently on moderate intensity intensity statin therapy. I would advocate for escalation to high intensity dosing. We will readdress this when the patient returns for  follow-up.  Substance abuse Patient smokes and uses marijuana. She was encouraged to quit.  Follow-up: Return to clinic in 6 weeks.  Nelva Bush, MD 06/24/2016 6:27 PM

## 2016-06-28 ENCOUNTER — Other Ambulatory Visit: Payer: Self-pay | Admitting: Family Medicine

## 2016-07-01 ENCOUNTER — Ambulatory Visit: Payer: Medicare Other | Admitting: Psychiatry

## 2016-07-01 NOTE — Telephone Encounter (Signed)
Last Cr and K+ normal; Rx for losartan approved I just approved 180 pills of the labetalol on Jun 16, 2016, so note sent to pharmacy

## 2016-07-03 ENCOUNTER — Other Ambulatory Visit: Payer: Self-pay | Admitting: Family Medicine

## 2016-07-03 ENCOUNTER — Encounter: Admission: RE | Admit: 2016-07-03 | Payer: Medicare Other | Source: Ambulatory Visit

## 2016-07-03 DIAGNOSIS — F315 Bipolar disorder, current episode depressed, severe, with psychotic features: Secondary | ICD-10-CM

## 2016-07-04 NOTE — Telephone Encounter (Signed)
I just approved a 6 month supply of paroxetine to this pharmacy on 06/16/16, receipt confirmed by pharmacy Please resolve with pharmacy

## 2016-07-06 ENCOUNTER — Other Ambulatory Visit: Payer: Self-pay | Admitting: Family Medicine

## 2016-07-06 ENCOUNTER — Telehealth: Payer: Self-pay | Admitting: Internal Medicine

## 2016-07-06 DIAGNOSIS — F315 Bipolar disorder, current episode depressed, severe, with psychotic features: Secondary | ICD-10-CM

## 2016-07-06 NOTE — Telephone Encounter (Signed)
Please resolve with pharmacy labetalol (NORMODYNE) 200 MG tablet 180 tablet 0 06/16/2016    Sig: TAKE 1 TABLET BY MOUTH TWICE DAILY   Notes to Pharmacy: **Patient requests 90 days supply**   E-Prescribing Status: Receipt confirmed by pharmacy (06/16/2016 5:39 PM EST)

## 2016-07-06 NOTE — Telephone Encounter (Signed)
Called into pharmacy they stated they did not receive

## 2016-07-06 NOTE — Telephone Encounter (Signed)
Spoke with patients spouse per release form and rescheduled stress test that was canceled last week due to bad weather. Scheduled stress test for 07/15/16 at 07:30AM and reviewed all information with spouse to include medications to hold, instructions not to smoke, and nothing to eat or drink after midnight. He verbalized understanding of all instructions and had no further questions at this time.

## 2016-07-06 NOTE — Telephone Encounter (Signed)
Confirmed with pharmacy.

## 2016-07-06 NOTE — Telephone Encounter (Signed)
Please resolve paroxetine request with pharmacy I approved a 90 day supply plus one refill on 06/16/16' Thank you

## 2016-07-07 ENCOUNTER — Other Ambulatory Visit: Payer: Self-pay | Admitting: Family Medicine

## 2016-07-07 NOTE — Telephone Encounter (Signed)
Called pharmacy and resolved

## 2016-07-07 NOTE — Telephone Encounter (Signed)
We've received yet another request for labetalol; can you please contact pharmacy and have them stop sending these requests? Thank you I approved a 90 day supply so we shouldn't be getting any more requests

## 2016-07-07 NOTE — Telephone Encounter (Signed)
Left voicemail with pharmacy 

## 2016-07-08 NOTE — Assessment & Plan Note (Signed)
Patient reports that she will be seeing psychiatrist soon

## 2016-07-08 NOTE — Assessment & Plan Note (Signed)
Resolved with lowered dose of beta-blocker; to see cardiologist tomorrow

## 2016-07-08 NOTE — Assessment & Plan Note (Signed)
Patient says she has psychiatry appt upcoming; continue meds for now

## 2016-07-13 ENCOUNTER — Other Ambulatory Visit: Payer: Self-pay

## 2016-07-13 ENCOUNTER — Ambulatory Visit (INDEPENDENT_AMBULATORY_CARE_PROVIDER_SITE_OTHER): Payer: Medicare Other

## 2016-07-13 DIAGNOSIS — R55 Syncope and collapse: Secondary | ICD-10-CM | POA: Diagnosis not present

## 2016-07-13 DIAGNOSIS — R001 Bradycardia, unspecified: Secondary | ICD-10-CM | POA: Diagnosis not present

## 2016-07-13 LAB — ECHOCARDIOGRAM COMPLETE
CHL CUP MV DEC (S): 177
CHL CUP TV REG PEAK VELOCITY: 164 cm/s
E decel time: 177 msec
E/e' ratio: 12.11
FS: 38 % (ref 28–44)
IV/PV OW: 0.73
LA diam end sys: 32 mm
LA vol A4C: 42.2 ml
LADIAMINDEX: 2.04 cm/m2
LASIZE: 32 mm
LAVOL: 34.8 mL
LAVOLIN: 22.2 mL/m2
LDCA: 2.54 cm2
LV E/e' medial: 12.11
LV E/e'average: 12.11
LV sys vol index: 15 mL/m2
LV sys vol: 23 mL (ref 14–42)
LVDIAVOL: 76 mL (ref 46–106)
LVDIAVOLIN: 48 mL/m2
LVELAT: 9.41 cm/s
LVOT VTI: 26.9 cm
LVOT diameter: 18 mm
LVOT peak vel: 110 cm/s
LVOTSV: 68 mL
Lateral S' vel: 11.7 cm/s
MV pk A vel: 111 m/s
MV pk E vel: 114 m/s
MVPG: 5 mmHg
PW: 10.7 mm — AB (ref 0.6–1.1)
RV sys press: 14 mmHg
Simpson's disk: 70
Stroke v: 53 ml
TAPSE: 27.8 mm
TDI e' lateral: 9.41
TDI e' medial: 6.32
TR max vel: 164 cm/s

## 2016-07-14 ENCOUNTER — Telehealth: Payer: Self-pay | Admitting: *Deleted

## 2016-07-14 NOTE — Telephone Encounter (Signed)
S/w with patient's husband, ok per DPR. He verbalized understanding to [facility] tomorrow for patient. He verbalized understanding of pretesting instructions as noted on AVS from last office visit.

## 2016-07-15 ENCOUNTER — Encounter
Admission: RE | Admit: 2016-07-15 | Discharge: 2016-07-15 | Disposition: A | Discharge status: Home / Self Care | Payer: Medicare Other | Source: Ambulatory Visit | Attending: Internal Medicine | Admitting: Internal Medicine

## 2016-07-15 DIAGNOSIS — E785 Hyperlipidemia, unspecified: Secondary | ICD-10-CM | POA: Insufficient documentation

## 2016-07-15 DIAGNOSIS — R001 Bradycardia, unspecified: Secondary | ICD-10-CM | POA: Diagnosis not present

## 2016-07-15 DIAGNOSIS — R55 Syncope and collapse: Secondary | ICD-10-CM | POA: Diagnosis not present

## 2016-07-15 LAB — NM MYOCAR MULTI W/SPECT W/WALL MOTION / EF
CHL CUP NUCLEAR SRS: 0
CHL CUP RESTING HR STRESS: 53 {beats}/min
CHL CUP STRESS STAGE 1 HR: 56 {beats}/min
CHL CUP STRESS STAGE 3 GRADE: 0 %
CHL CUP STRESS STAGE 3 HR: 55 {beats}/min
CHL CUP STRESS STAGE 4 GRADE: 0 %
CHL CUP STRESS STAGE 4 HR: 75 {beats}/min
CHL CUP STRESS STAGE 6 DBP: 53 mmHg
CHL CUP STRESS STAGE 6 GRADE: 0 %
CHL CUP STRESS STAGE 6 HR: 78 {beats}/min
CHL CUP STRESS STAGE 6 SBP: 109 mmHg
CHL CUP STRESS STAGE 6 SPEED: 0 mph
Estimated workload: 1 METS
LV sys vol: 27 mL
LVDIAVOL: 86 mL (ref 46–106)
Peak HR: 75 {beats}/min
Percent HR: 50 %
Percent of predicted max HR: 46 %
SDS: 0
SSS: 0
Stage 2 Grade: 0 %
Stage 2 HR: 55 {beats}/min
Stage 2 Speed: 0 mph
Stage 3 Speed: 0 mph
Stage 4 Speed: 0 mph
Stage 5 Grade: 0 %
Stage 5 HR: 76 {beats}/min
Stage 5 Speed: 0 mph
TID: 1.07

## 2016-07-15 MED ORDER — TECHNETIUM TC 99M TETROFOSMIN IV KIT
32.5000 | PACK | Freq: Once | INTRAVENOUS | Status: AC | PRN
  Administered 2016-07-15: 32.5 via INTRAVENOUS

## 2016-07-15 MED ORDER — REGADENOSON 0.4 MG/5ML IV SOLN
0.4000 mg | Freq: Once | INTRAVENOUS | Status: AC
  Administered 2016-07-15: 0.4 mg via INTRAVENOUS

## 2016-07-15 MED ORDER — TECHNETIUM TC 99M TETROFOSMIN IV KIT
13.3100 | PACK | Freq: Once | INTRAVENOUS | Status: AC | PRN
  Administered 2016-07-15: 13.31 via INTRAVENOUS

## 2016-07-16 ENCOUNTER — Other Ambulatory Visit: Payer: Self-pay | Admitting: Family Medicine

## 2016-07-16 DIAGNOSIS — G43009 Migraine without aura, not intractable, without status migrainosus: Secondary | ICD-10-CM

## 2016-07-18 ENCOUNTER — Telehealth: Payer: Self-pay | Admitting: Family Medicine

## 2016-07-18 NOTE — Telephone Encounter (Signed)
Please see the breast imaging report from February 13, 2016 There is discrepancy, and I need to have the radiology review the films again and make an addendum to the report regarding LEFT or RIGHT breast containing the masses Then please order appropriate imaging, which will be due around August 13, 2016 Thank you

## 2016-07-20 NOTE — Telephone Encounter (Signed)
Called [facility] spoke with Roselyn Reef they are going to print off and do addendum and let us know exactly what needs to be ordered.

## 2016-07-22 NOTE — Addendum Note (Signed)
Addended by: Johnnette Litter A on: 07/22/2016 03:52 PM   Modules accepted: Orders

## 2016-07-23 DIAGNOSIS — R001 Bradycardia, unspecified: Secondary | ICD-10-CM | POA: Diagnosis not present

## 2016-07-23 DIAGNOSIS — R55 Syncope and collapse: Secondary | ICD-10-CM

## 2016-07-28 ENCOUNTER — Other Ambulatory Visit: Payer: Self-pay

## 2016-07-28 ENCOUNTER — Telehealth: Payer: Self-pay | Admitting: Family Medicine

## 2016-07-28 DIAGNOSIS — N6320 Unspecified lump in the left breast, unspecified quadrant: Secondary | ICD-10-CM

## 2016-07-28 NOTE — Telephone Encounter (Signed)
Ordered

## 2016-07-28 NOTE — Telephone Encounter (Signed)
Please see previous phone note: They have clarified / corrected previous imaging Please order imaging  ADDENDUM REPORT: 07/27/2016 12:15  ADDENDUM: Correction to the laterality of the masses in the original report. ALL of the masses described are in the LEFT breast. The right breast was not evaluated. Therefore, a bilateral diagnostic mammogram and LEFT breast ultrasound is recommended in 6 months from the exam.   Electronically Signed   By: Ammie Ferrier M.D.   On: 07/27/2016 12:15

## 2016-08-02 ENCOUNTER — Other Ambulatory Visit: Payer: Self-pay | Admitting: Family Medicine

## 2016-08-06 ENCOUNTER — Telehealth: Payer: Self-pay | Admitting: Family Medicine

## 2016-08-06 DIAGNOSIS — N6320 Unspecified lump in the left breast, unspecified quadrant: Secondary | ICD-10-CM

## 2016-08-06 NOTE — Telephone Encounter (Signed)
Ordered  Orders Placed This Encounter  Procedures  . MM Digital Diagnostic Bilat    Standing Status:   Future    Standing Expiration Date:   10/04/2017    Order Specific Question:   Reason for Exam (SYMPTOM  OR DIAGNOSIS REQUIRED)    Answer:   abnormal breast imaging    Order Specific Question:   Is the patient pregnant?    Answer:   No    Order Specific Question:   Preferred imaging location?    Answer:   [facility]  . US BREAST LTD UNI RIGHT INC AXILLA    Standing Status:   Future    Standing Expiration Date:   10/04/2017    Order Specific Question:   Reason for Exam (SYMPTOM  OR DIAGNOSIS REQUIRED)    Answer:   abnormal breast imaging    Order Specific Question:   Preferred imaging location?    Answer:   [facility]

## 2016-08-06 NOTE — Assessment & Plan Note (Signed)
Abnormal breast imaging; order f/u studies per rads rec

## 2016-08-06 NOTE — Telephone Encounter (Signed)
-----   Message from Arnetha Courser, MD sent at 02/13/2016 10:32 AM EDT ----- Regarding: 6 month breast imaging due in March 2018 Due first of March, 2018  RECOMMENDATION: Six-month follow-up bilateral diagnostic mammogram and right breast ultrasound is recommended to ensure stability of the right breast mass at 1 o'clock, 6 cm from the nipple.

## 2016-08-11 ENCOUNTER — Ambulatory Visit: Payer: Medicare Other | Admitting: Internal Medicine

## 2016-08-12 ENCOUNTER — Ambulatory Visit (INDEPENDENT_AMBULATORY_CARE_PROVIDER_SITE_OTHER): Payer: Medicare Other | Admitting: Internal Medicine

## 2016-08-12 ENCOUNTER — Encounter: Payer: Self-pay | Admitting: Internal Medicine

## 2016-08-12 VITALS — BP 136/66 | HR 67 | Ht 62.0 in | Wt 137.5 lb

## 2016-08-12 DIAGNOSIS — R001 Bradycardia, unspecified: Secondary | ICD-10-CM

## 2016-08-12 DIAGNOSIS — E785 Hyperlipidemia, unspecified: Secondary | ICD-10-CM | POA: Diagnosis not present

## 2016-08-12 DIAGNOSIS — R55 Syncope and collapse: Secondary | ICD-10-CM | POA: Diagnosis not present

## 2016-08-12 NOTE — Patient Instructions (Signed)
Medication Instructions:  Your physician has recommended you make the following change in your medication:  1- STOP taking Labetolol.   Labwork: none  Testing/Procedures: none  Follow-Up: Your physician recommends that you schedule a follow-up appointment on AS NEEDED BASIS.

## 2016-08-12 NOTE — Progress Notes (Signed)
Follow-up Outpatient Visit Date: 08/12/2016  Primary Care Provider: Enid Derry, MD [address] [place] [address]  Chief Complaint: Follow-up syncope and bradycardia  HPI:  [name] is a 61 y.o. year-old female with history of hypertension, hyperlipidemia, diabetes mellitus (diet controlled), anxiety/depression, meningioma involving the left optic nerve, and chronic pain with suspected fibromyalgia, who presents for follow-up of syncope. I last saw her on 06/24/16. History is obtained with the assistance of a Spanish interpreter. Since our last visit, [name] has not had any more episodes of lightheadedness or syncope. She also denies chest pain, shortness of breath, and palpitations. She continues to have some dental pain, but notes that it has improved. She is awaiting further dental treatments. She reports some difficulty sleeping at night and was once told that she may need a sleep study. This was never performed. Stress test, echo, and event monitor were performed and reviewed with the patient and her husband (see below).  --------------------------------------------------------------------------------------------------  Cardiovascular History & Procedures: Cardiovascular Problems:  Syncope  Bradycardia  Risk Factors:  Diabetes mellitus, hypertension, and hyperlipidemia  Cath/PCI:  None  CV Surgery:  None  EP Procedures and Devices:  30-day event monitor (06/24/16): Predominant rhythm was sinus (average rate 61 bpm; range 44-131 bpm). No significant arrhythmias or prolonged pauses. Patient reported symptoms correspond to sinus rhythm and sinus rhythm with artifact.  Non-Invasive Evaluation(s):  TTE (07/13/16): Normal LV size and wall thickeness. LVEF 60-65% with normal wall motion and diastolic function. Normal RV size and function. No significant valvular abnormalities.  Pharmacologic myocardial perfusion stress test (07/15/16): Low risk  study without ischemia; some gut uptake notes. LVEF normal.  Transthoracic echocardiogram (11/25/12): Normal LV size and wall thickness with normal LV contraction (EF 55-60%). Normal diastolic function. No significant valvular abnormalities. Normal RV size and function. Upper normal pulmonary artery systolic pressure. Trivial pericardial effusion.  Recent CV Pertinent Labs: Lab Results  Component Value Date   CHOL 206 (H) 06/23/2016   CHOL 252 (H) 06/05/2015   HDL 36 (L) 06/23/2016   HDL 47 06/05/2015   LDLCALC 151 (H) 06/23/2016   LDLCALC 180 (H) 06/05/2015   LDLDIRECT 207.0 04/27/2013   TRIG 97 06/23/2016   CHOLHDL 5.7 (H) 06/23/2016   K 4.3 06/23/2016   K 3.9 12/10/2013   BUN 20 06/23/2016   BUN 18 06/05/2015   BUN 13 12/10/2013   CREATININE 0.88 06/23/2016    Past medical and surgical history were reviewed and updated in EPIC.  Outpatient Encounter Prescriptions as of 08/12/2016  Medication Sig  . amLODipine (NORVASC) 10 MG tablet Take 1 tablet (10 mg total) by mouth daily.  Marland Kitchen gabapentin (NEURONTIN) 600 MG tablet TAKE 1 TABLET BY MOUTH THREE TIMES DAILY  . labetalol (NORMODYNE) 200 MG tablet TAKE 1 TABLET BY MOUTH TWICE DAILY  . losartan (COZAAR) 50 MG tablet TAKE 1 TABLET BY MOUTH DAILY  . PARoxetine (PAXIL) 30 MG tablet TAKE 1 TABLET(30 MG) BY MOUTH TWICE DAILY  . topiramate (TOPAMAX) 100 MG tablet TAKE 2 TABLETS(200 MG) BY MOUTH AT BEDTIME   No facility-administered encounter medications on file as of 08/12/2016.     Allergies: Aspirin and Penicillins  Social History   Social History  . Marital status: Married    Spouse name: Hessie Diener  . Number of children: 2  . Years of education: 12   Occupational History  . Unemployed    Social History Main Topics  . Smoking status: Current Every Day Smoker  Packs/day: 1.00    Years: 20.00    Types: Cigarettes  . Smokeless tobacco: Never Used  . Alcohol use No     Comment: occasional  . Drug use: Yes     Types: Marijuana     Comment: smokes MJ 2 times per day  . Sexual activity: Not Currently   Other Topics Concern  . Not on file   Social History Narrative   Lives alone   Caffeine use: Drinks coffee (3 cups per day)   ** Merged History Encounter **        Family History  Problem Relation Age of Onset  . Heart disease Mother   . Diabetes Mother   . Hypertension Mother   . Diabetes Father   . Hypertension Father   . Hyperlipidemia Sister   . Hyperlipidemia Brother   . Heart disease Brother   . Hyperlipidemia Brother   . Hyperlipidemia Brother   . Hyperlipidemia Brother   . Breast cancer Paternal Aunt   . Neuropathy Neg Hx     Review of Systems: A 12-system review of systems was performed and was negative except as noted in the HPI.  --------------------------------------------------------------------------------------------------  Physical Exam: BP 136/66 (BP Location: Left Arm, Patient Position: Sitting, Cuff Size: Normal)   Pulse 67   Ht 5\' 2"  (1.575 m)   Wt 137 lb 8 oz (62.4 kg)   BMI 25.15 kg/m   General:  Well-developed, well-nourished woman, seated comfortably in the exam room. HEENT: No conjunctival pallor or scleral icterus.  Moist mucous membranes.  OP clear. Neck: Supple without lymphadenopathy, thyromegaly, JVD, or HJR. Lungs: Normal work of breathing.  Clear to auscultation bilaterally without wheezes or crackles. Heart: Regular rate and rhythm without murmurs, rubs, or gallops.  Non-displaced PMI. Abd: Bowel sounds present.  Soft, NT/ND without hepatosplenomegaly Ext: No lower extremity edema.  Radial, PT, and DP pulses are 2+ bilaterally. Skin: warm and dry without rash  Lab Results  Component Value Date   WBC 4.5 06/23/2016   HGB 13.2 06/23/2016   HCT 40.5 06/23/2016   MCV 93.1 06/23/2016   PLT 287 06/23/2016    Lab Results  Component Value Date   NA 142 06/23/2016   K 4.3 06/23/2016   CL 112 (H) 06/23/2016   CO2 24 06/23/2016   BUN 20  06/23/2016   CREATININE 0.88 06/23/2016   GLUCOSE 88 06/23/2016   ALT 15 06/23/2016    Lab Results  Component Value Date   CHOL 206 (H) 06/23/2016   HDL 36 (L) 06/23/2016   LDLCALC 151 (H) 06/23/2016   LDLDIRECT 207.0 04/27/2013   TRIG 97 06/23/2016   CHOLHDL 5.7 (H) 06/23/2016    --------------------------------------------------------------------------------------------------  ASSESSMENT AND PLAN: Syncope No clear cardiac etiology. Event monitor showed overall relatively low heart rate but appropriate heart rate response with sinus rates up to 131 bpm. Echo and stress test were unrevealing. I have an overall low suspicion for cardiac cause of her syncope. If episodes were to recur in the future, I would recommend EP consultation to discuss tilt-table testing and loop recorder placement.  Bradycardia Heart rate noted to be relatively low during monitored period, particularly at night. We have agreed to discontinue labetalol. I recommend that the patient speak with her PCP regarding a sleep study, as underlying sleep apnea could be contributing to her symptoms.  Dyslipidemia Low HDL and mildly elevated LDL noted on prior lipid panels. Given history of diabetes (albeit diet controlled), I recommend consideration of moderate-intensity  statin. I will defer this decision to Dr. Sanda Klein.  Follow-up: Return to clinic as needed.  Nelva Bush, MD 08/13/2016 7:23 AM

## 2016-08-13 ENCOUNTER — Encounter: Payer: Self-pay | Admitting: Internal Medicine

## 2016-08-13 ENCOUNTER — Other Ambulatory Visit: Payer: Self-pay | Admitting: Family Medicine

## 2016-08-13 DIAGNOSIS — G478 Other sleep disorders: Secondary | ICD-10-CM

## 2016-08-13 DIAGNOSIS — G4730 Sleep apnea, unspecified: Secondary | ICD-10-CM

## 2016-08-13 NOTE — Assessment & Plan Note (Signed)
Refer for consideration of sleep study, see note by Dr. Saunders Revel, cardiologist

## 2016-08-13 NOTE — Assessment & Plan Note (Signed)
Documented back in 2013 by another clinic, but only as "suspicion"; refer for consideration of sleep study

## 2016-08-26 ENCOUNTER — Encounter: Payer: Self-pay | Admitting: Psychiatry

## 2016-08-26 ENCOUNTER — Ambulatory Visit (INDEPENDENT_AMBULATORY_CARE_PROVIDER_SITE_OTHER): Payer: 59 | Admitting: Psychiatry

## 2016-08-26 VITALS — BP 137/79 | HR 105 | Temp 99.2°F | Wt 130.4 lb

## 2016-08-26 DIAGNOSIS — F4323 Adjustment disorder with mixed anxiety and depressed mood: Secondary | ICD-10-CM | POA: Diagnosis not present

## 2016-08-26 DIAGNOSIS — F602 Antisocial personality disorder: Secondary | ICD-10-CM
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
## 2016-09-06 ENCOUNTER — Other Ambulatory Visit: Payer: Self-pay | Admitting: Family Medicine

## 2016-09-15 ENCOUNTER — Other Ambulatory Visit: Payer: Self-pay | Admitting: Family Medicine

## 2016-09-15 DIAGNOSIS — G43009 Migraine without aura, not intractable, without status migrainosus: Secondary | ICD-10-CM

## 2016-09-15 NOTE — Telephone Encounter (Signed)
Patient's husband notified  

## 2016-09-15 NOTE — Telephone Encounter (Signed)
Please give patient a gentle reminder that she is due for fasting labs I'll refill the topamax

## 2016-09-25 ENCOUNTER — Other Ambulatory Visit: Payer: Self-pay

## 2016-09-25 DIAGNOSIS — Z5181 Encounter for therapeutic drug level monitoring: Secondary | ICD-10-CM

## 2016-09-25 DIAGNOSIS — E785 Hyperlipidemia, unspecified: Secondary | ICD-10-CM

## 2016-09-25 LAB — ALT: ALT: 19 U/L (ref 6–29)

## 2016-09-25 LAB — LIPID PANEL
Cholesterol: 217 mg/dL — ABNORMAL HIGH (ref <200)
HDL: 41 mg/dL — AB (ref >50)
LDL CALC: 157 mg/dL — AB (ref <100)
Total CHOL/HDL Ratio: 5.3 Ratio — ABNORMAL HIGH (ref <5.0)
Triglycerides: 96 mg/dL (ref <150)
VLDL: 19 mg/dL (ref <30)

## 2016-09-26 ENCOUNTER — Other Ambulatory Visit: Payer: Self-pay | Admitting: Family Medicine

## 2016-09-26 DIAGNOSIS — Z5181 Encounter for therapeutic drug level monitoring: Secondary | ICD-10-CM

## 2016-09-26 DIAGNOSIS — E785 Hyperlipidemia, unspecified: Secondary | ICD-10-CM

## 2016-09-26 MED ORDER — ATORVASTATIN CALCIUM 20 MG PO TABS: 20.0000 mg | ORAL_TABLET | Freq: Every day | ORAL | 1 refills | Status: DC

## 2016-09-26 NOTE — Progress Notes (Signed)
Atorvastatin Rx; note to patient to schedule appt for 6 weeks from now, come fasting and we'll get labs

## 2016-10-08 ENCOUNTER — Ambulatory Visit (INDEPENDENT_AMBULATORY_CARE_PROVIDER_SITE_OTHER): Payer: Medicare Other | Admitting: Family Medicine

## 2016-10-08 ENCOUNTER — Encounter: Payer: Self-pay | Admitting: Family Medicine

## 2016-10-08 ENCOUNTER — Other Ambulatory Visit: Payer: Self-pay | Admitting: Family Medicine

## 2016-10-08 VITALS — BP 126/86 | HR 76 | Temp 98.0°F | Resp 14 | Wt 130.6 lb

## 2016-10-08 DIAGNOSIS — M797 Fibromyalgia: Secondary | ICD-10-CM | POA: Diagnosis not present

## 2016-10-08 DIAGNOSIS — F319 Bipolar disorder, unspecified: Secondary | ICD-10-CM

## 2016-10-08 DIAGNOSIS — M791 Myalgia: Secondary | ICD-10-CM | POA: Diagnosis not present

## 2016-10-08 DIAGNOSIS — J069 Acute upper respiratory infection, unspecified: Secondary | ICD-10-CM | POA: Diagnosis not present

## 2016-10-08 DIAGNOSIS — M7918 Myalgia, other site: Secondary | ICD-10-CM

## 2016-10-08 DIAGNOSIS — F3130 Bipolar disorder, current episode depressed, mild or moderate severity, unspecified: Secondary | ICD-10-CM

## 2016-10-08 DIAGNOSIS — E119 Type 2 diabetes mellitus without complications: Secondary | ICD-10-CM | POA: Diagnosis not present

## 2016-10-08 DIAGNOSIS — G629 Polyneuropathy, unspecified: Secondary | ICD-10-CM

## 2016-10-08 LAB — POCT GLYCOSYLATED HEMOGLOBIN (HGB A1C): HEMOGLOBIN A1C: 5.4

## 2016-10-08 MED ORDER — BENZONATATE 100 MG PO CAPS: 100.0000 mg | ORAL_CAPSULE | Freq: Three times a day (TID) | ORAL | 0 refills | Status: DC | PRN

## 2016-10-08 MED ORDER — DULOXETINE HCL 30 MG PO CPEP: 30.0000 mg | ORAL_CAPSULE | Freq: Every day | ORAL | 0 refills | Status: DC

## 2016-10-08 NOTE — Progress Notes (Signed)
BP 126/86 (BP Location: Left Arm, Patient Position: Sitting, Cuff Size: Normal)   Pulse 76   Temp 98 F (36.7 C) (Oral)   Resp 14   Wt 130 lb 9.6 oz (59.2 kg)   SpO2 97%   BMI 23.89 kg/m    Subjective:    Patient ID: Alice Simmons, female    DOB: February 27, 1956, 61 y.o.   MRN: [id number]  HPI: Alice Simmons is a 61 y.o. female  Chief Complaint  Patient presents with  . Referral     Rhemotologist   . URI    A week ago, sore thorat, felt warm, chills and fever also runny nose and body aches. Given OVC meds.  . Oral Swelling    Gums swollen and in pain    Patient is here with her husband HPI Patient would like to see a rheumatologist; they have seen neurologists and they say that it's fibromyalgia; they would to see rheumatologist This has been over a year; every time she goes to a doctor she gets frustrated They went to see a psychiatrist a month ago and they called security on her at the psychiatrist Something has got to be found, she gets frustrated Has seen several neurologist and they suggest rheumatologist Her gums and body hurt; her nerves hurt She feels the air is coming out of her gums; seeing oral surgeon; going to do root canal and special treatment She is asking for something for pain  Sick for one week; mild sore throat; mild chills; body aches; no ear problems; no travel; no weird rash; coughing, tessalon perles really help; allergies are terrible right now  Diagnosis of type 2 diabetes mellitus; last A1c was excellent, 5.5 in April of 2017; query if this was related to previous medicines  Depression screen [facility] 2/9 10/08/2016 06/03/2016 11/12/2015 08/14/2015 07/18/2015  Decreased Interest 1 0 0 0 0  Down, Depressed, Hopeless 1 0 0 1 0  PHQ - 2 Score 2 0 0 1 0  Altered sleeping 1 - - - -  Tired, decreased energy 1 - - - -  Change in appetite 1 - - - -  Feeling bad or failure about yourself  1 - - - -  Trouble concentrating 1 - - - -  Moving slowly or  fidgety/restless 1 - - - -  Suicidal thoughts 0 - - - -  PHQ-9 Score 8 - - - -  Difficult doing work/chores Not difficult at all - - - -  Some encounter information is confidential and restricted. Go to Review Flowsheets activity to see all data.  frustrated with her health conditions; saw psychiatrist and would like to see another  Relevant past medical, surgical, family and social history reviewed Past Medical History:  Diagnosis Date  . Anxiety   . Arthritis 11/15/2012  . Brain tumor ([place])   . Breast pain   . Depression, controlled   . Diabetes mellitus without complication ([place])   . 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 Fibromyalgia   . Hyperlipidemia   . Hypertension, benign   . Insomnia   . Obesity (BMI 30.0-34.9)   . Pain of both breasts    Past Surgical History:  Procedure Laterality Date  . ABDOMINAL HYSTERECTOMY    . ABDOMINAL HYSTERECTOMY  2010  . BILATERAL CARPAL TUNNEL RELEASE    . BREAST BIOPSY Bilateral 1980   EXCISIONAL - NEG  . BREAST BIOPSY Right 1985   CORE W/OUT CLIP - NEG  .  BREAST SURGERY     benign breast cyst  . CARPAL TUNNEL RELEASE    . CHOLECYSTECTOMY  2014  . EYE SURGERY     cataract surger both eyes   Family History  Problem Relation Age of Onset  . Heart disease Mother   . Diabetes Mother   . Hypertension Mother   . Diabetes Father   . Hypertension Father   . Hyperlipidemia Sister   . Hyperlipidemia Brother   . Heart disease Brother   . Hyperlipidemia Brother   . Hyperlipidemia Brother   . Drug abuse Brother   . Depression Brother   . Anxiety disorder Brother   . Hyperlipidemia Brother   . Breast cancer Paternal Aunt   . Neuropathy Neg Hx    Social History  Substance Use Topics  . Smoking status: Current Every Day Smoker    Packs/day: 1.00    Years: 20.00    Types: Cigarettes  . Smokeless tobacco: Never Used  . Alcohol use No     Comment: occasional    Interim medical history since last visit reviewed. Allergies and  medications reviewed  Review of Systems Per HPI unless specifically indicated above     Objective:    BP 126/86 (BP Location: Left Arm, Patient Position: Sitting, Cuff Size: Normal)   Pulse 76   Temp 98 F (36.7 C) (Oral)   Resp 14   Wt 130 lb 9.6 oz (59.2 kg)   SpO2 97%   BMI 23.89 kg/m   Wt Readings from Last 3 Encounters:  10/08/16 130 lb 9.6 oz (59.2 kg)  08/12/16 137 lb 8 oz (62.4 kg)  06/24/16 126 lb (57.2 kg)    Physical Exam  Constitutional: She appears well-developed and well-nourished. No distress.  HENT:  Right Ear: External ear normal. No lacerations. No drainage or swelling. No foreign bodies. No mastoid tenderness. No decreased hearing is noted.  Left Ear: External ear normal. No lacerations. No drainage or swelling. No foreign bodies. No mastoid tenderness. No decreased hearing is noted.  Nose: Nose normal. No rhinorrhea.  Mouth/Throat: Oropharynx is clear and moist. No oral lesions. No dental abscesses. No oropharyngeal exudate.  Cardiovascular: Normal rate and regular rhythm.   Pulmonary/Chest: Effort normal and breath sounds normal. She has no decreased breath sounds. She has no wheezes.  Lymphadenopathy:    She has no cervical adenopathy.       Right: No supraclavicular adenopathy present.       Left: No supraclavicular adenopathy present.  Neurological: She is alert. She displays no tremor.  No tics  Skin: She is not diaphoretic. No pallor.  Psychiatric: Her mood appears not anxious. Her affect is not blunt and not inappropriate. Her speech is not rapid and/or pressured and not tangential. She is not agitated, not aggressive, not hyperactive and not combative. She does not exhibit a depressed mood. She expresses no homicidal and no suicidal ideation.  Animated, talkative but not manic, good eye contact; cooperative; axis II deferred   Diabetic Foot Form - Detailed   Diabetic Foot Exam - detailed Diabetic Foot exam was performed with the following  findings:  Yes 10/08/2016 12:31 PM  Visual Foot Exam completed.:  Yes  Are the toenails ingrown?:  No Normal Range of Motion:  Yes Pulse Foot Exam completed.:  Yes  Right Dorsalis Pedis:  Present Left Dorsalis Pedis:  Present  Sensory Foot Exam Completed.:  Yes Swelling:  No Semmes-Weinstein Monofilament Test R Site 1-Great Toe:  Pos L Site  1-Great Toe:  Pos  R Site 4:  Pos L Site 4:  Pos  R Site 5:  Pos L Site 5:  Pos        Results for orders placed or performed in visit on 10/08/16  POCT HgB A1C  Result Value Ref Range   Hemoglobin A1C 5.4       Assessment & Plan:   Problem List Items Addressed This Visit      Endocrine   Diabetes mellitus type 2, controlled, without complications ([place])    Check A1c      Relevant Orders   POCT HgB A1C (Completed)     Nervous and Auditory   Small fiber neuropathy    Has been seen by several neurologists; husband says that they have told her that her pain is not from her neuropathy and she should see a rheumatologist; continue gabapentin for now; rheum may opt to switch to Lyrica      Relevant Medications   DULoxetine (CYMBALTA) 30 MG capsule     Musculoskeletal and Integument   Diffuse myofascial pain syndrome (Chronic)    Refer to rheum; has already seen neurologist; change paxil to cymbalta, cymbalta 30 mg in AM and paxil 30 mg in PM x 1 week, then cymbalta 60 mg in AM after that; she has an allergy to aspirin and is worried about trying something like meloxicam; I do not plan on treating with narcotics        Other   Fibromyalgia - Primary (Chronic)    Refer to rheumatologist at neurologist's recommendation; may be fibro or something else to rule-out; diffuse, chronic wide-spread pain; will switch from SSRI to SNRI      Relevant Orders   Ambulatory referral to Rheumatology   Bipolar depression ([place])    Refer to another psychiatrist; did not have good experience with most recent; I would also like her to see a  counselor/therapist; switching meds, so close f/u, call if any problems      Relevant Medications   DULoxetine (CYMBALTA) 30 MG capsule   Other Relevant Orders   Ambulatory referral to Psychiatry    Other Visit Diagnoses    Viral upper respiratory tract infection       viral; no indication for antibiotics at this time for diagnosis; tessalon perles PRN       Follow up plan: Return in about 4 weeks (around 11/05/2016) for follow-up.  An after-visit summary was printed and given to the patient at [place].  Please see the patient instructions which may contain other information and recommendations beyond what is mentioned above in the assessment and plan.  Meds ordered this encounter  Medications  . chlorhexidine (PERIDEX) 0.12 % solution    Refill:  0  . SF 1.1 % GEL dental gel    Sig: U D HS AFTER BRUSHING    Refill:  3  . benzonatate (TESSALON PERLES) 100 MG capsule    Sig: Take 1 capsule (100 mg total) by mouth every 8 (eight) hours as needed for cough.    Dispense:  30 capsule    Refill:  0  . DULoxetine (CYMBALTA) 30 MG capsule    Sig: Take 1 capsule (30 mg total) by mouth daily. For 1 week, then two a day; stop paxil    Dispense:  53 capsule    Refill:  0    Orders Placed This Encounter  Procedures  . Ambulatory referral to Rheumatology  . Ambulatory referral to Psychiatry  .  POCT HgB A1C

## 2016-10-08 NOTE — Assessment & Plan Note (Signed)
Refer to another psychiatrist; did not have good experience with most recent; I would also like her to see a counselor/therapist; switching meds, so close f/u, call if any problems

## 2016-10-08 NOTE — Patient Instructions (Addendum)
Use the tessalon perles if needed for cough Change paxil to just once a day for one week, then stop start cymbalta tomorrow, once a day for one week, then two a day We'll have you see the rheumatologist Hydrate well

## 2016-10-08 NOTE — Assessment & Plan Note (Addendum)
Refer to rheum; has already seen neurologist; change paxil to cymbalta, cymbalta 30 mg in AM and paxil 30 mg in PM x 1 week, then cymbalta 60 mg in AM after that; she has an allergy to aspirin and is worried about trying something like meloxicam; I do not plan on treating with narcotics

## 2016-10-08 NOTE — Assessment & Plan Note (Addendum)
Refer to rheumatologist at neurologist's recommendation; may be fibro or something else to rule-out; diffuse, chronic wide-spread pain; will switch from SSRI to SNRI

## 2016-10-08 NOTE — Assessment & Plan Note (Signed)
Has been seen by several neurologists; husband says that they have told her that her pain is not from her neuropathy and she should see a rheumatologist; continue gabapentin for now; rheum may opt to switch to Lyrica

## 2016-10-08 NOTE — Assessment & Plan Note (Signed)
Check A1c. 

## 2016-10-13 ENCOUNTER — Telehealth: Payer: Self-pay | Admitting: Family Medicine

## 2016-10-13 NOTE — Telephone Encounter (Signed)
Alice Simmons from [facility] states that the doctor looked at the referral and declined seeing the patient [phone]

## 2016-10-13 NOTE — Telephone Encounter (Signed)
Referral will be sent to another location

## 2016-10-15 ENCOUNTER — Other Ambulatory Visit: Payer: Self-pay

## 2016-10-15 DIAGNOSIS — M797 Fibromyalgia: Secondary | ICD-10-CM

## 2016-10-19 ENCOUNTER — Ambulatory Visit
Admission: RE | Admit: 2016-10-19 | Discharge: 2016-10-19 | Disposition: A | Discharge status: Home / Self Care | Payer: Medicare Other | Source: Ambulatory Visit | Attending: Family Medicine | Admitting: Family Medicine

## 2016-10-19 ENCOUNTER — Ambulatory Visit: Admission: RE | Admit: 2016-10-19 | Payer: Medicare Other | Source: Ambulatory Visit

## 2016-10-19 ENCOUNTER — Other Ambulatory Visit: Payer: Self-pay | Admitting: Family Medicine

## 2016-10-19 DIAGNOSIS — N6320 Unspecified lump in the left breast, unspecified quadrant: Secondary | ICD-10-CM

## 2016-10-19 DIAGNOSIS — N6321 Unspecified lump in the left breast, upper outer quadrant: Secondary | ICD-10-CM | POA: Diagnosis not present

## 2016-10-19 DIAGNOSIS — R922 Inconclusive mammogram: Secondary | ICD-10-CM | POA: Diagnosis not present

## 2016-10-23 ENCOUNTER — Emergency Department
Admission: EM | Admit: 2016-10-23 | Discharge: 2016-10-23 | Disposition: A | Discharge status: Home / Self Care | Payer: Medicare Other | Attending: Emergency Medicine | Admitting: Emergency Medicine

## 2016-10-23 ENCOUNTER — Encounter: Payer: Self-pay | Admitting: *Deleted

## 2016-10-23 ENCOUNTER — Emergency Department: Payer: Medicare Other

## 2016-10-23 DIAGNOSIS — E119 Type 2 diabetes mellitus without complications: Secondary | ICD-10-CM | POA: Diagnosis not present

## 2016-10-23 DIAGNOSIS — E876 Hypokalemia: Secondary | ICD-10-CM | POA: Diagnosis not present

## 2016-10-23 DIAGNOSIS — I10 Essential (primary) hypertension: Secondary | ICD-10-CM | POA: Insufficient documentation

## 2016-10-23 DIAGNOSIS — R413 Other amnesia: Secondary | ICD-10-CM

## 2016-10-23 DIAGNOSIS — F17210 Nicotine dependence, cigarettes, uncomplicated: Secondary | ICD-10-CM | POA: Insufficient documentation

## 2016-10-23 DIAGNOSIS — R410 Disorientation, unspecified: Secondary | ICD-10-CM | POA: Diagnosis not present

## 2016-10-23 DIAGNOSIS — Z79899 Other long term (current) drug therapy: Secondary | ICD-10-CM | POA: Insufficient documentation

## 2016-10-23 DIAGNOSIS — R51 Headache: Secondary | ICD-10-CM | POA: Diagnosis not present

## 2016-10-23 DIAGNOSIS — R4182 Altered mental status, unspecified: Secondary | ICD-10-CM | POA: Diagnosis present

## 2016-10-23 LAB — URINALYSIS, COMPLETE (UACMP) WITH MICROSCOPIC
BACTERIA UA: NONE SEEN
BILIRUBIN URINE: NEGATIVE
Glucose, UA: NEGATIVE mg/dL
HGB URINE DIPSTICK: NEGATIVE
KETONES UR: 20 mg/dL — AB
Nitrite: NEGATIVE
Protein, ur: NEGATIVE mg/dL
Specific Gravity, Urine: >1.046 — ABNORMAL HIGH (ref 1.005–1.030)
pH: 6 (ref 5.0–8.0)

## 2016-10-23 LAB — COMPREHENSIVE METABOLIC PANEL
ALT: 27 U/L (ref 14–54)
ANION GAP: 11 (ref 5–15)
AST: 42 U/L — ABNORMAL HIGH (ref 15–41)
Albumin: 4.9 g/dL (ref 3.5–5.0)
Alkaline Phosphatase: 81 U/L (ref 38–126)
BUN: 15 mg/dL (ref 6–20)
CHLORIDE: 102 mmol/L (ref 101–111)
CO2: 25 mmol/L (ref 22–32)
Calcium: 9.8 mg/dL (ref 8.9–10.3)
Creatinine, Ser: 1.08 mg/dL — ABNORMAL HIGH (ref 0.44–1.00)
GFR calc Af Amer: >60 mL/min (ref >60)
GFR, EST NON AFRICAN AMERICAN: 55 mL/min — AB (ref >60)
Glucose, Bld: 131 mg/dL — ABNORMAL HIGH (ref 65–99)
POTASSIUM: 2.8 mmol/L — AB (ref 3.5–5.1)
Sodium: 138 mmol/L (ref 135–145)
Total Bilirubin: 0.7 mg/dL (ref 0.3–1.2)
Total Protein: 8.2 g/dL — ABNORMAL HIGH (ref 6.5–8.1)

## 2016-10-23 LAB — GLUCOSE, CAPILLARY: GLUCOSE-CAPILLARY: 131 mg/dL — AB (ref 65–99)

## 2016-10-23 LAB — CBC
HEMATOCRIT: 41.8 % (ref 35.0–47.0)
HEMOGLOBIN: 14.3 g/dL (ref 12.0–16.0)
MCH: 31.3 pg (ref 26.0–34.0)
MCHC: 34.2 g/dL (ref 32.0–36.0)
MCV: 91.4 fL (ref 80.0–100.0)
Platelets: 437 10*3/uL (ref 150–440)
RBC: 4.57 MIL/uL (ref 3.80–5.20)
RDW: 13.1 % (ref 11.5–14.5)
WBC: 7.2 10*3/uL (ref 3.6–11.0)

## 2016-10-23 LAB — TSH: TSH: 3.75 u[IU]/mL (ref 0.350–4.500)

## 2016-10-23 MED ORDER — POTASSIUM CHLORIDE CRYS ER 20 MEQ PO TBCR
40.0000 meq | EXTENDED_RELEASE_TABLET | Freq: Once | ORAL | Status: AC
  Administered 2016-10-23: 40 meq via ORAL
  Filled 2016-10-23: qty 2

## 2016-10-23 MED ORDER — POTASSIUM CHLORIDE ER 10 MEQ PO TBCR: 10.0000 meq | EXTENDED_RELEASE_TABLET | Freq: Every day | ORAL | 0 refills | Status: DC

## 2016-10-23 MED ORDER — POTASSIUM CHLORIDE CRYS ER 20 MEQ PO TBCR: 20.0000 meq | EXTENDED_RELEASE_TABLET | Freq: Once | ORAL | Status: DC

## 2016-10-23 MED ORDER — IOPAMIDOL (ISOVUE-300) INJECTION 61%
75.0000 mL | Freq: Once | INTRAVENOUS | Status: AC | PRN
  Administered 2016-10-23: 75 mL via INTRAVENOUS

## 2016-10-23 NOTE — ED Notes (Signed)
Lunch was given patient

## 2016-10-23 NOTE — ED Notes (Signed)
Pt dressed in hospital gown. Underwear remain on. Pt remains as medical patient at this time.

## 2016-10-23 NOTE — Consult Note (Signed)
[facility] Consult   Reason for Consult:  Consult for 61 year old woman came to the emergency room complaining of memory loss Referring Physician:  Quale Patient Identification: Keiasia Christianson MRN:  [id number] Principal Diagnosis: Memory loss Diagnosis:   Patient Active Problem List   Diagnosis Date Noted  . Memory loss [R41.3] 10/23/2016  . Unrefreshed by sleep [G47.8] 08/13/2016  . Encounter for medication monitoring [Z51.81] 06/24/2016  . Substance abuse [F19.10] 06/24/2016  . Syncope [R55] 06/24/2016  . Bradycardia [R00.1] 06/03/2016  . Marijuana abuse [F12.10] 02/15/2016  . Severe episode of recurrent major depressive disorder, without psychotic features ([place]) [F33.2] 01/23/2016  . Generalized anxiety disorder [F41.1] 01/23/2016  . Panic disorder [F41.0] 01/23/2016  . Pain [R52] 01/01/2016  . Fasciculations [R25.3] 11/21/2015  . Toxic effect of mercury [T56.1X1A] 10/15/2015  . Small fiber neuropathy [G62.9] 10/10/2015  . Fatigue [R53.83] 10/10/2015  . Abnormal weight loss [R63.4] 10/10/2015  . Encounter for therapeutic drug level monitoring [Z51.81] 10/10/2015  . Lymphocytosis [D72.820] 10/10/2015  . Elevated liver enzymes [R74.8] 10/10/2015  . H/O cosmetic surgery [Z98.890] 08/14/2015  . Chronic lower extremity pain (Right) [T61.443, G89.29] 07/18/2015  . Hypertension goal BP (blood pressure) < 140/90 [I10] 06/05/2015  . Bilateral tinnitus [H93.13] 06/05/2015  . Left breast mass [N63.20] 06/05/2015  . Neurogenic pain [M79.2] 05/07/2015  . Musculoskeletal pain [M79.1] 05/07/2015  . Diffuse myofascial pain syndrome [M79.1] 05/07/2015  . Allodynia [R20.8] 05/07/2015  . 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 Marijuana abuse, continuous [F12.10] 04/25/2015  . Fibromyalgia [M79.7] 04/20/2015  . Generalized Paresthesias [R20.2] 04/20/2015  . Paresthesias with subjective weakness [R20.2, R53.1] 04/20/2015  . Chronic pain [G89.29]  04/20/2015  . Sensitivity to sunlight [Z91.09] 04/20/2015  . Continuous illicit drug use [X54.00] 03/21/2015  . Chronic low back pain (Location of Primary Source of Pain) (Bilateral) (R>L) [M54.5, G89.29] 03/21/2015  . Chronic pain syndrome [G89.4] 03/21/2015  . Lumbar facet syndrome (Bilateral) (R>L) [M46.96] 03/21/2015  . Lumbar radicular pain [M54.16] 03/21/2015  . Lumbosacral radiculopathy [M54.17] 03/21/2015  . Lumbar spondylosis [M47.26] 03/21/2015  . Clinical depression [F32.9] 11/28/2014  . Diabetes mellitus type 2, controlled, without complications ([place]) [Q67.6] 11/28/2014  . Mass of left breast on mammogram [N63.20] 11/28/2014  . Back pain, thoracic [M54.6] 11/28/2014  . Hyperlipidemia [E78.5] 11/28/2014  . Vitamin D deficiency [E55.9] 11/28/2014  . Primary meningioma of optic nerve sheath ([place]) [D32.0] 11/28/2014  . Bipolar depression ([place]) [F31.30] 12/29/2013  . Self-inflicted injury [P95.09] 12/29/2013  . Migraine headache [G43.909] 08/17/2013  . Chronic insomnia [F51.04] 08/17/2013  . Tobacco abuse [Z72.0] 05/01/2013  . Apnea, sleep [G47.30] 03/02/2012  . Anxiety [F41.9] 03/04/2011    Total Time spent with patient: 1 hour  Subjective:   Alice Simmons is a 61 y.o. female patient admitted with "I can't remember anything".  HPI:  Patient interviewed with the assistance of a hospital provided Spanish language interpreter. Chart reviewed. Case discussed with emergency room physician. This 62 year old woman came to the emergency room apparently with a chief complaint of memory impairment. On interview today the patient tells me that she can't remember anything. Her examples are that she couldn't find where she has left the pesticide powder in her house when she went looking for it and that she frequently loses things. Patient denied to me having any depression. Denied any significant problems with sleep. Totally denied auditory or visual hallucinations. Denied any  suicidal thought or morbidity. Denied any homicidal ideation. Parent with the presentation she  made to means different than what the emergency room doctor saw earlier. Earlier when he interviewed her reportedly she couldn't remember anything and answered any of the questions he asked her. By contrast the patient could repeat 3 words for me and then remembered all 3 of them in 3 minutes. She was able to tell me the names of all of her family members, her address, the name of her doctor and multiple other details of her day-to-day life. Patient was a little bit tangential and required redirection. A little odd and labile in her affect. She admits that she uses marijuana daily. Denies that she's using any other drugs and denies alcohol abuse. Are to tell whether she is taking any other medicines her history was a little inconsistent on that point but it sounds like she saying she is not taking any other prescription medicine.  Social history: Patient is originally from [place]. She is married. She and her husband live together. Husband works. She has adult children and a full family in the area. Patient's English skills are actually better than one would think at first.  Medical history: She says she is been diagnosed with fibromyalgia. Looks like she's had a long history of complaints of multiple neurologic symptoms none of which have necessarily been clearly tied to a particular illness. He were mentioned in the past but she didn't mention them today and it doesn't sound like that's an ongoing concern.  Substance abuse history: Admits to regular marijuana use. Denies any other drug abuse  Past Psychiatric History: Patient saw Dr. Gretel Acre at our office a couple months ago. Dr. Gretel Acre at that time was suggesting inpatient hospitalization I think mainly because the patient was so confusing to diagnose. Patient was not however thought to meet commitment criteria and she refused the admission and left. She saw  some psychiatric providers in [place] in the past. She reports that there was one episode about 10 years ago that she cut herself but denies any other past suicidal behavior. Chart reports that the husband said she used to take Xanax but is not taking it now.  Risk to Self: Is patient at risk for suicide?: No Risk to Others:   Prior Inpatient Therapy:   Prior Outpatient Therapy:    Past Medical History:  Past Medical History:  Diagnosis Date  . Anxiety   . Arthritis 11/15/2012  . Brain tumor ([place])   . Breast pain   . Depression, controlled   . Diabetes mellitus without complication ([place])   . 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 Fibromyalgia   . Hyperlipidemia   . Hypertension, benign   . Insomnia   . Obesity (BMI 30.0-34.9)   . Pain of both breasts     Past Surgical History:  Procedure Laterality Date  . ABDOMINAL HYSTERECTOMY    . ABDOMINAL HYSTERECTOMY  2010  . BILATERAL CARPAL TUNNEL RELEASE    . BREAST BIOPSY Bilateral 1980   EXCISIONAL - NEG  . BREAST BIOPSY Right 1985   CORE W/OUT CLIP - NEG  . BREAST SURGERY     benign breast cyst  . CARPAL TUNNEL RELEASE    . CHOLECYSTECTOMY  2014  . EYE SURGERY     cataract surger both eyes   Family History:  Family History  Problem Relation Age of Onset  . Heart disease Mother   . Diabetes Mother   . Hypertension Mother   . Diabetes Father   . Hypertension Father   . Hyperlipidemia Sister   .  Hyperlipidemia Brother   . Heart disease Brother   . Hyperlipidemia Brother   . Hyperlipidemia Brother   . Drug abuse Brother   . Depression Brother   . Anxiety disorder Brother   . Hyperlipidemia Brother   . Breast cancer Paternal Aunt   . Neuropathy Neg Hx    Family Psychiatric  History: Denies any Social History:  History  Alcohol Use No    Comment: occasional     History  Drug Use  . Types: Marijuana    Comment: smokes MJ 2 times per day    Social History   Social History  . Marital status: Married    Spouse  name: Hessie Diener  . Number of children: 2  . Years of education: 12   Occupational History  . Unemployed    Social History Main Topics  . Smoking status: Current Every Day Smoker    Packs/day: 1.00    Years: 20.00    Types: Cigarettes  . Smokeless tobacco: Never Used  . Alcohol use No     Comment: occasional  . Drug use: Yes    Types: Marijuana     Comment: smokes MJ 2 times per day  . Sexual activity: Not Currently   Other Topics Concern  . None   Social History Narrative   Lives alone   Caffeine use: Drinks coffee (3 cups per day)   ** Merged History Encounter **       Additional Social History:    Allergies:   Allergies  Allergen Reactions  . Aspirin Swelling  . Penicillins Swelling    Labs:  Results for orders placed or performed during the hospital encounter of 10/23/16 (from the past 48 hour(s))  Comprehensive metabolic panel     Status: Abnormal   Collection Time: 10/23/16  8:16 AM  Result Value Ref Range   Sodium 138 135 - 145 mmol/L   Potassium 2.8 (L) 3.5 - 5.1 mmol/L   Chloride 102 101 - 111 mmol/L   CO2 25 22 - 32 mmol/L   Glucose, Bld 131 (H) 65 - 99 mg/dL   BUN 15 6 - 20 mg/dL   Creatinine, Ser 1.08 (H) 0.44 - 1.00 mg/dL   Calcium 9.8 8.9 - 10.3 mg/dL   Total Protein 8.2 (H) 6.5 - 8.1 g/dL   Albumin 4.9 3.5 - 5.0 g/dL   AST 42 (H) 15 - 41 U/L   ALT 27 14 - 54 U/L   Alkaline Phosphatase 81 38 - 126 U/L   Total Bilirubin 0.7 0.3 - 1.2 mg/dL   GFR calc non Af Amer 55 (L) >60 mL/min   GFR calc Af Amer >60 >60 mL/min    Comment: (NOTE) The eGFR has been calculated using the CKD EPI equation. This calculation has not been validated in all clinical situations. eGFR's persistently <60 mL/min signify possible Chronic Kidney Disease.    Anion gap 11 5 - 15  CBC     Status: None   Collection Time: 10/23/16  8:16 AM  Result Value Ref Range   WBC 7.2 3.6 - 11.0 K/uL   RBC 4.57 3.80 - 5.20 MIL/uL   Hemoglobin 14.3 12.0 - 16.0 g/dL   HCT  41.8 35.0 - 47.0 %   MCV 91.4 80.0 - 100.0 fL   MCH 31.3 26.0 - 34.0 pg   MCHC 34.2 32.0 - 36.0 g/dL   RDW 13.1 11.5 - 14.5 %   Platelets 437 150 - 440 K/uL  TSH  Status: None   Collection Time: 10/23/16  8:16 AM  Result Value Ref Range   TSH 3.750 0.350 - 4.500 uIU/mL    Comment: Performed by a 3rd Generation assay with a functional sensitivity of <=0.01 uIU/mL.  Glucose, capillary     Status: Abnormal   Collection Time: 10/23/16  8:23 AM  Result Value Ref Range   Glucose-Capillary 131 (H) 65 - 99 mg/dL  Urinalysis, Complete w Microscopic     Status: Abnormal   Collection Time: 10/23/16  2:13 PM  Result Value Ref Range   Color, Urine YELLOW (A) YELLOW   APPearance HAZY (A) CLEAR   Specific Gravity, Urine >1.046 (H) 1.005 - 1.030   pH 6.0 5.0 - 8.0   Glucose, UA NEGATIVE NEGATIVE mg/dL   Hgb urine dipstick NEGATIVE NEGATIVE   Bilirubin Urine NEGATIVE NEGATIVE   Ketones, ur 20 (A) NEGATIVE mg/dL   Protein, ur NEGATIVE NEGATIVE mg/dL   Nitrite NEGATIVE NEGATIVE   Leukocytes, UA TRACE (A) NEGATIVE   RBC / HPF 0-5 0 - 5 RBC/hpf   WBC, UA 0-5 0 - 5 WBC/hpf   Bacteria, UA NONE SEEN NONE SEEN   Squamous Epithelial / LPF 6-30 (A) NONE SEEN   Mucous PRESENT     Current Facility-Administered Medications  Medication Dose Route Frequency Provider Last Rate Last Dose  . [START ON 10/24/2016] potassium chloride SA (K-DUR,KLOR-CON) CR tablet 20 mEq  20 mEq Oral Once Delman Kitten, MD       Current Outpatient Prescriptions  Medication Sig Dispense Refill  . amLODipine (NORVASC) 10 MG tablet TAKE 1 TABLET(10 MG) BY MOUTH DAILY 90 tablet 2  . atorvastatin (LIPITOR) 20 MG tablet TAKE 1 TABLET(20 MG) BY MOUTH AT BEDTIME 90 tablet 0  . benzonatate (TESSALON PERLES) 100 MG capsule Take 1 capsule (100 mg total) by mouth every 8 (eight) hours as needed for cough. 30 capsule 0  . chlorhexidine (PERIDEX) 0.12 % solution   0  . DULoxetine (CYMBALTA) 30 MG capsule Take 1 capsule (30 mg total) by  mouth daily. For 1 week, then two a day; stop paxil 53 capsule 0  . gabapentin (NEURONTIN) 600 MG tablet TAKE 1 TABLET BY MOUTH THREE TIMES DAILY 90 tablet 3  . losartan (COZAAR) 50 MG tablet TAKE 1 TABLET BY MOUTH DAILY 30 tablet 5  . potassium chloride (K-DUR) 10 MEQ tablet Take 1 tablet (10 mEq total) by mouth daily. 5 tablet 0  . SF 1.1 % GEL dental gel U D HS AFTER BRUSHING  3  . topiramate (TOPAMAX) 100 MG tablet TAKE 2 TABLETS(200 MG) BY MOUTH AT BEDTIME 180 tablet 1    Musculoskeletal: Strength & Muscle Tone: within normal limits Gait & Station: normal Patient leans: N/A  Psychiatric Specialty Exam: Physical Exam  Nursing note and vitals reviewed. Constitutional: She appears well-developed and well-nourished.  HENT:  Head: Normocephalic and atraumatic.  Eyes: Conjunctivae are normal. Pupils are equal, round, and reactive to light.  Neck: Normal range of motion.  Cardiovascular: Regular rhythm and normal heart sounds.   Respiratory: Effort normal. No respiratory distress.  GI: Soft.  Musculoskeletal: Normal range of motion.  Neurological: She is alert.  Skin: Skin is warm and dry.  Psychiatric: Her affect is labile. Her speech is tangential. Thought content is not paranoid. Cognition and memory are normal. She expresses impulsivity. She expresses no homicidal and no suicidal ideation. She is inattentive.    Review of Systems  Constitutional: Negative.   HENT: Negative.  Eyes: Negative.   Respiratory: Negative.   Cardiovascular: Negative.   Gastrointestinal: Negative.   Musculoskeletal: Positive for myalgias.  Skin: Negative.   Neurological: Negative.   Psychiatric/Behavioral: Positive for memory loss and substance abuse. Negative for depression, hallucinations and suicidal ideas. The patient is nervous/anxious. The patient does not have insomnia.     Blood pressure (!) 166/86, pulse 96, temperature 98.7 F (37.1 C), temperature source Oral, resp. rate 18, height 5'  2" (1.575 m), weight 59 kg (130 lb), SpO2 97 %.Body mass index is 23.78 kg/m.  General Appearance: Fairly Groomed  Eye Contact:  Good  Speech:  Pressured  Volume:  Increased  Mood:  Euthymic  Affect:  Labile  Thought Process:  Disorganized  Orientation:  Full (Time, Place, and Person)  Thought Content:  Tangential  Suicidal Thoughts:  No  Homicidal Thoughts:  No  Memory:  Immediate;   Good Recent;   Good Remote;   Fair  Judgement:  Fair  Insight:  Shallow  Psychomotor Activity:  Normal  Concentration:  Concentration: Fair  Recall:  [employer] of Knowledge:  Fair  Language:  Fair  Akathisia:  No  Handed:  Right  AIMS (if indicated):     Assets:  Communication Skills Desire for Improvement Housing Physical Health Resilience Social Support  ADL's:  Intact  Cognition:  Impaired,  Mild  Sleep:        Treatment Plan Summary: Daily contact with patient to assess and evaluate symptoms and progress in treatment, Medication management and Plan Confusing presentation. Earlier on she apparently was showing actual problems with her memory but to my interview she essentially is normal. Couldn't really isolate any specific definable cognitive deficit although she continues to complain of memory problems. She is a little peculiar and labile in her affect and it's not impossible that she could have a bipolar type illness however she doesn't appear to be psychotic and there is no evidence of her being dangerous. Patient does not meet commitment criteria and is not likely to require inpatient hospital treatment and in any case is asking to be released. Patient will be referred to consider going to her primary care doctor to continue working this up and see if he can convince her to see a psychiatrist or mental health provider. I strongly advised her to discontinue marijuana use as that can lead to short-term memory impairment. Case reviewed with the ER physician. Discontinue IVC.  Disposition:  Patient does not meet criteria for psychiatric inpatient admission. Supportive therapy provided about ongoing stressors.  Alethia Berthold, MD 10/23/2016 2:44 PM

## 2016-10-23 NOTE — ED Provider Notes (Signed)
[facility] Emergency Department Provider Note   ____________________________________________   First MD Initiated Contact with Patient 10/23/16 [phone]     (approximate)  I have reviewed the triage vital signs and the nursing notes.  Spanish interpreter utilize  HISTORY  Chief Complaint Altered Mental Status  EM caveat: Confusion  HPI Alice Simmons is a 61 y.o. female who via Spanish interpreter reports that she is here because of confusion. She is having trouble remembering things. She actually cannot tell me the day, month and year or where she is presently. She reports that she puts cockroach spray some [place] and can't remember. She tells me her husband brought her here because she is been very confused and not acting right.  She denies wanting to harm herself or anyone else. Conversation is quite tangential, sometimes infrequently going back to the Bible in reviewing scripture, and talking to me more about cockroaches in her home, then talking about her husband, and she seems somewhat confused.  She also reports that for a long time she's been explained to occasional headaches, she is not having any pain now that she's noticed over the last year that her left eye vision is blurry. She tells me she is been told that there is a tumor that needs evaluation, but she seen doctors for that and they decided not treated.  She also talks about electrical feelings or hearing a "shocks," somewhat hard to discern exactly what she is referring to as she can't describe it.  Past Medical History:  Diagnosis Date  . Anxiety   . Arthritis 11/15/2012  . Brain tumor ([place])   . Breast pain   . Depression, controlled   . Diabetes mellitus without complication ([place])   . 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 Fibromyalgia   . Hyperlipidemia   . Hypertension, benign   . Insomnia   . Obesity (BMI 30.0-34.9)   . Pain of both breasts     Patient Active Problem List     Diagnosis Date Noted  . Memory loss 10/23/2016  . Unrefreshed by sleep 08/13/2016  . Encounter for medication monitoring 06/24/2016  . Substance abuse 06/24/2016  . Syncope 06/24/2016  . Bradycardia 06/03/2016  . Marijuana abuse 02/15/2016  . Severe episode of recurrent major depressive disorder, without psychotic features ([place]) 01/23/2016  . Generalized anxiety disorder 01/23/2016  . Panic disorder 01/23/2016  . Pain 01/01/2016  . Fasciculations 11/21/2015  . Toxic effect of mercury 10/15/2015  . Small fiber neuropathy 10/10/2015  . Fatigue 10/10/2015  . Abnormal weight loss 10/10/2015  . Encounter for therapeutic drug level monitoring 10/10/2015  . Lymphocytosis 10/10/2015  . Elevated liver enzymes 10/10/2015  . H/O cosmetic surgery 08/14/2015  . Chronic lower extremity pain (Right) 07/18/2015  . Hypertension goal BP (blood pressure) < 140/90 06/05/2015  . Bilateral tinnitus 06/05/2015  . Left breast mass 06/05/2015  . Neurogenic pain 05/07/2015  . Musculoskeletal pain 05/07/2015  . Diffuse myofascial pain syndrome 05/07/2015  . Allodynia 05/07/2015  . 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 Marijuana abuse, continuous 04/25/2015  . Fibromyalgia 04/20/2015  . Generalized Paresthesias 04/20/2015  . Paresthesias with subjective weakness 04/20/2015  . Chronic pain 04/20/2015  . Sensitivity to sunlight 04/20/2015  . Continuous illicit drug use 94/17/4081  . Chronic low back pain (Location of Primary Source of Pain) (Bilateral) (R>L) 03/21/2015  . Chronic pain syndrome 03/21/2015  . Lumbar facet syndrome (Bilateral) (R>L) 03/21/2015  . Lumbar radicular pain 03/21/2015  .  Lumbosacral radiculopathy 03/21/2015  . Lumbar spondylosis 03/21/2015  . Clinical depression 11/28/2014  . Diabetes mellitus type 2, controlled, without complications ([place]) 09/32/3557  . Mass of left breast on mammogram 11/28/2014  . Back pain, thoracic 11/28/2014  . Hyperlipidemia 11/28/2014  .  Vitamin D deficiency 11/28/2014  . 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 Bipolar depression ([place]) 12/29/2013  . Self-inflicted injury 32/20/2542  . Migraine headache 08/17/2013  . Chronic insomnia 08/17/2013  . Tobacco abuse 05/01/2013  . Apnea, sleep 03/02/2012  . Anxiety 03/04/2011    Past Surgical History:  Procedure Laterality Date  . ABDOMINAL HYSTERECTOMY    . ABDOMINAL HYSTERECTOMY  2010  . BILATERAL CARPAL TUNNEL RELEASE    . BREAST BIOPSY Bilateral 1980   EXCISIONAL - NEG  . BREAST BIOPSY Right 1985   CORE W/OUT CLIP - NEG  . BREAST SURGERY     benign breast cyst  . CARPAL TUNNEL RELEASE    . CHOLECYSTECTOMY  2014  . EYE SURGERY     cataract surger both eyes    Prior to Admission medications   Medication Sig Start Date End Date Taking? Authorizing Provider  amLODipine (NORVASC) 10 MG tablet TAKE 1 TABLET(10 MG) BY MOUTH DAILY 09/06/16   Lada, Satira Anis, MD  atorvastatin (LIPITOR) 20 MG tablet TAKE 1 TABLET(20 MG) BY MOUTH AT BEDTIME 09/27/16   Lada, Satira Anis, MD  benzonatate (TESSALON PERLES) 100 MG capsule Take 1 capsule (100 mg total) by mouth every 8 (eight) hours as needed for cough. 10/08/16   Arnetha Courser, MD  chlorhexidine (PERIDEX) 0.12 % solution  09/07/16   [provider]  DULoxetine (CYMBALTA) 30 MG capsule Take 1 capsule (30 mg total) by mouth daily. For 1 week, then two a day; stop paxil 10/08/16   Lada, Satira Anis, MD  gabapentin (NEURONTIN) 600 MG tablet TAKE 1 TABLET BY MOUTH THREE TIMES DAILY 07/16/16   Lada, Satira Anis, MD  losartan (COZAAR) 50 MG tablet TAKE 1 TABLET BY MOUTH DAILY 07/01/16   Lada, Satira Anis, MD  potassium chloride (K-DUR) 10 MEQ tablet Take 1 tablet (10 mEq total) by mouth daily. 10/23/16   Delman Kitten, MD  SF 1.1 % GEL dental gel U D HS AFTER BRUSHING 08/02/16   [provider]  topiramate (TOPAMAX) 100 MG tablet TAKE 2 TABLETS(200 MG) BY MOUTH AT BEDTIME 09/15/16   Lada, Satira Anis, MD     Allergies Aspirin and Penicillins  Family History  Problem Relation Age of Onset  . Heart disease Mother   . Diabetes Mother   . Hypertension Mother   . Diabetes Father   . Hypertension Father   . Hyperlipidemia Sister   . Hyperlipidemia Brother   . Heart disease Brother   . Hyperlipidemia Brother   . Hyperlipidemia Brother   . Drug abuse Brother   . Depression Brother   . Anxiety disorder Brother   . Hyperlipidemia Brother   . Breast cancer Paternal Aunt   . Neuropathy Neg Hx     Social History Social History  Substance Use Topics  . Smoking status: Current Every Day Smoker    Packs/day: 1.00    Years: 20.00    Types: Cigarettes  . Smokeless tobacco: Never Used  . Alcohol use No     Comment: occasional    Review of Systems Constitutional: No fever/chills Eyes: No visual changes such her left eye has been fuzzy for over a year. ENT: No sore  throat. Cardiovascular: Denies chest pain. Respiratory: Denies shortness of breath. Gastrointestinal: No abdominal pain.  No nausea, no vomiting.   Genitourinary: Negative for dysuria. Musculoskeletal: Negative for back pain. Skin: Negative for rash. Neurological: Negative for headaches though at times she experiences "shocks", with no focal weakness or numbness.  10-point ROS otherwise negative.  ____________________________________________   PHYSICAL EXAM:  VITAL SIGNS: ED Triage Vitals  Enc Vitals Group     BP 10/23/16 0813 (!) 168/92     Pulse Rate 10/23/16 0813 (!) 113     Resp 10/23/16 0813 16     Temp 10/23/16 0813 98.7 F (37.1 C)     Temp Source 10/23/16 0813 Oral     SpO2 10/23/16 0813 100 %     Weight 10/23/16 0814 130 lb (59 kg)     Height 10/23/16 0814 5\' 2"  (1.575 m)     Head Circumference --      Peak Flow --      Pain Score 10/23/16 0813 5     Pain Loc --      Pain Edu? --      Excl. in [place]? --     Constitutional: Alert and orientedOnly to self. Slightly anxious, reading the Bible  intently. Eyes: Conjunctivae are normal. PERRL. EOMI. Head: Atraumatic. Nose: No congestion/rhinnorhea. Mouth/Throat: Mucous membranes are moist.  Oropharynx non-erythematous. Neck: No stridor.   Cardiovascular: Normal rate, regular rhythm. Grossly normal heart sounds.  Good peripheral circulation. Respiratory: Normal respiratory effort.  No retractions. Lungs CTAB. Gastrointestinal: Soft and nontender. No distention.  Musculoskeletal: No lower extremity tenderness nor edema.  No joint effusions. Neurologic:  Normal speech and language. No gross focal neurologic deficits are appreciated. No gait instability. Skin:  Skin is warm, dry and intact. No rash noted. Psychiatric: Mood and affect are elevated, almost euphoric. Speech and behavior are tangential and seems somewhat hyperreligious frequently talking to me then pointing and speaking of thing she is reading in the Bible, but having difficulty concentrating on our conversation because she continuously wants to reference back to the Bible.  ____________________________________________   LABS (all labs ordered are listed, but only abnormal results are displayed)  Labs Reviewed  GLUCOSE, CAPILLARY - Abnormal; Notable for the following:       Result Value   Glucose-Capillary 131 (*)    All other components within normal limits  COMPREHENSIVE METABOLIC PANEL - Abnormal; Notable for the following:    Potassium 2.8 (*)    Glucose, Bld 131 (*)    Creatinine, Ser 1.08 (*)    Total Protein 8.2 (*)    AST 42 (*)    GFR calc non Af Amer 55 (*)    All other components within normal limits  URINALYSIS, COMPLETE (UACMP) WITH MICROSCOPIC - Abnormal; Notable for the following:    Color, Urine YELLOW (*)    APPearance HAZY (*)    Specific Gravity, Urine >1.046 (*)    Ketones, ur 20 (*)    Leukocytes, UA TRACE (*)    Squamous Epithelial / LPF 6-30 (*)    All other components within normal limits  URINE CULTURE  CBC  TSH  RPR    ____________________________________________  EKG   ____________________________________________  RADIOLOGY  Ct Head W Or Wo Contrast  Result Date: 10/23/2016 CLINICAL DATA:  Increasing headache EXAM: CT HEAD WITHOUT AND WITH CONTRAST TECHNIQUE: Contiguous axial images were obtained from the base of the skull through the vertex without and with intravenous contrast CONTRAST:  105mL ISOVUE-300  IOPAMIDOL (ISOVUE-300) INJECTION 61% COMPARISON:  MRI head 09/07/2014 FINDINGS: Brain: No evidence of acute infarction, hemorrhage, hydrocephalus, extra-axial collection or mass lesion/mass effect. Normal enhancement postcontrast administration. Vascular: Negative for hyperdense vessel. Normal arterial and venous enhancement. Skull: Negative Sinuses/Orbits: Negative Other: None IMPRESSION: Negative CT head with contrast. Electronically Signed   By: Franchot Gallo M.D.   On: 10/23/2016 10:02    ____________________________________________   PROCEDURES  Procedure(s) performed: None  Procedures  Critical Care performed: No  ____________________________________________   INITIAL IMPRESSION / ASSESSMENT AND PLAN / ED COURSE  Pertinent labs & imaging results that were available during my care of the patient were reviewed by me and considered in my medical decision making (see chart for details).  Patient with somewhat bizarre tangential behavior. History of bipolar noted in her chart, and I called and spoke with her personal physician Dr. Jacinto Reap who tells me that back in March the patient was to be hospitalized psychiatry with Dr. for heme, but the patient either eloped ran out from the visit. She's also had to call the police to the primary clinic before due to bizarre behavior and aggressiveness. Dr. Jacinto Reap reports that this behavior from the patient is something gets been chronic, but she also feels the patient is in need of acute psychiatric care. Based on the patient's inability to tell me where  she is, her disorientation, hyper elevated mood and somewhat manic type behaviors I have placed her under involuntary commitment for her own safety though she denies suicidal ideation. I am concerned the patient is unstable psychiatrically. Her medical screening labs are fairly unremarkable except for slightly low potassium and her CT with contrast does not demonstrate any obvious large mass the reports from [facility] suggests she may have a meningioma in the optic region, but this is likely a stable ongoing issue  ----------------------------------------- 11:04 AM on 10/23/2016 -----------------------------------------  Patient resting. Compliant. Continue to read Bible.    ----------------------------------------- 2:10 PM on 10/23/2016 -----------------------------------------  Patient seen and released from involuntary commitment by Dr. Weber Cooks of psychiatry. Interestingly, patient is now fully oriented, knows her address, date time place and location. She now appears to have capacity, and I would agree that she is not hostile and she denies wine a harm herself or anyone else. She is fully oriented. Does not appear to meet criteria for inpatient commitment, and she appears to have capacity now as well. Somewhat odd presentation. Discussed with the patient that she definitely needs to follow-up with Dr. Sanda Klein, she is agreeable to this now also recommended that she follow up again with [facility] at radiation oncology and she is also agreeable to this.  UA slightly contaminated, no bacteria. Send for culture.  ----------------------------------------- 2:45 PM on 10/23/2016 -----------------------------------------  Discharge via [place] interpreter. She is awake alert and fully oriented no distress or complaints at this time. Husband taking her home.    ____________________________________________   FINAL CLINICAL IMPRESSION(S) / ED DIAGNOSES  Final diagnoses:  Confusion    Hypokalemia      NEW MEDICATIONS STARTED DURING THIS VISIT:  New Prescriptions   POTASSIUM CHLORIDE (K-DUR) 10 MEQ TABLET    Take 1 tablet (10 mEq total) by mouth daily.     Note:  This document was prepared using Dragon voice recognition software and may include unintentional dictation errors.     Delman Kitten, MD 10/23/16 1501

## 2016-10-23 NOTE — ED Notes (Signed)
Spoke with husband who states he will be here shortly to pick patient up.

## 2016-10-23 NOTE — Discharge Instructions (Signed)
You have been seen in the Emergency Department (ED) today for a psychiatric complaint.  You have been evaluated by psychiatry and we believe you are safe to be discharged from the hospital.  Follow-up with Dr. Sanda Klein this coming week and also with Dr. Gretel Acre as soon as possible.  Please return to the ED immediately if you have ANY thoughts of hurting yourself or anyone else, so that we may help you.  Please avoid alcohol and drug use.  Follow up with your doctor and/or therapist as soon as possible regarding today's ED visit.   Please follow up any other recommendations and clinic appointments provided by the psychiatry team that saw you in the Emergency Department.

## 2016-10-23 NOTE — ED Notes (Signed)
Patient transported to [facility] with RN, officer, interpretor, CT tech

## 2016-10-23 NOTE — ED Notes (Signed)
Pt found wondering behind physcian/nursing station. Redirected back to room.

## 2016-10-23 NOTE — ED Notes (Signed)
Ex husband Alice Simmons [phone]

## 2016-10-23 NOTE — ED Notes (Signed)
Waiting on psych consult. Pt remains medical per dr Jacqualine Code also until further lab tests result. NAD. Pt remains cooperative currently.

## 2016-10-23 NOTE — ED Triage Notes (Addendum)
Per intepeter pt states she has been very forgetful, cant find anything recently, states yesterday she went to the park with her daughter and got lost, states this AM she was looking for poison to kill cockroaches that her grandson brought to her house and then forgot what she was doing, denies SI or HI, denies hallucinations, also states right leg pain with hx of fibromyalgia, awake and alert, per her ex husband pt has been paranoid about people sending her messages on facebook and youtube, pt tearful

## 2016-10-23 NOTE — ED Notes (Addendum)
Pt brought to ED by ex husband (she lives with him) but would not let RN talk to him or let him in the room.  When patient asked why she came to the hospital she states " the electricity is bad, I can't take it anymore".  Unable to determine what patient is talking about. She has told RN more than once that she reads the bible and makes notes.  Pt is disoriented.  Talks about how when mother died her brother did not let her see her and he did not take good care of her.  Pt appears kept. When RN asked pt for urine specimen to make sure there is no infection pt refusing stating "I am clean, I do not have infection".  Very difficult to assess pt and her status. Denies drug or alcohol use.  Denies HI/SI/hallucinations

## 2016-10-23 NOTE — ED Notes (Signed)
Patient in room resting.

## 2016-10-24 LAB — URINE CULTURE: Special Requests: NORMAL

## 2016-10-24 LAB — RPR: RPR: NONREACTIVE

## 2016-10-26 ENCOUNTER — Ambulatory Visit (INDEPENDENT_AMBULATORY_CARE_PROVIDER_SITE_OTHER): Payer: Medicare Other | Admitting: Family Medicine

## 2016-10-26 ENCOUNTER — Other Ambulatory Visit: Payer: Self-pay | Admitting: Family Medicine

## 2016-10-26 ENCOUNTER — Ambulatory Visit
Admission: RE | Admit: 2016-10-26 | Discharge: 2016-10-26 | Disposition: A | Discharge status: Home / Self Care | Payer: Medicare Other | Source: Ambulatory Visit | Attending: Family Medicine | Admitting: Family Medicine

## 2016-10-26 ENCOUNTER — Telehealth: Payer: Self-pay

## 2016-10-26 ENCOUNTER — Other Ambulatory Visit
Admission: RE | Admit: 2016-10-26 | Discharge: 2016-10-26 | Disposition: A | Discharge status: Home / Self Care | Payer: Medicare Other | Source: Ambulatory Visit | Attending: Family Medicine | Admitting: Family Medicine

## 2016-10-26 ENCOUNTER — Encounter: Payer: Self-pay | Admitting: Family Medicine

## 2016-10-26 VITALS — BP 132/78 | HR 95 | Temp 98.7°F | Resp 16 | Wt 121.7 lb

## 2016-10-26 DIAGNOSIS — R05 Cough: Secondary | ICD-10-CM | POA: Diagnosis not present

## 2016-10-26 DIAGNOSIS — E876 Hypokalemia: Secondary | ICD-10-CM

## 2016-10-26 DIAGNOSIS — R748 Abnormal levels of other serum enzymes: Secondary | ICD-10-CM

## 2016-10-26 DIAGNOSIS — R634 Abnormal weight loss: Secondary | ICD-10-CM | POA: Insufficient documentation

## 2016-10-26 DIAGNOSIS — R109 Unspecified abdominal pain: Secondary | ICD-10-CM

## 2016-10-26 DIAGNOSIS — T561X1A Toxic effect of mercury and its compounds, accidental (unintentional), initial encounter: Secondary | ICD-10-CM | POA: Diagnosis not present

## 2016-10-26 DIAGNOSIS — F609 Personality disorder, unspecified: Secondary | ICD-10-CM

## 2016-10-26 DIAGNOSIS — N200 Calculus of kidney: Secondary | ICD-10-CM | POA: Diagnosis not present

## 2016-10-26 DIAGNOSIS — R059 Cough, unspecified: Secondary | ICD-10-CM

## 2016-10-26 LAB — COMPREHENSIVE METABOLIC PANEL
ALT: 22 U/L (ref 14–54)
ANION GAP: 11 (ref 5–15)
AST: 26 U/L (ref 15–41)
Albumin: 4.7 g/dL (ref 3.5–5.0)
Alkaline Phosphatase: 71 U/L (ref 38–126)
BILIRUBIN TOTAL: 0.6 mg/dL (ref 0.3–1.2)
BUN: 8 mg/dL (ref 6–20)
CO2: 28 mmol/L (ref 22–32)
Calcium: 9.4 mg/dL (ref 8.9–10.3)
Chloride: 99 mmol/L — ABNORMAL LOW (ref 101–111)
Creatinine, Ser: 0.83 mg/dL (ref 0.44–1.00)
Glucose, Bld: 110 mg/dL — ABNORMAL HIGH (ref 65–99)
POTASSIUM: 3.8 mmol/L (ref 3.5–5.1)
Sodium: 138 mmol/L (ref 135–145)
TOTAL PROTEIN: 7.8 g/dL (ref 6.5–8.1)

## 2016-10-26 LAB — MAGNESIUM: MAGNESIUM: 2.2 mg/dL (ref 1.7–2.4)

## 2016-10-26 LAB — C-REACTIVE PROTEIN: CRP: <0.8 mg/dL (ref <1.0)

## 2016-10-26 MED ORDER — TRAZODONE HCL 50 MG PO TABS: 25.0000 mg | ORAL_TABLET | Freq: Every evening | ORAL | 1 refills | Status: DC | PRN

## 2016-10-26 MED ORDER — IOPAMIDOL (ISOVUE-300) INJECTION 61%
100.0000 mL | Freq: Once | INTRAVENOUS | Status: AC | PRN
  Administered 2016-10-26: 100 mL via INTRAVENOUS

## 2016-10-26 NOTE — Progress Notes (Signed)
BP 132/78   Pulse 95   Temp 98.7 F (37.1 C) (Oral)   Resp 16   Wt 121 lb 11.2 oz (55.2 kg)   SpO2 98%   BMI 22.26 kg/m    Subjective:    Patient ID: Alice Simmons, female    DOB: [birth year], 61 y.o.   MRN: [id number]  HPI: Alice Simmons is a 61 y.o. female  Chief Complaint  Patient presents with  . Leg Pain    right fibromylagia/nerve pain  . Weight Loss    HPI Patient is here with her husband She was in the ER for confusion She had a head CT, negative Also had labs; negative RPR; normal TSH Concentrated urine with 20 ketones Normal CBC Hypokalemia of 2.8 Mildly elevated AST at 42 Cr 1.08, GFR 55  She uses marijuana, smokes it in a pipe; not aware of it being laced with anything She does not smoke a lot Husband has noticed unusual behavioral She went to the hospital, was evaluated by the psychiatrist Dr. Weber Cooks When she gets to the hospital, she gets afraid and then appears fine She told him everything was fine even though she's not fine She denies any thoughts of self-harm, or hurting others  Appetite is not good Tired all the time; too much pain Waking up in the night Thinks something is wrong with her house Losing weight Having night sweats, come and go    Recent A1c of 5.4 on Apil 26, 2018 We are awaiting psychiatry referral and rheumatology referral    Head CT Oct 23, 2016: CLINICAL DATA:  Increasing headache  EXAM: CT HEAD WITHOUT AND WITH CONTRAST  TECHNIQUE: Contiguous axial images were obtained from the base of the skull through the vertex without and with intravenous contrast  CONTRAST:  12mL ISOVUE-300 IOPAMIDOL (ISOVUE-300) INJECTION 61%  COMPARISON:  MRI head 09/07/2014  FINDINGS: Brain: No evidence of acute infarction, hemorrhage, hydrocephalus, extra-axial collection or mass lesion/mass effect. Normal enhancement postcontrast administration.  Vascular: Negative for hyperdense vessel. Normal  arterial and venous enhancement.  Skull: Negative  Sinuses/Orbits: Negative  Other: None  IMPRESSION: Negative CT head with contrast.   Electronically Signed   By: Franchot Gallo M.D.   On: 10/23/2016 10:02  Depression screen [facility] 2/9 10/08/2016 06/03/2016 11/12/2015 08/14/2015 07/18/2015  Decreased Interest 1 0 0 0 0  Down, Depressed, Hopeless 1 0 0 1 0  PHQ - 2 Score 2 0 0 1 0  Altered sleeping 1 - - - -  Tired, decreased energy 1 - - - -  Change in appetite 1 - - - -  Feeling bad or failure about yourself  1 - - - -  Trouble concentrating 1 - - - -  Moving slowly or fidgety/restless 1 - - - -  Suicidal thoughts 0 - - - -  PHQ-9 Score 8 - - - -  Difficult doing work/chores Not difficult at all - - - -  Some encounter information is confidential and restricted. Go to Review Flowsheets activity to see all data.    Relevant past medical, surgical, family and social history reviewed Past Medical History:  Diagnosis Date  . Anxiety   . Arthritis 11/15/2012  . Brain tumor ([place])   . Breast pain   . Depression, controlled   . Diabetes mellitus without complication ([place])   . 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 Fibromyalgia   . Hyperlipidemia   . Hypertension, benign   . Insomnia   .  Obesity (BMI 30.0-34.9)   . Pain of both breasts    Past Surgical History:  Procedure Laterality Date  . ABDOMINAL HYSTERECTOMY    . ABDOMINAL HYSTERECTOMY  2010  . BILATERAL CARPAL TUNNEL RELEASE    . BREAST BIOPSY Bilateral 1980   EXCISIONAL - NEG  . BREAST BIOPSY Right 1985   CORE W/OUT CLIP - NEG  . BREAST SURGERY     benign breast cyst  . CARPAL TUNNEL RELEASE    . CHOLECYSTECTOMY  2014  . EYE SURGERY     cataract surger both eyes   Family History  Problem Relation Age of Onset  . Heart disease Mother   . Diabetes Mother   . Hypertension Mother   . Diabetes Father   . Hypertension Father   . Hyperlipidemia Sister   . Hyperlipidemia Brother   . Heart disease Brother   .  Hyperlipidemia Brother   . Hyperlipidemia Brother   . Drug abuse Brother   . Depression Brother   . Anxiety disorder Brother   . Hyperlipidemia Brother   . Breast cancer Paternal Aunt   . Neuropathy Neg Hx    Social History   Social History  . Marital status: Married    Spouse name: Hessie Diener  . Number of children: 2  . Years of education: 12   Occupational History  . Unemployed    Social History Main Topics  . Smoking status: Current Every Day Smoker    Packs/day: 1.00    Years: 20.00    Types: Cigarettes  . Smokeless tobacco: Never Used  . Alcohol use No     Comment: occasional  . Drug use: Yes    Types: Marijuana     Comment: smokes MJ 2 times per day  . Sexual activity: Not Currently   Other Topics Concern  . Not on file   Social History Narrative   Lives alone   Caffeine use: Drinks coffee (3 cups per day)   ** Merged History Encounter **        Interim medical history since last visit reviewed. Allergies and medications reviewed  Review of Systems Per HPI unless specifically indicated above     Objective:    BP 132/78   Pulse 95   Temp 98.7 F (37.1 C) (Oral)   Resp 16   Wt 121 lb 11.2 oz (55.2 kg)   SpO2 98%   BMI 22.26 kg/m   Wt Readings from Last 3 Encounters:  10/27/16 121 lb (54.9 kg)  10/26/16 121 lb 11.2 oz (55.2 kg)  10/23/16 130 lb (59 kg)    Physical Exam Physical Exam  Constitutional: She appears well-developed and well-nourished. No distress.  HENT:  Right Ear: External ear normal. No lacerations. No drainage or swelling. No foreign bodies. No mastoid tenderness. No decreased hearing is noted.  Left Ear: External ear normal. No lacerations. No drainage or swelling. No foreign bodies. No mastoid tenderness. No decreased hearing is noted.  Nose: Nose normal. No rhinorrhea.  Mouth/Throat: Oropharynx is clear and moist. No oral lesions. No dental abscesses. No oropharyngeal exudate.  Cardiovascular: Normal rate and regular  rhythm.   Pulmonary/Chest: Effort normal and breath sounds normal. She has no decreased breath sounds. She has no wheezes.  Abdominal: Soft. Normal appearance and bowel sounds are normal. She exhibits no mass. There is tenderness (left colic gutter, left flank).  Lymphadenopathy:    She has no cervical adenopathy.       Right: No supraclavicular  adenopathy present.       Left: No supraclavicular adenopathy present.  Neurological: She is alert. She displays no tremor.  No tics  Skin: She is not diaphoretic. No pallor.  Psychiatric: Her speech is not rapid and/or pressured and not tangential. She is not agitated, not aggressive, not hyperactive and not combative. She expresses no homicidal and no suicidal ideation.  When I first entered the room, she was laying on the table, did not open her eyes or respond to my entering at all; laid there for perhaps a minute while I talked to husband, then she coughed and sputtered and sat straight up, looking surprised; animated, talkative but not manic; began to cry and say "Dios mio" when talking about cancer work-up; axis II deferred  Results for orders placed or performed during the hospital encounter of 10/23/16  Urine culture  Result Value Ref Range   Specimen Description URINE, CLEAN CATCH    Special Requests Normal    Culture MULTIPLE SPECIES PRESENT, SUGGEST RECOLLECTION (A)    Report Status 10/24/2016 FINAL   Glucose, capillary  Result Value Ref Range   Glucose-Capillary 131 (H) 65 - 99 mg/dL  Comprehensive metabolic panel  Result Value Ref Range   Sodium 138 135 - 145 mmol/L   Potassium 2.8 (L) 3.5 - 5.1 mmol/L   Chloride 102 101 - 111 mmol/L   CO2 25 22 - 32 mmol/L   Glucose, Bld 131 (H) 65 - 99 mg/dL   BUN 15 6 - 20 mg/dL   Creatinine, Ser 1.08 (H) 0.44 - 1.00 mg/dL   Calcium 9.8 8.9 - 10.3 mg/dL   Total Protein 8.2 (H) 6.5 - 8.1 g/dL   Albumin 4.9 3.5 - 5.0 g/dL   AST 42 (H) 15 - 41 U/L   ALT 27 14 - 54 U/L   Alkaline Phosphatase  81 38 - 126 U/L   Total Bilirubin 0.7 0.3 - 1.2 mg/dL   GFR calc non Af Amer 55 (L) >60 mL/min   GFR calc Af Amer >60 >60 mL/min   Anion gap 11 5 - 15  CBC  Result Value Ref Range   WBC 7.2 3.6 - 11.0 K/uL   RBC 4.57 3.80 - 5.20 MIL/uL   Hemoglobin 14.3 12.0 - 16.0 g/dL   HCT 41.8 35.0 - 47.0 %   MCV 91.4 80.0 - 100.0 fL   MCH 31.3 26.0 - 34.0 pg   MCHC 34.2 32.0 - 36.0 g/dL   RDW 13.1 11.5 - 14.5 %   Platelets 437 150 - 440 K/uL  Urinalysis, Complete w Microscopic  Result Value Ref Range   Color, Urine YELLOW (A) YELLOW   APPearance HAZY (A) CLEAR   Specific Gravity, Urine >1.046 (H) 1.005 - 1.030   pH 6.0 5.0 - 8.0   Glucose, UA NEGATIVE NEGATIVE mg/dL   Hgb urine dipstick NEGATIVE NEGATIVE   Bilirubin Urine NEGATIVE NEGATIVE   Ketones, ur 20 (A) NEGATIVE mg/dL   Protein, ur NEGATIVE NEGATIVE mg/dL   Nitrite NEGATIVE NEGATIVE   Leukocytes, UA TRACE (A) NEGATIVE   RBC / HPF 0-5 0 - 5 RBC/hpf   WBC, UA 0-5 0 - 5 WBC/hpf   Bacteria, UA NONE SEEN NONE SEEN   Squamous Epithelial / LPF 6-30 (A) NONE SEEN   Mucous PRESENT   TSH  Result Value Ref Range   TSH 3.750 0.350 - 4.500 uIU/mL  RPR  Result Value Ref Range   RPR Ser Ql Non Reactive Non Reactive  Assessment & Plan:   Problem List Items Addressed This Visit      Other   Toxic effect of mercury    Check levels      Relevant Orders   Heavy Metals Profile, Urine   Personality disorder, unspecified   Abnormal weight loss - Primary   Relevant Orders   CT ABDOMEN PELVIS W CONTRAST (Completed)   ANA,IFA RA Diag Pnl w/rflx Tit/Patn   C-reactive protein   DG Chest 2 View (Completed)    Other Visit Diagnoses    Cough       Relevant Orders   DG Chest 2 View (Completed)   Abnormal liver enzymes       Relevant Orders   CT ABDOMEN PELVIS W CONTRAST (Completed)   COMPLETE METABOLIC PANEL WITH GFR   Left lateral abdominal pain       Relevant Orders   CT ABDOMEN PELVIS W CONTRAST (Completed)   Hypokalemia        Relevant Orders   Magnesium       Follow up plan: Return in about 1 week (around 11/02/2016).  An after-visit summary was printed and given to the patient at [place].  Please see the patient instructions which may contain other information and recommendations beyond what is mentioned above in the assessment and plan.  No orders of the defined types were placed in this encounter.   Orders Placed This Encounter  Procedures  . CT ABDOMEN PELVIS W CONTRAST  . DG Chest 2 View  . Heavy Metals Profile, Urine  . COMPLETE METABOLIC PANEL WITH GFR  . Magnesium  . ANA,IFA RA Diag Pnl w/rflx Tit/Patn  . C-reactive protein

## 2016-10-26 NOTE — Patient Instructions (Signed)
Please have the labs and scan done today Call for help if needed

## 2016-10-26 NOTE — Telephone Encounter (Signed)
Rx sent for trazodone CT scan results sent through [place] [facility]

## 2016-10-26 NOTE — Assessment & Plan Note (Signed)
Check levels 

## 2016-10-26 NOTE — Telephone Encounter (Signed)
Husband wants to see about giving her something to help her sleep at night.

## 2016-10-26 NOTE — Telephone Encounter (Signed)
Patient's husband notified  

## 2016-10-27 ENCOUNTER — Emergency Department
Admission: EM | Admit: 2016-10-27 | Discharge: 2016-10-28 | Disposition: A | Discharge status: Other Acute Inpatient Hospital | Payer: Medicare Other | Attending: Emergency Medicine | Admitting: Emergency Medicine

## 2016-10-27 ENCOUNTER — Encounter: Payer: Self-pay | Admitting: Emergency Medicine

## 2016-10-27 DIAGNOSIS — G894 Chronic pain syndrome: Secondary | ICD-10-CM | POA: Insufficient documentation

## 2016-10-27 DIAGNOSIS — Z79899 Other long term (current) drug therapy: Secondary | ICD-10-CM | POA: Insufficient documentation

## 2016-10-27 DIAGNOSIS — F329 Major depressive disorder, single episode, unspecified: Secondary | ICD-10-CM | POA: Insufficient documentation

## 2016-10-27 DIAGNOSIS — E119 Type 2 diabetes mellitus without complications: Secondary | ICD-10-CM | POA: Insufficient documentation

## 2016-10-27 DIAGNOSIS — F29 Unspecified psychosis not due to a substance or known physiological condition: Secondary | ICD-10-CM

## 2016-10-27 DIAGNOSIS — F32A Depression, unspecified: Secondary | ICD-10-CM

## 2016-10-27 DIAGNOSIS — F17210 Nicotine dependence, cigarettes, uncomplicated: Secondary | ICD-10-CM | POA: Insufficient documentation

## 2016-10-27 DIAGNOSIS — I10 Essential (primary) hypertension: Secondary | ICD-10-CM | POA: Insufficient documentation

## 2016-10-27 LAB — URINE DRUG SCREEN, QUALITATIVE (ARMC ONLY)
AMPHETAMINES, UR SCREEN: NOT DETECTED
Barbiturates, Ur Screen: NOT DETECTED
Benzodiazepine, Ur Scrn: NOT DETECTED
Cannabinoid 50 Ng, Ur ~~LOC~~: POSITIVE — AB
Cocaine Metabolite,Ur ~~LOC~~: NOT DETECTED
MDMA (ECSTASY) UR SCREEN: NOT DETECTED
METHADONE SCREEN, URINE: NOT DETECTED
OPIATE, UR SCREEN: NOT DETECTED
Phencyclidine (PCP) Ur S: NOT DETECTED
Tricyclic, Ur Screen: NOT DETECTED

## 2016-10-27 LAB — MISC LABCORP TEST (SEND OUT): Labcorp test code: 340897

## 2016-10-27 MED ORDER — QUETIAPINE FUMARATE 25 MG PO TABS
100.0000 mg | ORAL_TABLET | Freq: Every day | ORAL | Status: DC
  Administered 2016-10-27: 100 mg via ORAL
  Filled 2016-10-27 (×3): qty 4

## 2016-10-27 MED ORDER — ATORVASTATIN CALCIUM 20 MG PO TABS
20.0000 mg | ORAL_TABLET | Freq: Every day | ORAL | Status: DC
  Administered 2016-10-27 – 2016-10-28 (×2): 20 mg via ORAL
  Filled 2016-10-27 (×3): qty 1

## 2016-10-27 MED ORDER — AMLODIPINE BESYLATE 5 MG PO TABS
10.0000 mg | ORAL_TABLET | Freq: Every day | ORAL | Status: DC
  Administered 2016-10-28: 10 mg via ORAL
  Filled 2016-10-27 (×2): qty 2

## 2016-10-27 MED ORDER — LORAZEPAM 1 MG PO TABS
1.0000 mg | ORAL_TABLET | ORAL | Status: DC | PRN
  Administered 2016-10-27 – 2016-10-28 (×3): 1 mg via ORAL
  Filled 2016-10-27 (×3): qty 1

## 2016-10-27 MED ORDER — TOPIRAMATE 100 MG PO TABS
200.0000 mg | ORAL_TABLET | Freq: Every day | ORAL | Status: DC
  Administered 2016-10-27: 200 mg via ORAL
  Filled 2016-10-27: qty 2

## 2016-10-27 MED ORDER — LOSARTAN POTASSIUM 50 MG PO TABS
50.0000 mg | ORAL_TABLET | Freq: Every day | ORAL | Status: DC
  Administered 2016-10-28: 50 mg via ORAL
  Filled 2016-10-27 (×2): qty 1

## 2016-10-27 MED ORDER — HALOPERIDOL 0.5 MG PO TABS
2.0000 mg | ORAL_TABLET | Freq: Four times a day (QID) | ORAL | Status: DC | PRN
Start: 2016-10-27 — End: 2016-10-28
  Administered 2016-10-27 – 2016-10-28 (×2): 2 mg via ORAL
  Filled 2016-10-27 (×2): qty 4

## 2016-10-27 NOTE — ED Notes (Signed)
Pt tearful upon arrival to unit. Pt stating she is confused and just wants to be happy.  Pt's husband visiting and patient is calm, laying in bed. Behavioral health AD is allowing husband to stay until 1400 to help better orient pt. Maintained on 15 minute checks and observation by security camera for safety.

## 2016-10-27 NOTE — ED Notes (Signed)
Meal tray given at this time 

## 2016-10-27 NOTE — Consult Note (Signed)
[facility] Consult   Reason for Consult:  Consult for 61 year old woman who came voluntarily to the emergency room complaining of subjective mental status change Referring Physician:  Alfred Levins Patient Identification: Alice Simmons MRN:  [id number] Principal Diagnosis: Psychosis Diagnosis:   Patient Active Problem List   Diagnosis Date Noted  . Personality disorder, unspecified [F60.9] 10/27/2016  . Psychosis [F29] 10/27/2016  . Memory loss [R41.3] 10/23/2016  . Unrefreshed by sleep [G47.8] 08/13/2016  . Encounter for medication monitoring [Z51.81] 06/24/2016  . Substance abuse [F19.10] 06/24/2016  . Syncope [R55] 06/24/2016  . Bradycardia [R00.1] 06/03/2016  . Cannabis abuse [F12.10] 02/15/2016  . Severe episode of recurrent major depressive disorder, without psychotic features ([place]) [F33.2] 01/23/2016  . Generalized anxiety disorder [F41.1] 01/23/2016  . Panic disorder [F41.0] 01/23/2016  . Pain [R52] 01/01/2016  . Fasciculations [R25.3] 11/21/2015  . Toxic effect of mercury [T56.1X1A] 10/15/2015  . Small fiber neuropathy [G62.9] 10/10/2015  . Fatigue [R53.83] 10/10/2015  . Abnormal weight loss [R63.4] 10/10/2015  . Encounter for therapeutic drug level monitoring [Z51.81] 10/10/2015  . Lymphocytosis [D72.820] 10/10/2015  . Elevated liver enzymes [R74.8] 10/10/2015  . H/O cosmetic surgery [Z98.890] 08/14/2015  . Chronic lower extremity pain (Right) [Z61.096, G89.29] 07/18/2015  . Hypertension goal BP (blood pressure) < 140/90 [I10] 06/05/2015  . Bilateral tinnitus [H93.13] 06/05/2015  . Left breast mass [N63.20] 06/05/2015  . Neurogenic pain [M79.2] 05/07/2015  . Musculoskeletal pain [M79.1] 05/07/2015  . Diffuse myofascial pain syndrome [M79.1] 05/07/2015  . Allodynia [R20.8] 05/07/2015  . 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 Marijuana abuse, continuous [F12.10] 04/25/2015  . Fibromyalgia [M79.7] 04/20/2015  . Generalized  Paresthesias [R20.2] 04/20/2015  . Paresthesias with subjective weakness [R20.2, R53.1] 04/20/2015  . Chronic pain [G89.29] 04/20/2015  . Sensitivity to sunlight [Z91.09] 04/20/2015  . Continuous illicit drug use [E45.40] 03/21/2015  . Chronic low back pain (Location of Primary Source of Pain) (Bilateral) (R>L) [M54.5, G89.29] 03/21/2015  . Chronic pain syndrome [G89.4] 03/21/2015  . Lumbar facet syndrome (Bilateral) (R>L) [M46.96] 03/21/2015  . Lumbar radicular pain [M54.16] 03/21/2015  . Lumbosacral radiculopathy [M54.17] 03/21/2015  . Lumbar spondylosis [M47.26] 03/21/2015  . Clinical depression [F32.9] 11/28/2014  . Diabetes mellitus type 2, controlled, without complications ([place]) [J81.1] 11/28/2014  . Mass of left breast on mammogram [N63.20] 11/28/2014  . Back pain, thoracic [M54.6] 11/28/2014  . Hyperlipidemia [E78.5] 11/28/2014  . Vitamin D deficiency [E55.9] 11/28/2014  . Primary meningioma of optic nerve sheath ([place]) [D32.0] 11/28/2014  . Bipolar depression ([place]) [F31.30] 12/29/2013  . Self-inflicted injury [B14.78] 12/29/2013  . Migraine headache [G43.909] 08/17/2013  . Chronic insomnia [F51.04] 08/17/2013  . Tobacco abuse [Z72.0] 05/01/2013  . Apnea, sleep [G47.30] 03/02/2012  . Anxiety [F41.9] 03/04/2011    Total Time spent with patient: 1 hour  Subjective:   Alice Simmons is a 61 y.o. female patient admitted with "I'm just not myself".  HPI:  Patient interviewed. Chart reviewed including old notes from other hospitals. The patient's consent I also spoke with her husband and with her son and daughter. Patient reports that she doesn't feel like herself. She has a hard time describing exactly what that means or being any more detailed about her symptoms. She does admit to having auditory hallucinations but cannot describe what they sounded like. Sleep pattern has been erratic. Mood feels up and down. Patient says that she doesn't feel right. She can't be any  more clear about the time course of it. She denies  any wish to harm herself or harm anyone else. She has been eating poorly however and has lost some weight. Patient admits to continued use of marijuana regularly but denies other substance abuse. Her husband reports that the patient's behavior has been worsening for several weeks. She has become more and more erratic. She will have spells of seeming to be fairly stable but is increasingly having frequent spells where she will become agitated and sometimes starts screaming for no clear reason. Family is worried about her ability to care for herself. Patient is not currently receiving any psychiatric treatment. We saw the patient for consultation in the emergency room prior to the weekend and at that time her chief complaint was memory problems. There was not as much collateral information at that point and she did not seem to be sick enough to require hospital treatment. She has not gone for any outpatient mental health treatment. Husband reports that the patient's mother died within the last year and that's the biggest emotional stress or they can pinpoint.  Medical history: Patient has had a history of multiple chronic medical problems but nothing particularly severe. Has elevated cholesterol. The family is concerned about a diagnosis of a "brain tumor". I tracked down what they're talking about. 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 No specific symptoms related to it were identified and it was not thought to require any treatment at that point. She has subsequently had a CAT scan here which did not note seeing the lesion at all.  Substance abuse history: Patient admits to ongoing marijuana abuse. Says it's a few times a week. Minimizes the degree to which she thinks it's a problem. Appears to of been a chronic issue. Denies any other drug or alcohol abuse no evidence of any  treatment for in the past.  Social history: Patient lives with her husband. She has adult children who live in the community as well. Patient was interviewed today with the assistance of a hospital Spanish language interpreter although the patient actually prefers to answer most of my questions in [place]. Her English actually seems to be pretty good and her husband and children are fluent. Patient is not currently working outside the home.  Past Psychiatric History: Evidently she had psychiatric treatment while living in [place] in the past. Diagnosis unclear. Family and patient remember her being prescribed antidepressants. Don't remember any specific benefit. No known history of suicide attempts or violence.  Risk to Self: Is patient at risk for suicide?: No Risk to Others:   Prior Inpatient Therapy:   Prior Outpatient Therapy:    Past Medical History:  Past Medical History:  Diagnosis Date  . Anxiety   . Arthritis 11/15/2012  . Brain tumor ([place])   . Breast pain   . Depression, controlled   . Diabetes mellitus without complication ([place])   . 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 Fibromyalgia   . Hyperlipidemia   . Hypertension, benign   . Insomnia   . Obesity (BMI 30.0-34.9)   . Pain of both breasts     Past Surgical History:  Procedure Laterality Date  . ABDOMINAL HYSTERECTOMY    . ABDOMINAL HYSTERECTOMY  2010  . BILATERAL CARPAL TUNNEL RELEASE    . BREAST BIOPSY Bilateral 1980   EXCISIONAL - NEG  . BREAST BIOPSY Right 1985   CORE W/OUT CLIP - NEG  . BREAST SURGERY  benign breast cyst  . CARPAL TUNNEL RELEASE    . CHOLECYSTECTOMY  2014  . EYE SURGERY     cataract surger both eyes   Family History:  Family History  Problem Relation Age of Onset  . Heart disease Mother   . Diabetes Mother   . Hypertension Mother   . Diabetes Father   . Hypertension Father   . Hyperlipidemia Sister   . Hyperlipidemia Brother   . Heart disease Brother   . Hyperlipidemia Brother   .  Hyperlipidemia Brother   . Drug abuse Brother   . Depression Brother   . Anxiety disorder Brother   . Hyperlipidemia Brother   . Breast cancer Paternal Aunt   . Neuropathy Neg Hx    Family Psychiatric  History: One of her sons does have a history of some mood and anxiety problems and ADHD but otherwise we don't have a clear family history of anyone Social History:  History  Alcohol Use No    Comment: occasional     History  Drug Use  . Types: Marijuana    Comment: smokes MJ 2 times per day    Social History   Social History  . Marital status: Married    Spouse name: Hessie Diener  . Number of children: 2  . Years of education: 12   Occupational History  . Unemployed    Social History Main Topics  . Smoking status: Current Every Day Smoker    Packs/day: 1.00    Years: 20.00    Types: Cigarettes  . Smokeless tobacco: Never Used  . Alcohol use No     Comment: occasional  . Drug use: Yes    Types: Marijuana     Comment: smokes MJ 2 times per day  . Sexual activity: Not Currently   Other Topics Concern  . None   Social History Narrative   Lives alone   Caffeine use: Drinks coffee (3 cups per day)   ** Merged History Encounter **       Additional Social History:    Allergies:   Allergies  Allergen Reactions  . Aspirin Swelling  . Penicillins Swelling    Labs:  Results for orders placed or performed during the hospital encounter of 10/26/16 (from the past 48 hour(s))  Miscellaneous [facility] test (send-out)     Status: None   Collection Time: 10/26/16  2:04 PM  Result Value Ref Range   [facility] test code 371,062    [facility] test name ANA,IFA RA DIAG PNL    Misc [facility] result COMMENT     Comment: (NOTE) Test Ordered: [id number] ANA Antinuclear Antibodies, IFA    Negative                  BN                                       Negative   <1:80                                     Borderline  1:80                                     Positive    >1:80 Please Note:  Comment                   BN   ANA Multiplex methodology was designed to detect up to 11 antibodies of the 100+ antibodies that may be detected by ANA IFA methodology. Performed At: [facility] [place], [place] [id number] Lindon Romp MD QQ:[id number]   Comprehensive metabolic panel     Status: Abnormal   Collection Time: 10/26/16  2:04 PM  Result Value Ref Range   Sodium 138 135 - 145 mmol/L   Potassium 3.8 3.5 - 5.1 mmol/L   Chloride 99 (L) 101 - 111 mmol/L   CO2 28 22 - 32 mmol/L   Glucose, Bld 110 (H) 65 - 99 mg/dL   BUN 8 6 - 20 mg/dL   Creatinine, Ser 0.83 0.44 - 1.00 mg/dL   Calcium 9.4 8.9 - 10.3 mg/dL   Total Protein 7.8 6.5 - 8.1 g/dL   Albumin 4.7 3.5 - 5.0 g/dL   AST 26 15 - 41 U/L   ALT 22 14 - 54 U/L   Alkaline Phosphatase 71 38 - 126 U/L   Total Bilirubin 0.6 0.3 - 1.2 mg/dL   GFR calc non Af Amer >60 >60 mL/min   GFR calc Af Amer >60 >60 mL/min    Comment: (NOTE) The eGFR has been calculated using the CKD EPI equation. This calculation has not been validated in all clinical situations. eGFR's persistently <60 mL/min signify possible Chronic Kidney Disease.    Anion gap 11 5 - 15  Magnesium     Status: None   Collection Time: 10/26/16  2:04 PM  Result Value Ref Range   Magnesium 2.2 1.7 - 2.4 mg/dL  C-reactive protein     Status: None   Collection Time: 10/26/16  2:04 PM  Result Value Ref Range   CRP <0.8 <1.0 mg/dL    Comment: Performed at [facility], [place] [address], [place], [place] [address]    No current facility-administered medications for this encounter.    Current Outpatient Prescriptions  Medication Sig Dispense Refill  . amLODipine (NORVASC) 10 MG tablet TAKE 1 TABLET(10 MG) BY MOUTH DAILY 90 tablet 2  . atorvastatin (LIPITOR) 20 MG tablet TAKE 1 TABLET(20 MG) BY MOUTH AT BEDTIME 90 tablet 0  . benzonatate (TESSALON PERLES) 100 MG capsule Take 1 capsule (100 mg total)  by mouth every 8 (eight) hours as needed for cough. 30 capsule 0  . chlorhexidine (PERIDEX) 0.12 % solution   0  . DULoxetine (CYMBALTA) 30 MG capsule Take 1 capsule (30 mg total) by mouth daily. For 1 week, then two a day; stop paxil 53 capsule 0  . gabapentin (NEURONTIN) 600 MG tablet TAKE 1 TABLET BY MOUTH THREE TIMES DAILY 90 tablet 3  . losartan (COZAAR) 50 MG tablet TAKE 1 TABLET BY MOUTH DAILY 30 tablet 5  . potassium chloride (K-DUR) 10 MEQ tablet Take 1 tablet (10 mEq total) by mouth daily. 5 tablet 0  . SF 1.1 % GEL dental gel U D HS AFTER BRUSHING  3  . topiramate (TOPAMAX) 100 MG tablet TAKE 2 TABLETS(200 MG) BY MOUTH AT BEDTIME 180 tablet 1  . traZODone (DESYREL) 50 MG tablet TAKE 1/2 TO 1 TABLET BY MOUTH AT BEDTIME AS NEEDED FOR SLEEP 90 tablet 1    Musculoskeletal: Strength & Muscle Tone: within normal limits Gait & Station: normal Patient leans: N/A  Psychiatric Specialty Exam: Physical Exam  Nursing note and  vitals reviewed. Constitutional: She appears well-developed and well-nourished.  HENT:  Head: Normocephalic and atraumatic.  Eyes: Conjunctivae are normal. Pupils are equal, round, and reactive to light.  Neck: Normal range of motion.  Cardiovascular: Regular rhythm and normal heart sounds.   Respiratory: Effort normal. No respiratory distress.  GI: Soft.  Musculoskeletal: Normal range of motion.  Neurological: She is alert.  Skin: Skin is warm and dry.  Psychiatric: Her affect is blunt. Her speech is delayed and slurred. She is slowed. Thought content is paranoid. Cognition and memory are impaired. She expresses impulsivity. She exhibits a depressed mood. She expresses no homicidal and no suicidal ideation. She exhibits abnormal recent memory.    Review of Systems  Constitutional: Negative.   HENT: Negative.   Eyes: Negative.   Respiratory: Negative.   Cardiovascular: Negative.   Gastrointestinal: Negative.   Musculoskeletal: Negative.   Skin: Negative.    Neurological: Negative.   Psychiatric/Behavioral: Positive for depression, hallucinations and memory loss. Negative for substance abuse and suicidal ideas. The patient is nervous/anxious and has insomnia.     Blood pressure 108/65, pulse 99, temperature 98.7 F (37.1 C), temperature source Oral, resp. rate 20, height 5' 2"  (1.575 m), weight 54.9 kg (121 lb), SpO2 98 %.Body mass index is 22.13 kg/m.  General Appearance: Casual  Eye Contact:  Good  Speech:  Garbled and Slow  Volume:  Decreased  Mood:  Anxious  Affect:  Congruent  Thought Process:  Disorganized  Orientation:  Full (Time, Place, and Person)  Thought Content:  Illogical, Paranoid Ideation and Tangential  Suicidal Thoughts:  No  Homicidal Thoughts:  No  Memory:  Immediate;   Fair Recent;   Fair Remote;   Fair  Judgement:  Impaired  Insight:  Shallow  Psychomotor Activity:  Normal  Concentration:  Concentration: Fair  Recall:  [employer] of Knowledge:  Fair  Language:  Poor  Akathisia:  No  Handed:  Right  AIMS (if indicated):     Assets:  Desire for Improvement Housing Intimacy Social Support  ADL's:  Intact  Cognition:  Impaired,  Mild  Sleep:        Treatment Plan Summary: Daily contact with patient to assess and evaluate symptoms and progress in treatment, Medication management and Plan 61 year old woman presents to the hospital with what sounds like worsening psychotic symptoms of been getting worse for several weeks. She is vague about her history but it's clear that she is having more emotional outbursts and disorganized thinking and hallucinations that have become more and more disruptive to where the family does not feel she is safe. Patient is not currently receiving outpatient treatment. She does have a past history of psychiatric problems although specifically what is unclear. I doubt very much that this history of a possible meningioma has anything to do with her symptoms. My guess is that more likely  she has bipolar disorder and is having a mixed episode with psychotic features. Patient and family are insistent that they do not feel safe with her coming home. We will admit the patient voluntarily to the psychiatry ward. Full set of labs will be reviewed. Consider starting her on some medication for mood stabilization. Patient and family agreeable to the plan.  Disposition: Recommend psychiatric Inpatient admission when medically cleared. Supportive therapy provided about ongoing stressors.  Alethia Berthold, MD 10/27/2016 12:49 PM

## 2016-10-27 NOTE — ED Notes (Signed)
All belongings transferred with the pt to [facility] locked belongings up in locker room

## 2016-10-27 NOTE — ED Provider Notes (Signed)
[facility] Emergency Department Provider Note  ____________________________________________  Time seen: Approximately 3:23 PM  I have reviewed the triage vital signs and the nursing notes.   HISTORY  Chief Complaint Psychiatric Evaluation   HPI Alice Simmons is a 61 y.o. female with a history of anxiety, depression, fibromyalgia presents for psychiatric evaluation. Patient reports that she hasn't felt like herself for about a year. She's been very sad since the death of her mother. She endorses intermittent auditory hallucinations and tells me that she hears her daughter talking to her. According to the husband she has had trouble sleeping for several months. She has moments where she is happy than other ones are she is really upset. She denies suicidal ideation or prior suicidal attempts. She endorses marijuana use but denies other drug use or alcohol. Patient's husband is worried the patient is not able to take care of herself anymore. Patient was seen here 4 days ago and evaluated by psychiatry and discharged home. Husband is concerned the patient is not better and no medications were started during the last admission. Patient denies headache, chest pain, fever, shortness of breath, abdominal pain, nausea, vomiting, diarrhea.  Past Medical History:  Diagnosis Date  . Anxiety   . Arthritis 11/15/2012  . Brain tumor ([place])   . Breast pain   . Depression, controlled   . Diabetes mellitus without complication ([place])   . 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 Fibromyalgia   . Hyperlipidemia   . Hypertension, benign   . Insomnia   . Obesity (BMI 30.0-34.9)   . Pain of both breasts     Patient Active Problem List   Diagnosis Date Noted  . Personality disorder, unspecified 10/27/2016  . Psychosis 10/27/2016  . Memory loss 10/23/2016  . Unrefreshed by sleep 08/13/2016  . Encounter for medication monitoring 06/24/2016  . Substance abuse 06/24/2016  .  Syncope 06/24/2016  . Bradycardia 06/03/2016  . Cannabis abuse 02/15/2016  . Severe episode of recurrent major depressive disorder, without psychotic features ([place]) 01/23/2016  . Generalized anxiety disorder 01/23/2016  . Panic disorder 01/23/2016  . Pain 01/01/2016  . Fasciculations 11/21/2015  . Toxic effect of mercury 10/15/2015  . Small fiber neuropathy 10/10/2015  . Fatigue 10/10/2015  . Abnormal weight loss 10/10/2015  . Encounter for therapeutic drug level monitoring 10/10/2015  . Lymphocytosis 10/10/2015  . Elevated liver enzymes 10/10/2015  . H/O cosmetic surgery 08/14/2015  . Chronic lower extremity pain (Right) 07/18/2015  . Hypertension goal BP (blood pressure) < 140/90 06/05/2015  . Bilateral tinnitus 06/05/2015  . Left breast mass 06/05/2015  . Neurogenic pain 05/07/2015  . Musculoskeletal pain 05/07/2015  . Diffuse myofascial pain syndrome 05/07/2015  . Allodynia 05/07/2015  . 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 Marijuana abuse, continuous 04/25/2015  . Fibromyalgia 04/20/2015  . Generalized Paresthesias 04/20/2015  . Paresthesias with subjective weakness 04/20/2015  . Chronic pain 04/20/2015  . Sensitivity to sunlight 04/20/2015  . Continuous illicit drug use 04/08/8526  . Chronic low back pain (Location of Primary Source of Pain) (Bilateral) (R>L) 03/21/2015  . Chronic pain syndrome 03/21/2015  . Lumbar facet syndrome (Bilateral) (R>L) 03/21/2015  . Lumbar radicular pain 03/21/2015  . Lumbosacral radiculopathy 03/21/2015  . Lumbar spondylosis 03/21/2015  . Clinical depression 11/28/2014  . Diabetes mellitus type 2, controlled, without complications ([place]) 78/24/2353  . Mass of left breast on mammogram 11/28/2014  . Back pain, thoracic 11/28/2014  . Hyperlipidemia 11/28/2014  . Vitamin D deficiency  11/28/2014  . 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 Bipolar depression ([place]) 12/29/2013  . Self-inflicted injury 42/59/5638  .  Migraine headache 08/17/2013  . Chronic insomnia 08/17/2013  . Tobacco abuse 05/01/2013  . Apnea, sleep 03/02/2012  . Anxiety 03/04/2011    Past Surgical History:  Procedure Laterality Date  . ABDOMINAL HYSTERECTOMY    . ABDOMINAL HYSTERECTOMY  2010  . BILATERAL CARPAL TUNNEL RELEASE    . BREAST BIOPSY Bilateral 1980   EXCISIONAL - NEG  . BREAST BIOPSY Right 1985   CORE W/OUT CLIP - NEG  . BREAST SURGERY     benign breast cyst  . CARPAL TUNNEL RELEASE    . CHOLECYSTECTOMY  2014  . EYE SURGERY     cataract surger both eyes    Prior to Admission medications   Medication Sig Start Date End Date Taking? Authorizing Provider  amLODipine (NORVASC) 10 MG tablet TAKE 1 TABLET(10 MG) BY MOUTH DAILY 09/06/16  Yes Lada, Satira Anis, MD  atorvastatin (LIPITOR) 20 MG tablet TAKE 1 TABLET(20 MG) BY MOUTH AT BEDTIME 09/27/16  Yes Lada, Satira Anis, MD  benzonatate (TESSALON PERLES) 100 MG capsule Take 1 capsule (100 mg total) by mouth every 8 (eight) hours as needed for cough. 10/08/16  Yes Lada, Satira Anis, MD  DULoxetine (CYMBALTA) 30 MG capsule Take 1 capsule (30 mg total) by mouth daily. For 1 week, then two a day; stop paxil 10/08/16  Yes Lada, Satira Anis, MD  gabapentin (NEURONTIN) 600 MG tablet TAKE 1 TABLET BY MOUTH THREE TIMES DAILY 07/16/16  Yes Lada, Satira Anis, MD  losartan (COZAAR) 50 MG tablet TAKE 1 TABLET BY MOUTH DAILY 07/01/16  Yes Lada, Satira Anis, MD  potassium chloride (K-DUR) 10 MEQ tablet Take 1 tablet (10 mEq total) by mouth daily. 10/23/16  Yes Delman Kitten, MD  topiramate (TOPAMAX) 100 MG tablet TAKE 2 TABLETS(200 MG) BY MOUTH AT BEDTIME 09/15/16  Yes Lada, Satira Anis, MD  traZODone (DESYREL) 50 MG tablet TAKE 1/2 TO 1 TABLET BY MOUTH AT BEDTIME AS NEEDED FOR SLEEP 10/26/16  Yes Lada, Satira Anis, MD    Allergies Aspirin and Penicillins  Family History  Problem Relation Age of Onset  . Heart disease Mother   . Diabetes Mother   . Hypertension Mother   . Diabetes Father   .  Hypertension Father   . Hyperlipidemia Sister   . Hyperlipidemia Brother   . Heart disease Brother   . Hyperlipidemia Brother   . Hyperlipidemia Brother   . Drug abuse Brother   . Depression Brother   . Anxiety disorder Brother   . Hyperlipidemia Brother   . Breast cancer Paternal Aunt   . Neuropathy Neg Hx     Social History Social History  Substance Use Topics  . Smoking status: Current Every Day Smoker    Packs/day: 1.00    Years: 20.00    Types: Cigarettes  . Smokeless tobacco: Never Used  . Alcohol use No     Comment: occasional    Review of Systems  Constitutional: Negative for fever. Eyes: Negative for visual changes. ENT: Negative for sore throat. Neck: No neck pain  Cardiovascular: Negative for chest pain. Respiratory: Negative for shortness of breath. Gastrointestinal: Negative for abdominal pain, vomiting or diarrhea. Genitourinary: Negative for dysuria. Musculoskeletal: Negative for back pain. Skin: Negative for rash. Neurological: Negative for headaches, weakness or numbness. Psych: No SI or HI. + depression and AH  ____________________________________________   PHYSICAL EXAM:  VITAL SIGNS: ED Triage Vitals  Enc Vitals Group     BP 10/27/16 1046 108/65     Pulse Rate 10/27/16 1046 99     Resp 10/27/16 1046 20     Temp 10/27/16 1045 98.7 F (37.1 C)     Temp Source 10/27/16 1045 Oral     SpO2 10/27/16 1046 98 %     Weight 10/27/16 1046 121 lb (54.9 kg)     Height 10/27/16 1046 5\' 2"  (1.575 m)     Head Circumference --      Peak Flow --      Pain Score 10/27/16 1147 0     Pain Loc --      Pain Edu? --      Excl. in [place]? --     Constitutional: Alert and oriented. Well appearing and in no apparent distress. HEENT:      Head: Normocephalic and atraumatic.         Eyes: Conjunctivae are normal. Sclera is non-icteric. EOMI. PERRL      Mouth/Throat: Mucous membranes are moist.       Neck: Supple with no signs of meningismus. Cardiovascular:  Regular rate and rhythm. No murmurs, gallops, or rubs. 2+ symmetrical distal pulses are present in all extremities. No JVD. Respiratory: Normal respiratory effort. Lungs are clear to auscultation bilaterally. No wheezes, crackles, or rhonchi.  Gastrointestinal: Soft, non tender, and non distended with positive bowel sounds. No rebound or guarding. Genitourinary: No CVA tenderness. Musculoskeletal: Nontender with normal range of motion in all extremities. No edema, cyanosis, or erythema of extremities. Neurologic: Normal speech and language. Face is symmetric. Moving all extremities. No gross focal neurologic deficits are appreciated. Skin: Skin is warm, dry and intact. No rash noted. Psychiatric: Mood and affect are normal. Speech and behavior are normal.  ____________________________________________   LABS (all labs ordered are listed, but only abnormal results are displayed)  Labs Reviewed  URINE DRUG SCREEN, QUALITATIVE ([facility] ONLY)   ____________________________________________  EKG  none  ____________________________________________  RADIOLOGY  none  ____________________________________________   PROCEDURES  Procedure(s) performed: None Procedures Critical Care performed:  None ____________________________________________   INITIAL IMPRESSION / ASSESSMENT AND PLAN / ED COURSE   61 y.o. female with a history of anxiety, depression, fibromyalgia presents for psychiatric evaluation for psychiatric declined and inability to care for self which has been progressively worse during the last year since the death of her mother. Patient denies any SI or HI. She is here voluntarily. Evaluated by Dr. Weber Cooks. Had blood work recentlywith no acute findings. No indication for repeat lab work. Will consult psych for further management.      Pertinent labs & imaging results that were available during my care of the patient were reviewed by me and considered in my medical decision  making (see chart for details).    ____________________________________________   FINAL CLINICAL IMPRESSION(S) / ED DIAGNOSES  Final diagnoses:  Depression, unspecified depression type      NEW MEDICATIONS STARTED DURING THIS VISIT:  New Prescriptions   No medications on file     Note:  This document was prepared using Dragon voice recognition software and may include unintentional dictation errors.    Rudene Re, MD 10/27/16 [phone]

## 2016-10-27 NOTE — BH Assessment (Signed)
Assessment Note  Alice Simmons Freada Bergeron is an 61 y.o. female who presents to the ER due to her husband had concerns about her mental state. She has had an increase of confusion and been disoriented. According to the patient's EMR, the last time this happened was 11/2013. It resulted in a psychiatric hospitalization because of "cutting her stomach with a knife to take out the fat."  During the interview with the patient, she was confused and had a difficult time answering questions. Majority of her answers was, "I'm confused and I don't know." She had brief moments when she was lucid but resort back to stating she was confused and didn't' know.    Patient does not have a history of violence or aggression. She have no involvement with the legal system.  She denies the use of mind-altering substance. However, her UDS was positive for cannabis.   Diagnosis: Bipolar  Past Medical History:  Past Medical History:  Diagnosis Date  . Anxiety   . Arthritis 11/15/2012  . Brain tumor ([place])   . Breast pain   . Depression, controlled   . Diabetes mellitus without complication ([place])   . 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 Fibromyalgia   . Hyperlipidemia   . Hypertension, benign   . Insomnia   . Obesity (BMI 30.0-34.9)   . Pain of both breasts     Past Surgical History:  Procedure Laterality Date  . ABDOMINAL HYSTERECTOMY    . ABDOMINAL HYSTERECTOMY  2010  . BILATERAL CARPAL TUNNEL RELEASE    . BREAST BIOPSY Bilateral 1980   EXCISIONAL - NEG  . BREAST BIOPSY Right 1985   CORE W/OUT CLIP - NEG  . BREAST SURGERY     benign breast cyst  . CARPAL TUNNEL RELEASE    . CHOLECYSTECTOMY  2014  . EYE SURGERY     cataract surger both eyes    Family History:  Family History  Problem Relation Age of Onset  . Heart disease Mother   . Diabetes Mother   . Hypertension Mother   . Diabetes Father   . Hypertension Father   . Hyperlipidemia Sister   . Hyperlipidemia Brother   . Heart disease Brother    . Hyperlipidemia Brother   . Hyperlipidemia Brother   . Drug abuse Brother   . Depression Brother   . Anxiety disorder Brother   . Hyperlipidemia Brother   . Breast cancer Paternal Aunt   . Neuropathy Neg Hx     Social History:  reports that she has been smoking Cigarettes.  She has a 20.00 pack-year smoking history. She has never used smokeless tobacco. She reports that she uses drugs, including Marijuana. She reports that she does not drink alcohol.  Additional Social History:  Alcohol / Drug Use Pain Medications: See PTA Prescriptions: See PTA Over the Counter: See PTA History of alcohol / drug use?: No history of alcohol / drug abuse Longest period of sobriety (when/how long): n/a Negative Consequences of Use:  (n/a) Withdrawal Symptoms:  (n/a)  CIWA: CIWA-Ar BP: 117/63 Pulse Rate: 99 COWS:    Allergies:  Allergies  Allergen Reactions  . Aspirin Swelling  . Penicillins Swelling    Home Medications:  (Not in a hospital admission)  OB/GYN Status:  No LMP recorded. Patient has had a hysterectomy.  General Assessment Data Location of Assessment: [facility] ED TTS Assessment: In system Is this a Tele or Face-to-Face Assessment?: Face-to-Face Is this an Initial Assessment or a Re-assessment for this encounter?: Initial  Assessment Marital status: Married [place] name: n/a Is patient pregnant?: No Pregnancy Status: No Living Arrangements: Spouse/significant other, Children Can pt return to current living arrangement?: Yes Admission Status: Voluntary Is patient capable of signing voluntary admission?: Yes Referral Source: Self/Family/Friend Insurance type: [facility] Medicare  Medical Screening Exam ([place]) Medical Exam completed: Yes  Crisis Care Plan Living Arrangements: Spouse/significant other, Children Legal Guardian: Other: (Self) Name of Psychiatrist: Reports of none Name of Therapist: Reports of none  Education Status Is patient currently in school?:  No Current Grade: n/a Highest grade of school patient has completed: n/a Name of school: n/a Contact person: n/a  Risk to self with the past 6 months Suicidal Ideation: No Has patient been a risk to self within the past 6 months prior to admission? : No Suicidal Intent: No Has patient had any suicidal intent within the past 6 months prior to admission? : No Is patient at risk for suicide?: No Suicidal Plan?: No Has patient had any suicidal plan within the past 6 months prior to admission? : No Access to Means: No What has been your use of drugs/alcohol within the last 12 months?: Reports of none Previous Attempts/Gestures: Yes How many times?: 1 Other Self Harm Risks: Reports of none Triggers for Past Attempts: None known Intentional Self Injurious Behavior: None Family Suicide History: No Recent stressful life event(s): Other (Comment) Persecutory voices/beliefs?: No Depression: Yes Depression Symptoms: Tearfulness, Feeling worthless/self pity, Loss of interest in usual pleasures Substance abuse history and/or treatment for substance abuse?: No Suicide prevention information given to non-admitted patients: Not applicable  Risk to Others within the past 6 months Homicidal Ideation: No Does patient have any lifetime risk of violence toward others beyond the six months prior to admission? : No Thoughts of Harm to Others: No Current Homicidal Intent: No Current Homicidal Plan: No Access to Homicidal Means: No Identified Victim: Reports of none History of harm to others?: No Assessment of Violence: None Noted Violent Behavior Description: Reports of none Does patient have access to weapons?: No Criminal Charges Pending?: No Does patient have a court date: No Is patient on probation?: No  Psychosis Hallucinations: None noted Delusions: None noted  Mental Status Report Appearance/Hygiene: Unremarkable, In scrubs Eye Contact: Fair Motor Activity: Freedom of movement,  Unremarkable Speech: Soft, Unremarkable Level of Consciousness: Alert Mood: Anxious, Sad, Pleasant, Suspicious Affect: Appropriate to circumstance, Sad, Depressed Anxiety Level: Minimal Thought Processes: Flight of Ideas, Thought Blocking Judgement: Impaired Orientation: Person, Place, Time, Situation, Appropriate for developmental age Obsessive Compulsive Thoughts/Behaviors: Minimal  Cognitive Functioning Concentration: Normal Memory: Recent Intact, Remote Intact IQ: Average Insight: Fair Impulse Control: Fair Appetite: Fair Weight Loss: 0 Weight Gain: 0 Sleep: Decreased Total Hours of Sleep: 6 Vegetative Symptoms: None  ADLScreening [facility]) Patient's cognitive ability adequate to safely complete daily activities?: Yes Patient able to express need for assistance with ADLs?: Yes Independently performs ADLs?: Yes (appropriate for developmental age)  Prior Inpatient Therapy Prior Inpatient Therapy: Yes Prior Therapy Dates: 11/2013 Prior Therapy Facilty/Provider(s): [facility] Reason for Treatment: Bipolar  Prior Outpatient Therapy Prior Outpatient Therapy: No Prior Therapy Dates: Reports of none Prior Therapy Facilty/Provider(s): Reports of none Reason for Treatment: Reports of none Does patient have an ACCT team?: No Does patient have Intensive In-House Services?  : No Does patient have [facility] services? : No Does patient have P4CC services?: No  ADL Screening (condition at time of admission) Patient's cognitive ability adequate to safely complete daily activities?: Yes Is the  patient deaf or have difficulty hearing?: No Does the patient have difficulty seeing, even when wearing glasses/contacts?: No Does the patient have difficulty concentrating, remembering, or making decisions?: No Patient able to express need for assistance with ADLs?: Yes Does the patient have difficulty dressing or bathing?: No Independently performs ADLs?: Yes (appropriate for  developmental age) Does the patient have difficulty walking or climbing stairs?: No Weakness of Legs: None Weakness of Arms/Hands: None  Home Assistive Devices/Equipment Home Assistive Devices/Equipment: None  Therapy Consults (therapy consults require a physician order) PT Evaluation Needed: No OT Evalulation Needed: No SLP Evaluation Needed: No Abuse/Neglect Assessment (Assessment to be complete while patient is alone) Physical Abuse: Denies Verbal Abuse: Denies Sexual Abuse: Denies Exploitation of patient/patient's resources: Denies Self-Neglect: Denies Values / Beliefs Cultural Requests During Hospitalization: None Spiritual Requests During Hospitalization: None Consults Spiritual Care Consult Needed: No Social Work Consult Needed: No Regulatory affairs officer (For Healthcare) Does Patient Have a Medical Advance Directive?: No    Additional Information 1:1 In Past 12 Months?: No CIRT Risk: No Elopement Risk: No Does patient have medical clearance?: Yes  Child/Adolescent Assessment Running Away Risk: Denies (Patient is an adult)  Disposition:  Disposition Initial Assessment Completed for this Encounter: Yes Disposition of Patient: Other dispositions (ER MD ordered Psych Consult)  On Site Evaluation by:   Reviewed with Physician:    Gunnar Fusi MS, LCAS, LPC, [place], CCSI Therapeutic Triage Specialist 10/27/2016 4:45 PM

## 2016-10-27 NOTE — ED Notes (Signed)
Pt told RN she is feeling much better now. Pt is laying in bed. Will continue to monitor her closely. Maintained on 15 minute checks and observation by security camera for safety.

## 2016-10-27 NOTE — ED Notes (Signed)
All belongings given to [place], ex husband per pt request

## 2016-10-27 NOTE — ED Notes (Signed)
Pt woke up and began screaming, lunged out of bed onto the chair and then began running all through the unit. Security officer able to deescalate patient and have her sit on her bed. Psychiatrist paged and mediations administered as ordered. Pt currently calm and speaking to her husband on the phone. Maintained on 15 minute checks and observation by security camera for safety.

## 2016-10-27 NOTE — ED Notes (Signed)
Family member reports that the pt saw her PCP yesterday and started on a medicine but the medicine did not work  PCP told them to come here today  "Yall better do something - its your fault." per ex husband

## 2016-10-27 NOTE — ED Notes (Signed)
Pt remains calm in her room. Maintained on 15 minute checks and observation by security camera for safety.

## 2016-10-27 NOTE — ED Notes (Signed)
Patient assigned to appropriate care area. Patient oriented to unit/care area: Informed that, for their safety, care areas are designed for safety and monitored by security cameras at all times; and visiting hours explained to patient. Patient verbalizes understanding, and verbal contract for safety obtained. 

## 2016-10-27 NOTE — ED Notes (Signed)
Patient asleep in room. No noted distress or abnormal behavior. Will continue 15 minute checks and observation by security cameras for safety. 

## 2016-10-27 NOTE — ED Notes (Signed)
Patient is voluntary and is pending inpatient admission. 

## 2016-10-27 NOTE — Consult Note (Signed)
  Psychiatry: Follow-up note. Patient is currently in the behavioral health unit of the emergency room and appears to be very agitated. Yelling and screaming somewhat out of control. Orders placed for when necessary medicine. Case discussed with nursing.

## 2016-10-27 NOTE — ED Notes (Signed)
Husband is taking home pt's personal belongings.

## 2016-10-27 NOTE — ED Triage Notes (Signed)
Blood work done at PCP and they want to wait for MD before labs done since they just had some done.

## 2016-10-27 NOTE — ED Notes (Signed)
Seen by [place]  - pt to be admitted to [facility]

## 2016-10-27 NOTE — ED Notes (Signed)

## 2016-10-27 NOTE — ED Notes (Signed)
Patient in dayroom interacting appropriately with other patient.

## 2016-10-27 NOTE — ED Notes (Addendum)
Pt refused medications. Pt stated, " I usually take them at night, I'm too confused."  Maintained on 15 minute checks and observation by security camera for safety.

## 2016-10-27 NOTE — ED Triage Notes (Signed)
Pt here for psychiatric evaluation. Pt currently denies SI/HI.  Ex husband who lives with pt reports she has periods of delusions and he is afriad she will hurt herself.  Pt has not done so prior.  He is upset because she was seen before and released reporting she was confused but he thinks she needs medication.  Pt is hyperreligous at times. Oriented currently.  Family reports she has outbursts. Pt just keeps repeating "i am not myself".

## 2016-10-28 ENCOUNTER — Inpatient Hospital Stay
Admission: AD | Admit: 2016-10-28 | Discharge: 2016-11-06 | DRG: 885 | Disposition: A | Discharge status: Home / Self Care | Payer: Medicare Other | Source: Intra-hospital | Attending: Psychiatry | Admitting: Psychiatry

## 2016-10-28 DIAGNOSIS — E119 Type 2 diabetes mellitus without complications: Secondary | ICD-10-CM | POA: Diagnosis present

## 2016-10-28 DIAGNOSIS — F1220 Cannabis dependence, uncomplicated: Secondary | ICD-10-CM

## 2016-10-28 DIAGNOSIS — Z5181 Encounter for therapeutic drug level monitoring: Secondary | ICD-10-CM | POA: Diagnosis not present

## 2016-10-28 DIAGNOSIS — Z79899 Other long term (current) drug therapy: Secondary | ICD-10-CM | POA: Diagnosis not present

## 2016-10-28 DIAGNOSIS — M549 Dorsalgia, unspecified: Secondary | ICD-10-CM | POA: Diagnosis present

## 2016-10-28 DIAGNOSIS — E039 Hypothyroidism, unspecified: Secondary | ICD-10-CM | POA: Diagnosis present

## 2016-10-28 DIAGNOSIS — G894 Chronic pain syndrome: Secondary | ICD-10-CM | POA: Diagnosis present

## 2016-10-28 DIAGNOSIS — I10 Essential (primary) hypertension: Secondary | ICD-10-CM | POA: Diagnosis present

## 2016-10-28 DIAGNOSIS — F3160 Bipolar disorder, current episode mixed, unspecified: Secondary | ICD-10-CM | POA: Diagnosis present

## 2016-10-28 DIAGNOSIS — F4310 Post-traumatic stress disorder, unspecified: Secondary | ICD-10-CM | POA: Diagnosis present

## 2016-10-28 DIAGNOSIS — F1290 Cannabis use, unspecified, uncomplicated: Secondary | ICD-10-CM | POA: Diagnosis present

## 2016-10-28 DIAGNOSIS — F17210 Nicotine dependence, cigarettes, uncomplicated: Secondary | ICD-10-CM | POA: Diagnosis present

## 2016-10-28 DIAGNOSIS — Z6830 Body mass index (BMI) 30.0-30.9, adult: Secondary | ICD-10-CM

## 2016-10-28 DIAGNOSIS — E669 Obesity, unspecified: Secondary | ICD-10-CM | POA: Diagnosis present

## 2016-10-28 DIAGNOSIS — F419 Anxiety disorder, unspecified: Secondary | ICD-10-CM | POA: Diagnosis present

## 2016-10-28 DIAGNOSIS — G4700 Insomnia, unspecified: Secondary | ICD-10-CM | POA: Diagnosis present

## 2016-10-28 DIAGNOSIS — E559 Vitamin D deficiency, unspecified: Secondary | ICD-10-CM | POA: Diagnosis present

## 2016-10-28 DIAGNOSIS — M797 Fibromyalgia: Secondary | ICD-10-CM | POA: Diagnosis present

## 2016-10-28 DIAGNOSIS — Z720 Tobacco use: Secondary | ICD-10-CM | POA: Diagnosis present

## 2016-10-28 DIAGNOSIS — F609 Personality disorder, unspecified: Secondary | ICD-10-CM | POA: Diagnosis present

## 2016-10-28 DIAGNOSIS — E785 Hyperlipidemia, unspecified: Secondary | ICD-10-CM | POA: Diagnosis present

## 2016-10-28 MED ORDER — ALUM & MAG HYDROXIDE-SIMETH 200-200-20 MG/5ML PO SUSP: 30.0000 mL | ORAL | Status: DC | PRN

## 2016-10-28 MED ORDER — ACETAMINOPHEN 325 MG PO TABS
650.0000 mg | ORAL_TABLET | Freq: Four times a day (QID) | ORAL | Status: DC | PRN
Start: 2016-10-28 — End: 2016-11-06
  Administered 2016-11-04: 650 mg via ORAL
  Filled 2016-10-28 (×3): qty 2

## 2016-10-28 MED ORDER — MAGNESIUM HYDROXIDE 400 MG/5ML PO SUSP: 30.0000 mL | Freq: Every day | ORAL | Status: DC | PRN

## 2016-10-28 MED ORDER — QUETIAPINE FUMARATE 100 MG PO TABS
100.0000 mg | ORAL_TABLET | Freq: Every day | ORAL | Status: DC
  Administered 2016-10-28: 100 mg via ORAL
  Filled 2016-10-28: qty 1

## 2016-10-28 MED ORDER — LOSARTAN POTASSIUM 50 MG PO TABS
50.0000 mg | ORAL_TABLET | Freq: Every day | ORAL | Status: DC
  Administered 2016-10-29 – 2016-11-06 (×9): 50 mg via ORAL
  Filled 2016-10-28 (×9): qty 1

## 2016-10-28 MED ORDER — LORAZEPAM 1 MG PO TABS
1.0000 mg | ORAL_TABLET | ORAL | Status: DC | PRN
Start: 2016-10-28 — End: 2016-10-29

## 2016-10-28 MED ORDER — AMLODIPINE BESYLATE 5 MG PO TABS
10.0000 mg | ORAL_TABLET | Freq: Every day | ORAL | Status: DC
  Administered 2016-10-29 – 2016-11-06 (×9): 10 mg via ORAL
  Filled 2016-10-28 (×11): qty 2

## 2016-10-28 MED ORDER — ATORVASTATIN CALCIUM 20 MG PO TABS
20.0000 mg | ORAL_TABLET | Freq: Every day | ORAL | Status: DC
  Administered 2016-10-29 – 2016-11-05 (×8): 20 mg via ORAL
  Filled 2016-10-28 (×9): qty 1

## 2016-10-28 MED ORDER — HALOPERIDOL 0.5 MG PO TABS
2.0000 mg | ORAL_TABLET | Freq: Four times a day (QID) | ORAL | Status: DC | PRN
Start: 2016-10-28 — End: 2016-10-29

## 2016-10-28 MED ORDER — TOPIRAMATE 100 MG PO TABS
200.0000 mg | ORAL_TABLET | Freq: Every day | ORAL | Status: DC
  Administered 2016-10-28 – 2016-11-03 (×7): 200 mg via ORAL
  Filled 2016-10-28 (×7): qty 2

## 2016-10-28 NOTE — Progress Notes (Addendum)
Admission Note:  D:Patient reports she doesn't feel like herself  Auditory hallucination  But unable to explain what they sound like . Periods  When her mood is up and down  Denies any suicidal ideation to self or others . Useing pot on regular basics  Periods of outburst crying uncontrollably  Patient  Mother died last year.Medical History . Patient spoke of Fibromyalgia.  Pt appeared depressed  With  a flat affect.  Pt  denies SI / time.    confusion. Pt is redirectable and cooperative with assessment.      A: Pt admitted to unit per protocol, skin assessment and search done and no contraband found.  Pt  educated on therapeutic milieu rules. Pt was introduced to milieu by nursing staff.    R: Pt was receptive to education about the milieu .  15 min safety checks started. Probation officer offered support  Interpreter : [employer]

## 2016-10-28 NOTE — ED Notes (Signed)
Patient resting quietly in bed with eyes closed. Pt remains safe with 15 minute checks

## 2016-10-28 NOTE — ED Notes (Signed)
Dinner brought to patient 

## 2016-10-28 NOTE — Tx Team (Signed)
Initial Treatment Plan 10/28/2016 5:57 PM Alice Simmons Alice Simmons [id number]    PATIENT STRESSORS: Health problems Medication change or noncompliance Substance abuse   PATIENT STRENGTHS: Ability for insight Active sense of humor Capable of independent living Supportive family/friends   PATIENT IDENTIFIED PROBLEMS: Psychosis 10/28/16  Substance Abuse 10/28/16                   DISCHARGE CRITERIA:  Ability to meet basic life and health needs Adequate post-discharge living arrangements Improved stabilization in mood, thinking, and/or behavior Medical problems require only outpatient monitoring  PRELIMINARY DISCHARGE PLAN: Outpatient therapy Return to previous living arrangement  PATIENT/FAMILY INVOLVEMENT: This treatment plan has been presented to and reviewed with the patient, [name], and/or family member,  .  The patient and family have been given the opportunity to ask questions and make suggestions.  Leodis Liverpool, RN 10/28/2016, 5:57 PM

## 2016-10-28 NOTE — ED Notes (Addendum)
Pt at nursing station to bring trash. Walked back with pt to pt's room. Pt inquired about her 'bed'. Reminded pt that we are still waiting for bed availability. Pt appears somewhat labile- smiling one minute, then in tears. Pt reports that her thinking hasn't been right since having fibromyalgia. Pt denies pain at this time. Offered emotional support while sitting at bedside with patient..Pt given orange juice and put instrumental music on for pt. Pt resting quietly in bed . Pt remains safe with 15 minute checks.

## 2016-10-28 NOTE — Plan of Care (Signed)
Problem: Safety: Goal: Ability to redirect hostility and anger into socially appropriate behaviors will improve Outcome: Not Progressing Psychotic at present

## 2016-10-28 NOTE — ED Notes (Signed)
Lunch brought to patient 

## 2016-10-28 NOTE — BH Assessment (Signed)
Reassessment:  Pt denies SI/HI. Pt reports confusion, tearfulness and a tingling in her body. Pt's responses to inquires regarding psychosis inaudible. Per pt RN Mateo Flow) pt received haldol and Geodon due to anxiety. Pt has not been aggressive or violent.

## 2016-10-28 NOTE — ED Notes (Signed)
Patient on phone talking to husband

## 2016-10-28 NOTE — ED Provider Notes (Signed)
-----------------------------------------   1:29 PM on 10/28/2016 -----------------------------------------   Blood pressure 118/79, pulse 95, temperature 98.1 F (36.7 C), temperature source Oral, resp. rate 16, height 5\' 2"  (1.575 m), weight 121 lb (54.9 kg), SpO2 100 %.  The patient had no acute events since last update.  Calm and cooperative at this time.  Disposition is pending Psychiatry/Behavioral Medicine team recommendations.     Darel Hong, MD 10/28/16 1329

## 2016-10-28 NOTE — BH Assessment (Signed)
Pt placement referral resubmitted to [facility].

## 2016-10-28 NOTE — BH Assessment (Signed)
Charge RN [employer]) provided with copy of support papers originally placed on pt chart.

## 2016-10-28 NOTE — ED Notes (Addendum)
Patient crying and wandering  the unit. Pt compliant with medication. Staff sitting in dayroom with pt offering encouragement.

## 2016-10-28 NOTE — BH Assessment (Signed)
Pt provided to charge RN to review for possible [facility] placement.

## 2016-10-28 NOTE — BH Assessment (Signed)
Referral information for Geriatric Placement have been faxed to:  [facility] ([phone])  [facility] [phone])  [facility] [phone])  [facility] ([phone])  [facility] [phone])

## 2016-10-28 NOTE — ED Notes (Signed)
Patient transferred to lower level, BMU, in W/C accompanied by staff. Pt stable at this time. No belongings with pt

## 2016-10-28 NOTE — ED Notes (Signed)
PT  VOL/  PENDING  PLACEMENT 

## 2016-10-28 NOTE — ED Notes (Signed)
Pt informed of transfer and is eager to be moved

## 2016-10-28 NOTE — ED Notes (Signed)
Husband here to visit patient 

## 2016-10-28 NOTE — ED Notes (Signed)
Patient in dayroom talking to staff. Breakfast brought to pt. Husband called to inquire about pt's status. Husband, Clifton James, reports that pt has had paranoid delusions and depression since the passing of her mother a year ago.

## 2016-10-29 ENCOUNTER — Encounter: Payer: Self-pay | Admitting: Psychiatry

## 2016-10-29 DIAGNOSIS — F3160 Bipolar disorder, current episode mixed, unspecified: Principal | ICD-10-CM

## 2016-10-29 DIAGNOSIS — F1220 Cannabis dependence, uncomplicated: Secondary | ICD-10-CM

## 2016-10-29 LAB — LIPID PANEL
CHOL/HDL RATIO: 3.8 ratio
CHOLESTEROL: 182 mg/dL (ref 0–200)
HDL: 48 mg/dL (ref >40)
LDL Cholesterol: 106 mg/dL — ABNORMAL HIGH (ref 0–99)
Triglycerides: 142 mg/dL (ref <150)
VLDL: 28 mg/dL (ref 0–40)

## 2016-10-29 LAB — TSH: TSH: 10.889 u[IU]/mL — AB (ref 0.350–4.500)

## 2016-10-29 MED ORDER — BENZOCAINE 10 % MT GEL
Freq: Three times a day (TID) | OROMUCOSAL | Status: DC
  Administered 2016-10-29: 1 via OROMUCOSAL
  Administered 2016-10-29: 22:00:00 via OROMUCOSAL
  Administered 2016-10-30 (×3): 1 via OROMUCOSAL
  Administered 2016-10-30: 21:00:00 via OROMUCOSAL
  Administered 2016-10-31: 1 via OROMUCOSAL
  Administered 2016-10-31 – 2016-11-02 (×8): via OROMUCOSAL
  Filled 2016-10-29: qty 9.4

## 2016-10-29 MED ORDER — LORAZEPAM 0.5 MG PO TABS
0.5000 mg | ORAL_TABLET | Freq: Four times a day (QID) | ORAL | Status: DC | PRN
  Administered 2016-10-29: 0.5 mg via ORAL
  Filled 2016-10-29: qty 1

## 2016-10-29 MED ORDER — LORAZEPAM 1 MG PO TABS
1.0000 mg | ORAL_TABLET | Freq: Every day | ORAL | Status: DC
  Administered 2016-10-29 – 2016-10-30 (×2): 1 mg via ORAL
  Filled 2016-10-29 (×2): qty 1

## 2016-10-29 MED ORDER — NICOTINE 21 MG/24HR TD PT24
21.0000 mg | MEDICATED_PATCH | Freq: Every day | TRANSDERMAL | Status: DC
  Filled 2016-10-29 (×3): qty 1

## 2016-10-29 MED ORDER — OLANZAPINE 5 MG PO TBDP
5.0000 mg | ORAL_TABLET | Freq: Two times a day (BID) | ORAL | Status: DC
  Administered 2016-10-29 – 2016-10-30 (×3): 5 mg via ORAL
  Filled 2016-10-29 (×3): qty 1

## 2016-10-29 MED ORDER — MELOXICAM 7.5 MG PO TABS
7.5000 mg | ORAL_TABLET | Freq: Two times a day (BID) | ORAL | Status: DC
  Administered 2016-10-29 – 2016-11-06 (×16): 7.5 mg via ORAL
  Filled 2016-10-29 (×16): qty 1

## 2016-10-29 NOTE — BHH Suicide Risk Assessment (Addendum)
[facility] Admission Suicide Risk Assessment   Nursing information obtained from:    Demographic factors:    Current Mental Status:    Loss Factors:    Historical Factors:    Risk Reduction Factors:      Principal Problem: Bipolar disorder ([place]) Diagnosis:   Patient Active Problem List   Diagnosis Date Noted  . Cannabis use disorder, moderate, dependence ([place]) [F12.20] 10/29/2016  . Bipolar disorder ([place]) [F31.9] 10/29/2016  . Personality disorder, unspecified [F60.9] 10/27/2016  . Chronic pain syndrome [G89.4] 03/21/2015  . Diabetes mellitus type 2, controlled, without complications ([place]) [Y77.4] 11/28/2014  . Hyperlipidemia [E78.5] 11/28/2014  . Vitamin D deficiency [E55.9] 11/28/2014  . Tobacco abuse [Z72.0] 05/01/2013   Subjective Data:   Continued Clinical Symptoms:  Alcohol Use Disorder Identification Test Final Score (AUDIT): 0 The "Alcohol Use Disorders Identification Test", Guidelines for Use in Primary Care, Second Edition.  World Pharmacologist [facility]). Score between 0-7:  no or low risk or alcohol related problems. Score between 8-15:  moderate risk of alcohol related problems. Score between 16-19:  high risk of alcohol related problems. Score 20 or above:  warrants further diagnostic evaluation for alcohol dependence and treatment.   CLINICAL FACTORS:   Severe Anxiety and/or Agitation Alcohol/Substance Abuse/Dependencies Personality Disorders:   Cluster B Currently Psychotic Previous Psychiatric Diagnoses and Treatments   Psychiatric Specialty Exam: Physical Exam  ROS  Blood pressure (!) 133/94, pulse (!) 105, temperature 98 F (36.7 C), resp. rate 18, height 5\' 1"  (1.549 m), weight 54.9 kg (121 lb), SpO2 100 %.Body mass index is 22.86 kg/m.                                                    Sleep:  Number of Hours: 8      COGNITIVE FEATURES THAT CONTRIBUTE TO RISK:  Loss of executive function    SUICIDE RISK:   Moderate:   Frequent suicidal ideation with limited intensity, and duration, some specificity in terms of plans, no associated intent, good self-control, limited dysphoria/symptomatology, some risk factors present, and identifiable protective factors, including available and accessible social support.  PLAN OF CARE: admit  I certify that inpatient services furnished can reasonably be expected to improve the patient's condition.   [name], MD 10/29/2016, 2:55 PM

## 2016-10-29 NOTE — Progress Notes (Signed)
Patient ID: Alice Simmons, female   DOB: Feb 03, 1956, 61 y.o.   MRN: [id number] Quiet, walking around the milieu, observed wrapped in blanket "I am cold ..." Limited English but able to express needs, ask relevant questions and verbalized understanding of care; patient stays mostly to self, PMHx reviewed, asymptomatic of chronic pain syndrome; complied with bedtime medications, will monitor.

## 2016-10-29 NOTE — BHH Group Notes (Signed)
[place] Group Notes:  (Nursing/MHT/Case Management/Adjunct)  Date:  10/29/2016  Time:  4:00 PM  Type of Therapy:  Psychoeducational Skills  Participation Level:  Active  Participation Quality:  Appropriate, Attentive, Sharing and Supportive  Affect:  Appropriate  Cognitive:  Alert and Appropriate  Insight:  Appropriate and Good  Engagement in Group:  Engaged and Supportive  Modes of Intervention:  Discussion, Education, Exploration and Support  Summary of Progress/Problems:  Alice Simmons [facility] 10/29/2016, 4:00 PM

## 2016-10-29 NOTE — Progress Notes (Signed)
Recreation Therapy Notes  Date: 05.17.18 Time: 9:30 am Location: Craft Room  Group Topic: Leisure Education  Goal Area(s) Addresses:  Patient will identify things they are grateful for. Patient will identify how being grateful can influence decision making.  Behavioral Response: Attentive  Intervention: Immunologist  Activity: Patients were given an I Am Grateful For worksheet and were instructed to write things they were grateful for under each category.   Education: LRT educated patient on leisure.  Education Outcome: In group clarification offered  Clinical Observations/Feedback: Patient colored coloring sheet. Patient did not contribute to group discussion.  Leonette Monarch, LRT/CTRS 10/29/2016 10:06 AM

## 2016-10-29 NOTE — BHH Group Notes (Signed)
[place] Group Notes:  (Nursing/MHT/Case Management/Adjunct)  Date:  10/29/2016  Time:  5:27 AM  Type of Therapy:  Group Therapy  Participation Level:  Did Not Attend   Summary of Progress/Problems:  Alice Simmons 10/29/2016, 5:27 AM

## 2016-10-29 NOTE — Plan of Care (Signed)
Problem: Coping: Goal: Ability to cope will improve Outcome: Not Progressing Periods of tearfulness. Unable to get past this

## 2016-10-29 NOTE — BHH Group Notes (Signed)
[facility] LCSW Group Therapy   10/29/2016 1pm   Type of Therapy: Group Therapy   Participation Level: Minimal   Participation Quality: Attentive, Sharing and Supportive   Affect: Appropriate   Cognitive: Alert and Oriented   Insight: Developing/Improving and Engaged   Engagement in Therapy: Developing/Improving and Engaged   Modes of Intervention: Clarification, Confrontation, Discussion, Education, Exploration, Limit-setting, Orientation, Problem-solving, Rapport Building, Art therapist, Socialization and Support   Summary of Progress/Problems: The topic for group was balance in life. Today's group focused on defining balance in one's own words, identifying things that can knock one off balance, and exploring healthy ways to maintain balance in life. Group members were asked to provide an example of a time when they felt off balance, describe how they handled that situation, and process healthier ways to regain balance in the future. Group members were asked to share the most important tool for maintaining balance that they learned while at [facility] and how they plan to apply this method after discharge.   Glorious Peach, MSW, LCSWA 10/29/2016, 2:10PM
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
## 2016-10-29 NOTE — Plan of Care (Signed)
Problem: Activity: Goal: Sleeping patterns will improve Outcome: Progressing Patient slept for Estimated Hours of 8; every 15 minutes safety round maintained, no injury or falls during this shift.    

## 2016-10-29 NOTE — Progress Notes (Signed)
D:Mood swing during shift . Periods of yelling aloud and crying , To saying nothing . Patient stated she wanted to go home. Voice she is not suicidal " I want to live my life  I want to enjoy it "  appetites poor No. ADL's preformed this shift . Pacing halls . Voice she was not  Able to attend  Group . Unable to . Feels tired .  Question if sh e was hearing voices this am  .  No auditory hallucinations  No pain concerns . Appropriate ADL'S. Interacting with peers and staff.  A: Encourage patient participation with unit programming . Instruction  Given on  Medication , verbalize understanding. R: Voice no other concerns. Staff continue to monitor

## 2016-10-30 LAB — HEMOGLOBIN A1C
HEMOGLOBIN A1C: 5.6 % (ref 4.8–5.6)
MEAN PLASMA GLUCOSE: 114 mg/dL

## 2016-10-30 LAB — VITAMIN B12: Vitamin B-12: 891 pg/mL (ref 180–914)

## 2016-10-30 LAB — RAPID HIV SCREEN (HIV 1/2 AB+AG)
HIV 1/2 Antibodies: NONREACTIVE
HIV-1 P24 Antigen - HIV24: NONREACTIVE

## 2016-10-30 LAB — AMMONIA: Ammonia: 12 umol/L (ref 9–35)

## 2016-10-30 LAB — PROLACTIN: Prolactin: 11.9 ng/mL (ref 4.8–23.3)

## 2016-10-30 MED ORDER — LORAZEPAM 0.5 MG PO TABS
0.5000 mg | ORAL_TABLET | Freq: Two times a day (BID) | ORAL | Status: DC
  Administered 2016-10-30 – 2016-11-05 (×12): 0.5 mg via ORAL
  Filled 2016-10-30 (×12): qty 1

## 2016-10-30 MED ORDER — OLANZAPINE 5 MG PO TBDP
5.0000 mg | ORAL_TABLET | Freq: Three times a day (TID) | ORAL | Status: DC
  Administered 2016-10-30 – 2016-11-01 (×6): 5 mg via ORAL
  Filled 2016-10-30 (×6): qty 1

## 2016-10-30 NOTE — Plan of Care (Signed)
Problem: Activity: Goal: Sleeping patterns will improve Outcome: Progressing Patient slept for Estimated Hours of 5.45; every 15 minutes safety round maintained, no injury or falls during this shift.

## 2016-10-30 NOTE — Tx Team (Signed)
Interdisciplinary Treatment and Diagnostic Plan Update 10/30/16 Time of Session: [place] MRN: [id number]  Principal Diagnosis: Bipolar I disorder, most recent episode mixed ([place])  Secondary Diagnoses: Principal Problem:   Bipolar I disorder, most recent episode mixed ([place]) Active Problems:   Tobacco abuse   Diabetes mellitus type 2, controlled, without complications ([place])   Hyperlipidemia   Vitamin D deficiency   Chronic pain syndrome   Cannabis use disorder, moderate, dependence ([facility])   Current Medications:  Current Facility-Administered Medications  Medication Dose Route Frequency Provider Last Rate Last Dose  . acetaminophen (TYLENOL) tablet 650 mg  650 mg Oral Q6H PRN Clapacs, [name], MD      . alum & mag hydroxide-simeth (MAALOX/MYLANTA) 200-200-20 MG/5ML suspension 30 mL  30 mL Oral Q4H PRN Clapacs, [name], MD      . amLODipine (NORVASC) tablet 10 mg  10 mg Oral Daily Clapacs, [name], MD   10 mg at 10/30/16 0755  . atorvastatin (LIPITOR) tablet 20 mg  20 mg Oral q1800 Clapacs, [name], MD   20 mg at 10/29/16 1716  . benzocaine (ORAJEL) 10 % mucosal gel   Mouth/Throat TID AC & HS [name], MD   1 application at 42/59/56 0759  . LORazepam (ATIVAN) tablet 0.5 mg  0.5 mg Oral BID [name], MD      . LORazepam (ATIVAN) tablet 1 mg  1 mg Oral QHS [name], MD   1 mg at 10/29/16 2113  . losartan (COZAAR) tablet 50 mg  50 mg Oral Daily Clapacs, [name], MD   50 mg at 10/30/16 0755  . magnesium hydroxide (MILK OF MAGNESIA) suspension 30 mL  30 mL Oral Daily PRN Clapacs, [name], MD      . meloxicam (MOBIC) tablet 7.5 mg  7.5 mg Oral BID [name], MD   7.5 mg at 10/30/16 0755  . nicotine (NICODERM CQ - dosed in mg/24 hours) patch 21 mg  21 mg Transdermal Daily [name], MD      . OLANZapine zydis (ZYPREXA) disintegrating tablet 5 mg  5 mg Oral TID [name], MD       . topiramate (TOPAMAX) tablet 200 mg  200 mg Oral QHS Clapacs, [name], MD   200 mg at 10/29/16 2113   PTA Medications: Prescriptions Prior to Admission  Medication Sig Dispense Refill Last Dose  . amLODipine (NORVASC) 10 MG tablet TAKE 1 TABLET(10 MG) BY MOUTH DAILY 90 tablet 2 UNKNOWN at UNKNOWN  . atorvastatin (LIPITOR) 20 MG tablet TAKE 1 TABLET(20 MG) BY MOUTH AT BEDTIME 90 tablet 0 UNKNOWN at UNKNOWN  . benzonatate (TESSALON PERLES) 100 MG capsule Take 1 capsule (100 mg total) by mouth every 8 (eight) hours as needed for cough. 30 capsule 0 PRN at PRN  . DULoxetine (CYMBALTA) 30 MG capsule Take 1 capsule (30 mg total) by mouth daily. For 1 week, then two a day; stop paxil 53 capsule 0 UNKNOWN at UNKNOWN  . gabapentin (NEURONTIN) 600 MG tablet TAKE 1 TABLET BY MOUTH THREE TIMES DAILY 90 tablet 3 UNKNOWN at UNKNOWN  . losartan (COZAAR) 50 MG tablet TAKE 1 TABLET BY MOUTH DAILY 30 tablet 5 UNKNOWN at UNKNOWN  . potassium chloride (K-DUR) 10 MEQ tablet Take 1 tablet (10 mEq total) by mouth daily. 5 tablet 0 UNKNOWN at UNKNOWN  . topiramate (TOPAMAX) 100 MG tablet TAKE 2 TABLETS(200 MG) BY MOUTH AT BEDTIME 180 tablet 1 UNKNOWN at UNKNOWN  . traZODone (DESYREL)  50 MG tablet TAKE 1/2 TO 1 TABLET BY MOUTH AT BEDTIME AS NEEDED FOR SLEEP 90 tablet 1 UNKNOWN at UNKNOWN    Patient Stressors: Health problems Medication change or noncompliance Substance abuse  Patient Strengths: Ability for insight Active sense of humor Capable of independent living Supportive family/friends  Treatment Modalities: Medication Management, Group therapy, Case management,  1 to 1 session with clinician, Psychoeducation, Recreational therapy.   Physician Treatment Plan for Primary Diagnosis: Bipolar I disorder, most recent episode mixed ([place]) Long Term Goal(s): Improvement in symptoms so as ready for discharge Improvement in symptoms so as ready for discharge   Short Term Goals: Ability to verbalize feelings  will improve Ability to demonstrate self-control will improve Compliance with prescribed medications will improve Ability to identify and develop effective coping behaviors will improve Ability to identify triggers associated with substance abuse/mental health issues will improve  Medication Management: Evaluate patient's response, side effects, and tolerance of medication regimen.  Therapeutic Interventions: 1 to 1 sessions, Unit Group sessions and Medication administration.  Evaluation of Outcomes: Not Met  Physician Treatment Plan for Secondary Diagnosis: Principal Problem:   Bipolar I disorder, most recent episode mixed ([place]) Active Problems:   Tobacco abuse   Diabetes mellitus type 2, controlled, without complications ([place])   Hyperlipidemia   Vitamin D deficiency   Chronic pain syndrome   Cannabis use disorder, moderate, dependence ([place])  Long Term Goal(s): Improvement in symptoms so as ready for discharge Improvement in symptoms so as ready for discharge   Short Term Goals: Ability to verbalize feelings will improve Ability to demonstrate self-control will improve Compliance with prescribed medications will improve Ability to identify and develop effective coping behaviors will improve Ability to identify triggers associated with substance abuse/mental health issues will improve     Medication Management: Evaluate patient's response, side effects, and tolerance of medication regimen.  Therapeutic Interventions: 1 to 1 sessions, Unit Group sessions and Medication administration.  Evaluation of Outcomes: Not Met   RN Treatment Plan for Primary Diagnosis: Bipolar I disorder, most recent episode mixed ([place]) Long Term Goal(s): Knowledge of disease and therapeutic regimen to maintain health will improve  Short Term Goals: Ability to verbalize feelings will improve, Ability to identify and develop effective coping behaviors will improve and Compliance with prescribed  medications will improve  Medication Management: RN will administer medications as ordered by provider, will assess and evaluate patient's response and provide education to patient for prescribed medication. RN will report any adverse and/or side effects to prescribing provider.  Therapeutic Interventions: 1 on 1 counseling sessions, Psychoeducation, Medication administration, Evaluate responses to treatment, Monitor vital signs and CBGs as ordered, Perform/monitor CIWA, COWS, AIMS and Fall Risk screenings as ordered, Perform wound care treatments as ordered.  Evaluation of Outcomes: Not Met   LCSW Treatment Plan for Primary Diagnosis: Bipolar I disorder, most recent episode mixed ([place]) Long Term Goal(s): Safe transition to appropriate next level of care at discharge, Engage patient in therapeutic group addressing interpersonal concerns.  Short Term Goals: Engage patient in aftercare planning with referrals and resources and Increase social support  Therapeutic Interventions: Assess for all discharge needs, 1 to 1 time with Social worker, Explore available resources and support systems, Assess for adequacy in community support network, Educate family and significant other(s) on suicide prevention, Complete Psychosocial Assessment, Interpersonal group therapy.  Evaluation of Outcomes: Not Met   Progress in Treatment: Attending groups: Yes. Participating in groups: Yes. Taking medication as prescribed: Yes. Toleration medication: Yes. Family/Significant  other contact made: No, will contact:  when given permission Patient understands diagnosis: No. Discussing patient identified problems/goals with staff: Yes. Medical problems stabilized or resolved: Yes. Denies suicidal/homicidal ideation: Yes. Issues/concerns per patient self-inventory: No. Other: none   New problem(s) identified: No, Describe:  none  New Short Term/Long Term Goal(s):   Discharge Plan or Barriers: CSW assessing for  appropriate plan. Reason for Continuation of Hospitalization: Medication stabilization  Estimated Length of Stay: 7 days  Attendees: Patient: Pt does not have capacity to attend at this time. 10/30/2016   Physician: Dr. Jerilee Hoh, MD 10/30/2016   Nursing: Polly Cobia, RN 10/30/2016   RN Care Manager: 10/30/2016  Social Worker: Lurline Idol, LCSW 10/30/2016   Recreational Therapist: Everitt Amber, LRT/CTRS  10/30/2016   Other:  10/30/2016   Other:  10/30/2016   Other: 10/30/2016     Scribe for Treatment Team: Joanne Chars, [place] 10/30/2016 11:37 AM

## 2016-10-30 NOTE — BHH Group Notes (Signed)
[place] LCSW Group Therapy  10/30/2016 2:29 PM  Type of Therapy:  Group Therapy  Participation Level:  Patient did not attend group. CSW invited patient to group.   Summary of Progress/Problems: Stress management: Patients defined and discussed the topic of stress and the related symptoms and triggers for stress. Patients identified healthy coping skills they would like to try during hospitalization and after discharge to manage stress in a healthy way. CSW offered insight to varying stress management techniques.   Padraic Marinos G. [place], [place] 10/30/2016, 2:30 PM

## 2016-10-30 NOTE — Progress Notes (Signed)
D:Mood swing during shift .continue to have periods of yelling aloud and crying ,  appetites poor No. ADL's preformed this shift . Pacing halls . Voice she was not  Able to attend  Group .   No pain concerns . Patient continue to voice of wanting to go home called husband  To pick her up  Received a call from husband . Informed she would not be leaving today. energy level  Is normal. Stated concentration poor . Denies suicidal  homicidal ideations  .  No pain concerns . Limited Interacting with peers and staff.  A: Encourage patient participation with unit programming . Instruction  Given on  Medication , verbalize understanding. R: Voice no other concerns. Staff continue to monitor

## 2016-10-30 NOTE — Progress Notes (Signed)
Recreation Therapy Notes  Date: 05.18.18 Time: 9:30 am Location: Craft [facility]  Group Topic: Coping Skills  Goal Area(s) Addresses:  Patient will identify healthy coping skills. Patient will verbalize benefit of using healthy coping skills.  Behavioral Response: Attentive   Intervention: Coping Skills Alphabet  Activity: Patients were given a Theme park manager worksheet and were instructed to write healthy coping skills for each letter of the alphabet.  Education: LRT educated patients on healthy coping skills.  Education Outcome: In group clarification offered   Clinical Observations/Feedback: Patient told LRT things she liked to do and LRT wrote on patient's worksheet. Patient did not contribute to group discussion.  Leonette Monarch, LRT/CTRS 10/30/2016 10:11 AM

## 2016-10-30 NOTE — BHH Suicide Risk Assessment (Signed)
[place] INPATIENT:  Family/Significant Other Suicide Prevention Education  Suicide Prevention Education:  Contact Attempts: Sid Falcon, husband, [phone], has been identified by the patient as the family member/significant other with whom the patient will be residing, and identified as the person(s) who will aid the patient in the event of a mental health crisis.  With written consent from the patient, two attempts were made to provide suicide prevention education, prior to and/or following the patient's discharge.  We were unsuccessful in providing suicide prevention education.  A suicide education pamphlet was given to the patient to share with family/significant other.  Date and time of first attempt:10/30/16, 1415 Date and time of second attempt:  Joanne Chars, LCSW 10/30/2016, 2:17 PM
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
## 2016-10-30 NOTE — Plan of Care (Signed)
Problem: Coping: Goal: Ability to cope will improve Outcome: Not Progressing No insight , unable to comprehend

## 2016-10-30 NOTE — Progress Notes (Signed)
Recreation Therapy Notes  According to patient's doctor, patient was acting fine then getting irritable and angry. Patient's nurse stated pt was having mood swings. LRT will attempt assessment later.  Leonette Monarch, LRT/CTRS 10/30/2016 3:37 PM
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
## 2016-10-30 NOTE — Progress Notes (Signed)
Patient ID: Alice Simmons, female   DOB: 12-Jan-1956, 61 y.o.   MRN: [id number] Visitation by husband went well according to the patient; mood and affect getting much better, interacting well with peers (limit set about holding hands/touching), denied SI/HI, denied AV/H, medication compliant.

## 2016-10-31 DIAGNOSIS — F1220 Cannabis dependence, uncomplicated: Secondary | ICD-10-CM

## 2016-10-31 LAB — THYROID PANEL WITH TSH
Free Thyroxine Index: 3.3 (ref 1.2–4.9)
T3 UPTAKE RATIO: 33 % (ref 24–39)
T4, Total: 10.1 ug/dL (ref 4.5–12.0)
TSH: 4.42 u[IU]/mL (ref 0.450–4.500)

## 2016-10-31 LAB — RPR: RPR: NONREACTIVE

## 2016-10-31 MED ORDER — TRAZODONE HCL 100 MG PO TABS
100.0000 mg | ORAL_TABLET | Freq: Every day | ORAL | Status: DC
  Administered 2016-10-31 – 2016-11-01 (×2): 100 mg via ORAL
  Filled 2016-10-31 (×2): qty 1

## 2016-10-31 NOTE — BHH Group Notes (Signed)
[place] LCSW Group Therapy  10/31/2016 2:23 PM  Type of Therapy:  Group Therapy  Participation Level:  Minimal  Participation Quality:  Attentive  Affect:  Appropriate  Cognitive:  Alert  Insight:  Limited  Engagement in Therapy:  Limited  Modes of Intervention:  Activity, Clarification, Discussion, Education, Exploration, Limit-setting, Problem-solving, Reality Testing, Socialization and Support  Summary of Progress/Problems: Coping Skills: Patients defined and discussed healthy coping skills. Patients identified healthy coping skills they would like to try during hospitalization and after discharge. CSW offered insight to varying coping skills that may have been new to patients such as practicing mindfulness.  Iyesha Such G. [place], [place] 10/31/2016, 2:24 PM

## 2016-10-31 NOTE — Progress Notes (Signed)
Patient was bright on approach, she is somewhat intrusive and requires some redirection.  At the beginning of the shift she had covered her window with paper towels because she believed people were looking at here in her room. She was up and down to looking at the clock during the night. She was medication compliant, conversation seemed to be logical and coherent.

## 2016-10-31 NOTE — Progress Notes (Addendum)
Pt denies SI/HI/AVH. Pleasant during interaction. Medication compliant. Voices no concerns at this time. Safety maintained. Will continue to monitor.  Pt up pacing unit at various times tonight.
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
## 2016-10-31 NOTE — Plan of Care (Signed)
Problem: Education: Goal: Will be free of psychotic symptoms Outcome: Progressing Patient logical and coherent, no psychosis noted.

## 2016-10-31 NOTE — Progress Notes (Signed)
Patient pleasant and cooperative. Med and group compliant. Appropriate with staff and peers. Denies SI, HI, AVH. Pt reports "I am feeling just fine" Pt eating meals well. Pt became tearful on a phone call, would not elaborate on why she was crying.  Encouragement and support offered. Safety checks maintained. Medications given as prescribed. Pt receptive and remains safe on unit with q 15 min checks.

## 2016-11-01 MED ORDER — OLANZAPINE 5 MG PO TBDP
10.0000 mg | ORAL_TABLET | Freq: Every day | ORAL | Status: DC
  Administered 2016-11-01 – 2016-11-02 (×2): 10 mg via ORAL
  Filled 2016-11-01 (×2): qty 2

## 2016-11-01 MED ORDER — OLANZAPINE 5 MG PO TBDP: 5.0000 mg | ORAL_TABLET | Freq: Two times a day (BID) | ORAL | Status: DC

## 2016-11-01 MED ORDER — OLANZAPINE 5 MG PO TBDP
5.0000 mg | ORAL_TABLET | Freq: Two times a day (BID) | ORAL | Status: DC
  Administered 2016-11-01 – 2016-11-03 (×4): 5 mg via ORAL
  Filled 2016-11-01 (×4): qty 1

## 2016-11-01 NOTE — [facility] Group Notes (Signed)
[facility] Group Notes:  (Nursing/MHT/Case Management/Adjunct)  Date:  11/01/2016  Time:  5:47 AM  Type of Therapy:  Psychoeducational Skills  Participation Level:  Minimal  Participation Quality:  Appropriate  Affect:  Appropriate  Cognitive:  Appropriate  Insight:  Limited  Engagement in Group:  Limited  Modes of Intervention:  Discussion, Socialization and Support  Summary of Progress/Problems:  Alice Simmons 11/01/2016, 5:47 AM

## 2016-11-01 NOTE — Plan of Care (Signed)
Problem: Safety: Goal: Ability to remain free from injury will improve Outcome: Progressing Pt safe on the unit at this time   

## 2016-11-01 NOTE — Progress Notes (Signed)
Patient pleasant and cooperative with care. Noted in room talking to self. Denies SI, HI, AVH. Med and group compliant. Complains of back pain, prn offered, then patient refused. Eating and drinking well. Encouragement and support offered. Safety checks maintained. Medications given as prescribed. Pt receptive and remains safe on unit with q 15 min checks.

## 2016-11-01 NOTE — Plan of Care (Signed)
Problem: Pain Managment: Goal: General experience of comfort will improve Outcome: Not Progressing Patient continues to complain of pain to back. Requested tylenol then refused

## 2016-11-01 NOTE — [facility] Group Notes (Signed)
[place] LCSW Group Therapy  11/01/2016 2:49 PM  Type of Therapy:  Group Therapy  Participation Level:  Minimal  Participation Quality:  Attentive  Affect:  Appropriate  Cognitive:  Alert  Insight:  Limited and Off Topic  Engagement in Therapy:  Limited  Modes of Intervention:  Clarification, Discussion, Education, Limit-setting, Reality Testing, Socialization and Support  Summary of Progress/Problems: Stages of Grief: Group facilitator defined grief and the stages of the grieving process. Facilitator offered support to patients and allowed patients to share openly about their experiences with grief. Patients explored how their individual experiences with grief have impacted their wellness and recovery. Each patient was challenged to recognize their obstacles in finding acceptance after experiencing a loss. Facilitator processed with patients new coping skills to overcome their challenges with grief. Group members received and gave support to group members.   Alice Simmons G. Purdy, [name] 11/01/2016, 3:23 PM
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
## 2016-11-01 NOTE — Progress Notes (Signed)
D: Pt denies SI/HI/AVH. Pt is pleasant and cooperative. Pt walks on the unit appearing paranoid. Pt keeps to herself, but will interact with some peers at times.  A: Pt was offered support and encouragement. Pt was given scheduled medications. Pt was encourage to attend groups. Q 15 minute checks were done for safety.   R:Pt attends groups and interacts well with peers and staff. Pt is taking medication. Pt has no complaints at this time.Pt receptive to treatment and safety maintained on unit.

## 2016-11-02 MED ORDER — LORAZEPAM 1 MG PO TABS
1.0000 mg | ORAL_TABLET | Freq: Every day | ORAL | Status: DC
  Administered 2016-11-02: 1 mg via ORAL
  Filled 2016-11-02: qty 1

## 2016-11-02 NOTE — [facility] Group Notes (Signed)
[facility] Group Notes:  (Nursing/MHT/Case Management/Adjunct)  Date:  11/02/2016  Time:  5:27 PM  Type of Therapy:  Psychoeducational Skills  Participation Level:  Active  Participation Quality:  Appropriate, Attentive, Sharing and Supportive  Affect:  Appropriate  Cognitive:  Alert and Appropriate  Insight:  Limited  Engagement in Group:  Engaged and Supportive  Modes of Intervention:  Discussion, Education, Exploration and Support  Summary of Progress/Problems:  Alice Simmons 11/02/2016, 5:27 PM

## 2016-11-02 NOTE — [facility] Group Notes (Signed)
[facility] Group Notes:  (Nursing/MHT/Case Management/Adjunct)  Date:  11/02/2016  Time:  12:55 AM  Type of Therapy:  Group Therapy  Participation Level:  Minimal    Summary of Progress/Problems: Left Early.   [name] 11/02/2016, 12:55 AM
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
## 2016-11-02 NOTE — BHH Group Notes (Signed)
[facility] LCSW Group Therapy Note  Date/Time:11/02/16, 1300  Type of Therapy and Topic:  Group Therapy:  Overcoming Obstacles  Participation Level:  Pt did not attend group.  Description of Group:    In this group patients will be encouraged to explore what they see as obstacles to their own wellness and recovery. They will be guided to discuss their thoughts, feelings, and behaviors related to these obstacles. The group will process together ways to cope with barriers, with attention given to specific choices patients can make. Each patient will be challenged to identify changes they are motivated to make in order to overcome their obstacles. This group will be process-oriented, with patients participating in exploration of their own experiences as well as giving and receiving support and challenge from other group members.  Therapeutic Goals: 1. Patient will identify personal and current obstacles as they relate to admission. 2. Patient will identify barriers that currently interfere with their wellness or overcoming obstacles.  3. Patient will identify feelings, thought process and behaviors related to these barriers. 4. Patient will identify two changes they are willing to make to overcome these obstacles:    Summary of Patient Progress      Therapeutic Modalities:   Cognitive Behavioral Therapy Solution Focused Therapy Motivational Interviewing Relapse Prevention Therapy  Lurline Idol, LCSW

## 2016-11-02 NOTE — Plan of Care (Signed)
Problem: Safety: Goal: Ability to remain free from injury will improve Outcome: Progressing Pt safe on the unit at this time   

## 2016-11-02 NOTE — Progress Notes (Signed)
Recreation Therapy Notes  According to patient's doctor, patient is disorganized and her thought content is hard to follow. LRT will attempt assessment at a later time.  Leonette Monarch,  LRT/CTRS 11/02/2016 2:50 PM

## 2016-11-02 NOTE — BHH Suicide Risk Assessment (Signed)
Alice Simmons INPATIENT:  Family/Significant Other Suicide Prevention Education  Suicide Prevention Education:  Education Completed; Alice Simmons, husband, [phone], has been identified by the patient as the family member/significant other with whom the patient will be residing, and identified as the person(s) who will aid the patient in the event of a mental health crisis (suicidal ideations/suicide attempt).  With written consent from the patient, the family member/significant other has been provided the following suicide prevention education, prior to the and/or following the discharge of the patient.  The suicide prevention education provided includes the following:  Suicide risk factors  Suicide prevention and interventions  National Suicide Hotline telephone number  [facility] assessment telephone number  [facility] Emergency Assistance [place] and/or Residential Mobile Crisis Unit telephone number  Request made of family/significant other to:  Remove weapons (e.g., guns, rifles, knives), all items previously/currently identified as safety concern.  Alice Simmons owns a gun but it is locked up and pt does not have access.  Remove drugs/medications (over-the-counter, prescriptions, illicit drugs), all items previously/currently identified as a safety concern. Alice Simmons dispenses medications to pt and does not keep extra around.  The family member/significant other verbalizes understanding of the suicide prevention education information provided.  The family member/significant other agrees to remove the items of safety concern listed above.  Alice Simmons saw pt this weekend and she seemed to be doing better.  Pt had one instance of suicidal thoughts years ago but none currently, issue has been much more about confusion currently.  Pt does not want to (or is not able to) return to [facility] for outpatient care.  We discussed [place]/CBC as an option.  Alice Chars,  LCSW 11/02/2016, 11:34 AM

## 2016-11-02 NOTE — Progress Notes (Signed)
Recreation Therapy Notes  Date: 05.21.18 Time: 9:30 am Location: Craft Room  Group Topic: Self-expression  Goal Area(s) Addresses:  Patient will be able to identify a color that represents each emotion. Patient will verbalize benefit of using art as a means of self-expression. Patient will verbalize one emotion experienced while participating in activity.  Behavioral Response: Left early  Intervention: The Colors Within Me  Activity: Patients were given a blank face worksheet and were instructed to pick a color for each emotion they were feeling and show on the worksheet how much of that emotion they were feeling.  Education: LRT educated patient on other forms of self-expression.  Education Outcome: Patient left before LRT educated group.  Clinical Observations/Feedback: Patient arrived to group and was questioning if she was supposed to be here. LRT informed patient she was supposed to be in this group. After LRT explained activity, patient stated she was going back to her room because her of her emotions. LRT had a difficult time understanding what patient was saying. When patient was leaving, she stated, "I love you." Patient left at approximately 9:40 am and did not return to group.  Leonette Monarch, LRT/CTRS 11/02/2016 10:14 AM

## 2016-11-02 NOTE — Progress Notes (Signed)
D: Pt denies SI/HI/AVH. Pt is pleasant and cooperative. Pt stated she was ready to go home. ptr seen on unit at times this evening.  A: Pt was offered support and encouragement. Pt was given scheduled medications. Pt was encourage to attend groups. Q 15 minute checks were done for safety.   R:Pt attends groups and interacts well with peers and staff. Pt is taking medication. Pt has no complaints.Pt receptive to treatment and safety maintained on unit.

## 2016-11-02 NOTE — Progress Notes (Addendum)
Patient is asking for discharge when staff tried to explain patient got angry states "I am not stupid"then started crying.Patient talks disorganized and does not make any sense.Patients mood is very labile.Denies suicidal or homicidal ideations and AV hallucinations.Compliant with medications with much encouragement.Attended groups.Support & encouragement given.

## 2016-11-03 ENCOUNTER — Other Ambulatory Visit: Payer: Self-pay | Admitting: Family Medicine

## 2016-11-03 MED ORDER — ATORVASTATIN CALCIUM 20 MG PO TABS: 20.0000 mg | ORAL_TABLET | Freq: Every day | ORAL | 0 refills | Status: DC

## 2016-11-03 MED ORDER — MAGIC MOUTHWASH W/LIDOCAINE
2.0000 mL | Freq: Three times a day (TID) | ORAL | Status: DC
Start: 2016-11-03 — End: 2016-11-03
  Filled 2016-11-03 (×3): qty 5

## 2016-11-03 MED ORDER — PALIPERIDONE ER 3 MG PO TB24
6.0000 mg | ORAL_TABLET | Freq: Every day | ORAL | Status: DC
  Administered 2016-11-03: 6 mg via ORAL
  Filled 2016-11-03: qty 2

## 2016-11-03 MED ORDER — PALIPERIDONE PALMITATE 234 MG/1.5ML IM SUSP: 234.0000 mg | Freq: Once | INTRAMUSCULAR | 0 refills | Status: DC

## 2016-11-03 MED ORDER — AMLODIPINE BESYLATE 10 MG PO TABS: 10.0000 mg | ORAL_TABLET | Freq: Every day | ORAL | 0 refills | Status: DC

## 2016-11-03 MED ORDER — LOSARTAN POTASSIUM 50 MG PO TABS: 50.0000 mg | ORAL_TABLET | Freq: Every day | ORAL | 0 refills | Status: DC

## 2016-11-03 MED ORDER — LORAZEPAM 1 MG PO TABS
1.5000 mg | ORAL_TABLET | Freq: Every day | ORAL | Status: DC
  Administered 2016-11-03 – 2016-11-04 (×2): 1.5 mg via ORAL
  Filled 2016-11-03 (×2): qty 3

## 2016-11-03 MED ORDER — LIDOCAINE VISCOUS 2 % MT SOLN
2.0000 mL | Freq: Three times a day (TID) | OROMUCOSAL | Status: DC
  Administered 2016-11-03 – 2016-11-06 (×11): 2 mL via OROMUCOSAL
  Filled 2016-11-03 (×15): qty 5

## 2016-11-03 MED ORDER — OLANZAPINE 5 MG PO TBDP
15.0000 mg | ORAL_TABLET | Freq: Every day | ORAL | Status: DC
  Administered 2016-11-03: 15 mg via ORAL
  Filled 2016-11-03: qty 3

## 2016-11-03 MED ORDER — BENZOCAINE 10 % MT GEL
Freq: Four times a day (QID) | OROMUCOSAL | Status: DC | PRN
  Filled 2016-11-03: qty 9
  Filled 2016-11-03: qty 9.4

## 2016-11-03 MED ORDER — MAGIC MOUTHWASH
2.0000 mL | Freq: Three times a day (TID) | ORAL | Status: DC
Start: 2016-11-03 — End: 2016-11-06
  Administered 2016-11-03 – 2016-11-06 (×11): 2 mL via ORAL
  Filled 2016-11-03 (×15): qty 5

## 2016-11-03 NOTE — BHH Group Notes (Signed)
[facility] Group Notes:  (Nursing/MHT/Case Management/Adjunct)  Date:  11/03/2016  Time:  12:02 AM  Type of Therapy:  Group Therapy  Participation Level:  Active  Participation Quality:  Appropriate  Affect:  Appropriate  Cognitive:  Appropriate  Insight:  Appropriate  Engagement in Group:  Engaged  Modes of Intervention:  Discussion  Summary of Progress/Problems:  Kandis Fantasia 11/03/2016, 12:02 AM

## 2016-11-03 NOTE — Social Work (Signed)
CSW reviewed aftercare w husband, Sid Falcon, husband, [phone].  Agreeable to appointment at [facility]  Edwyna Shell, LCSW Lead Clinical Social Worker Phone:  [phone]

## 2016-11-03 NOTE — Progress Notes (Signed)
Pt began complaining and got argumentative stating she wanted to leave . Writer tried to explain to pt that she was IVC'd and pt stated " I know my rights I can leave when I want". Pt was informed that she needed to talk with the Doctor and if she were Voluntary she needs to sign a 72 hr request for D/C.

## 2016-11-03 NOTE — BHH Group Notes (Signed)
[facility] LCSW Group Therapy Note  Date/Time: 11/03/2016; 2:00 PM  Type of Therapy/Topic:  Group Therapy:  Feelings about Diagnosis  Participation Level:  Minimal   Mood: Appropriate    Description of Group:    This group will allow patients to explore their thoughts and feelings about diagnoses they have received. Patients will be guided to explore their level of understanding and acceptance of these diagnoses. Facilitator will encourage patients to process their thoughts and feelings about the reactions of others to their diagnosis, and will guide patients in identifying ways to discuss their diagnosis with significant others in their lives. This group will be process-oriented, with patients participating in exploration of their own experiences as well as giving and receiving support and challenge from other group members.   Therapeutic Goals: 1. Patient will demonstrate understanding of diagnosis as evidence by identifying two or more symptoms of the disorder:  2. Patient will be able to express two feelings regarding the diagnosis 3. Patient will demonstrate ability to communicate their needs through discussion and/or role plays  Summary of Patient Progress: Patient stated that she understands her diagnosis and stated that she is "happy" that she knows what is going on with her. She stated that she is not alone with her diagnosis, and many people are living with a diagnosis of bipolar.    Therapeutic Modalities:   Cognitive Behavioral Therapy Brief Therapy Feelings Identification   Glorious Peach, MSW, LCSW-A 11/03/2016, 2:53PM

## 2016-11-03 NOTE — Plan of Care (Signed)
Problem: Safety: Goal: Ability to remain free from injury will improve Outcome: Progressing Pt safe on the unit at this time   

## 2016-11-03 NOTE — Progress Notes (Signed)
D: Pt very labile requesting to leave. Pt continued encouraged to talk with the doctor. Pt requested a Spanish bible, per [place] they are currently out at this time.   A: Pt was offered support and encouragement. Pt was given scheduled medications. Pt was encourage to attend groups. Q 15 minute checks were done for safety.   R: safety maintained on unit. Pt calmed down later but was still a little anxious .

## 2016-11-03 NOTE — Progress Notes (Signed)
Recreation Therapy Notes  Date: 05.22.18 Time: 9:30 am Location: Craft Room  Group Topic: Goal Setting  Goal Area(s) Addresses:  Patient will write at least one goal. Patient will write at least one obstacle.  Behavioral Response: Attentive  Intervention: Recovery Goal Chart  Activity: Patients were instructed to make a Recovery Goal Chart including their goals, obstacles, the date they started working on their goal, and the date they achieved their goal.  Education: LRT educated patients on healthy ways to celebrate reaching their goals.  Education Outcome: Patient left before LRT educated group.  Clinical Observations/Feedback: Patient worked on Radio producer. Patient did not contribute to group discussion.  Leonette Monarch, LRT/CTRS 11/03/2016 10:11 AM
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
## 2016-11-03 NOTE — Progress Notes (Signed)
Patient talks little more organized and less labile today.Patient was not ready for the new medicine Invega.Took it with little encouragement.Pleasant & cooperative in the the unit.No aggressive behaviors noted.Attended some groups.Appetite & energy level good.Support &encouragement given.

## 2016-11-04 MED ORDER — TOPIRAMATE 100 MG PO TABS
100.0000 mg | ORAL_TABLET | Freq: Every day | ORAL | Status: DC
  Administered 2016-11-04: 100 mg via ORAL
  Filled 2016-11-04: qty 1

## 2016-11-04 MED ORDER — PALIPERIDONE ER 3 MG PO TB24
9.0000 mg | ORAL_TABLET | Freq: Every day | ORAL | Status: DC
  Administered 2016-11-04 – 2016-11-05 (×2): 9 mg via ORAL
  Filled 2016-11-04 (×2): qty 3

## 2016-11-04 MED ORDER — DIPHENHYDRAMINE HCL 25 MG PO CAPS
25.0000 mg | ORAL_CAPSULE | Freq: Every day | ORAL | Status: DC
  Administered 2016-11-04 – 2016-11-05 (×2): 25 mg via ORAL
  Filled 2016-11-04 (×2): qty 1

## 2016-11-04 MED ORDER — PALIPERIDONE PALMITATE 234 MG/1.5ML IM SUSP
234.0000 mg | Freq: Once | INTRAMUSCULAR | Status: AC
  Administered 2016-11-04: 234 mg via INTRAMUSCULAR
  Filled 2016-11-04: qty 1.5

## 2016-11-04 MED ORDER — OLANZAPINE 5 MG PO TBDP
10.0000 mg | ORAL_TABLET | Freq: Every day | ORAL | Status: DC
  Administered 2016-11-04: 10 mg via ORAL
  Filled 2016-11-04: qty 2

## 2016-11-04 NOTE — Progress Notes (Signed)
D: Pt very labile requesting to leave. Pt continued encouraged to talk with the doctor.   A: Pt was offered support and encouragement. Pt was given scheduled medications. Pt was encourage to attend groups. Q 15 minute checks were done for safety.   R: safety maintained on unit. Pt calm  but was still a little anxious
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
## 2016-11-04 NOTE — Progress Notes (Signed)
Recreation Therapy Notes  Date: 05.23.18 Time: 9:30 am Location: Craft Room  Group Topic: Self-esteem  Goal Area(s) Addresses:  Patient will write at least one positive trait about self. Patient will verbalize benefit of having a healthy self-esteem.  Behavioral Response: Intermittently Attentive  Intervention: I Am  Activity: Patients were given a worksheet with the letter I on it and were instructed to write as many positive traits about themselves inside the letter.  Education: LRT educated patients on ways to increase their self-esteem.   Education Outcome: In group clarification offered  Clinical Observations/Feedback: Patient arrived to group with papers and booklets. Patient reported she did not want the booklets anymore. Patient sat in group but did not want to participate in activity. Patient started crying because she wanted to go outside to pick flowers. Patient left group. Patient returned to group. Patient asked about activity. LRT explained activity. Patient wrote on her worksheet in [place]. Patient did not contribute to group discussion.  Leonette Monarch, LRT/CTRS 11/04/2016 10:16 AM

## 2016-11-04 NOTE — Plan of Care (Signed)
Problem: Safety: Goal: Ability to remain free from injury will improve Outcome: Progressing Pt has remained free from harm to self.

## 2016-11-04 NOTE — Progress Notes (Signed)
D: Affect cheerful on approach . Voice to writer she only had a day with her daughter before she went  Back home . Continue to voice of wanting to go home . Patient husband call this shift  Voice to Probation officer  He wanted her to  go home. Appropriate ADL and personal chores . Appetite good . Voice no concerns around sleep A: Encourage patient participation with unit programming . Instruction  Given on  Medication , verbalize understanding. R: Voice no other concerns. Staff continue to monitor

## 2016-11-04 NOTE — Tx Team (Signed)
Interdisciplinary Treatment and Diagnostic Plan Update 10/30/16 Time of Session: [place] MRN: [id number]  Principal Diagnosis: Bipolar I disorder, most recent episode mixed (Alice Simmons)  Secondary Diagnoses: Principal Problem:   Bipolar I disorder, most recent episode mixed (Alice Simmons) Active Problems:   Tobacco abuse   Hyperlipidemia   Vitamin D deficiency   Chronic pain syndrome   Cannabis use disorder, moderate, dependence (HCC)   Current Medications:  Current Facility-Administered Medications  Medication Dose Route Frequency Provider Last Rate Last Dose  . acetaminophen (TYLENOL) tablet 650 mg  650 mg Oral Q6H PRN Clapacs, [name], MD      . alum & mag hydroxide-simeth (MAALOX/MYLANTA) 200-200-20 MG/5ML suspension 30 mL  30 mL Oral Q4H PRN Clapacs, [name], MD      . amLODipine (NORVASC) tablet 10 mg  10 mg Oral Daily Clapacs, [name], MD   10 mg at 11/04/16 0836  . atorvastatin (LIPITOR) tablet 20 mg  20 mg Oral q1800 Clapacs, [name], MD   20 mg at 11/03/16 1716  . benzocaine (ORAJEL) 10 % mucosal gel   Mouth/Throat QID PRN [name], MD      . diphenhydrAMINE (BENADRYL) capsule 25 mg  25 mg Oral QHS [name], MD      . magic mouthwash  2 mL Oral TID PC & HS [name], MD   2 mL at 11/04/16 0840   And  . lidocaine (XYLOCAINE) 2 % viscous mouth solution 2 mL  2 mL Mouth/Throat TID PC & HS [name], MD   2 mL at 11/04/16 0841  . LORazepam (ATIVAN) tablet 0.5 mg  0.5 mg Oral BID [name], MD   0.5 mg at 11/04/16 0837  . LORazepam (ATIVAN) tablet 1.5 mg  1.5 mg Oral QHS [name], MD   1.5 mg at 11/03/16 2125  . losartan (COZAAR) tablet 50 mg  50 mg Oral Daily Clapacs, [name], MD   50 mg at 11/04/16 0836  . magnesium hydroxide (MILK OF MAGNESIA) suspension 30 mL  30 mL Oral Daily PRN Clapacs, [name], MD      . meloxicam (MOBIC) tablet 7.5 mg  7.5 mg Oral BID  [name], MD   7.5 mg at 11/04/16 6333  . nicotine (NICODERM CQ - dosed in mg/24 hours) patch 21 mg  21 mg Transdermal Daily [name], MD      . OLANZapine zydis (ZYPREXA) disintegrating tablet 10 mg  10 mg Oral QHS [name], MD      . paliperidone (INVEGA SUSTENNA) injection 234 mg  234 mg Intramuscular Once [name], MD      . paliperidone (INVEGA) 24 hr tablet 9 mg  9 mg Oral Daily [name], MD   9 mg at 11/04/16 0837  . topiramate (TOPAMAX) tablet 100 mg  100 mg Oral QHS [name], MD       PTA Medications: Prescriptions Prior to Admission  Medication Sig Dispense Refill Last Dose  . amLODipine (NORVASC) 10 MG tablet TAKE 1 TABLET(10 MG) BY MOUTH DAILY 90 tablet 2 UNKNOWN at UNKNOWN  . atorvastatin (LIPITOR) 20 MG tablet TAKE 1 TABLET(20 MG) BY MOUTH AT BEDTIME 90 tablet 0 UNKNOWN at UNKNOWN  . benzonatate (TESSALON PERLES) 100 MG capsule Take 1 capsule (100 mg total) by mouth every 8 (eight) hours as needed for cough. 30 capsule 0 PRN at PRN  . DULoxetine (CYMBALTA) 30 MG capsule Take 1 capsule (30 mg total) by mouth daily. For 1  week, then two a day; stop paxil 53 capsule 0 UNKNOWN at UNKNOWN  . gabapentin (NEURONTIN) 600 MG tablet TAKE 1 TABLET BY MOUTH THREE TIMES DAILY 90 tablet 3 UNKNOWN at UNKNOWN  . losartan (COZAAR) 50 MG tablet TAKE 1 TABLET BY MOUTH DAILY 30 tablet 5 UNKNOWN at UNKNOWN  . potassium chloride (K-DUR) 10 MEQ tablet Take 1 tablet (10 mEq total) by mouth daily. 5 tablet 0 UNKNOWN at UNKNOWN  . topiramate (TOPAMAX) 100 MG tablet TAKE 2 TABLETS(200 MG) BY MOUTH AT BEDTIME 180 tablet 1 UNKNOWN at UNKNOWN  . traZODone (DESYREL) 50 MG tablet TAKE 1/2 TO 1 TABLET BY MOUTH AT BEDTIME AS NEEDED FOR SLEEP 90 tablet 1 UNKNOWN at UNKNOWN    Patient Stressors: Health problems Medication change or noncompliance Substance abuse  Patient Strengths: Ability for  insight Active sense of humor Capable of independent living Supportive family/friends  Treatment Modalities: Medication Management, Group therapy, Case management,  1 to 1 session with clinician, Psychoeducation, Recreational therapy.   Physician Treatment Plan for Primary Diagnosis: Bipolar I disorder, most recent episode mixed (Alice Simmons) Long Term Goal(s): Improvement in symptoms so as ready for discharge Improvement in symptoms so as ready for discharge   Short Term Goals: Ability to verbalize feelings will improve Ability to demonstrate self-control will improve Compliance with prescribed medications will improve Ability to identify and develop effective coping behaviors will improve Ability to identify triggers associated with substance abuse/mental health issues will improve  Medication Management: Evaluate patient's response, side effects, and tolerance of medication regimen.  Therapeutic Interventions: 1 to 1 sessions, Unit Group sessions and Medication administration.  Evaluation of Outcomes: Not Met  Physician Treatment Plan for Secondary Diagnosis: Principal Problem:   Bipolar I disorder, most recent episode mixed (Alice Simmons) Active Problems:   Tobacco abuse   Hyperlipidemia   Vitamin D deficiency   Chronic pain syndrome   Cannabis use disorder, moderate, dependence (Alice Simmons)  Long Term Goal(s): Improvement in symptoms so as ready for discharge Improvement in symptoms so as ready for discharge   Short Term Goals: Ability to verbalize feelings will improve Ability to demonstrate self-control will improve Compliance with prescribed medications will improve Ability to identify and develop effective coping behaviors will improve Ability to identify triggers associated with substance abuse/mental health issues will improve     Medication Management: Evaluate patient's response, side effects, and tolerance of medication regimen.  Therapeutic Interventions: 1 to 1 sessions, Unit  Group sessions and Medication administration.  Evaluation of Outcomes: Not Met   RN Treatment Plan for Primary Diagnosis: Bipolar I disorder, most recent episode mixed (Alice Simmons) Long Term Goal(s): Knowledge of disease and therapeutic regimen to maintain health will improve  Short Term Goals: Ability to verbalize feelings will improve, Ability to identify and develop effective coping behaviors will improve and Compliance with prescribed medications will improve  Medication Management: RN will administer medications as ordered by provider, will assess and evaluate patient's response and provide education to patient for prescribed medication. RN will report any adverse and/or side effects to prescribing provider.  Therapeutic Interventions: 1 on 1 counseling sessions, Psychoeducation, Medication administration, Evaluate responses to treatment, Monitor vital signs and CBGs as ordered, Perform/monitor CIWA, COWS, AIMS and Fall Risk screenings as ordered, Perform wound care treatments as ordered.  Evaluation of Outcomes: Not Met   LCSW Treatment Plan for Primary Diagnosis: Bipolar I disorder, most recent episode mixed ([place]) Long Term Goal(s): Safe transition to appropriate next level of care at discharge, Engage patient in  therapeutic group addressing interpersonal concerns.  Short Term Goals: Engage patient in aftercare planning with referrals and resources and Increase social support  Therapeutic Interventions: Assess for all discharge needs, 1 to 1 time with Social worker, Explore available resources and support systems, Assess for adequacy in community support network, Educate family and significant other(s) on suicide prevention, Complete Psychosocial Assessment, Interpersonal group therapy.  Evaluation of Outcomes: Not Met   Progress in Treatment: Attending groups: Yes. Participating in groups: Yes. Taking medication as prescribed: Yes. Toleration medication: Yes. Family/Significant other  contact made:Yes, CSW spoke with patient's husband. Patient understands diagnosis: No. Discussing patient identified problems/goals with staff: Yes. Medical problems stabilized or resolved: Yes. Denies suicidal/homicidal ideation: Yes. Issues/concerns per patient self-inventory: No. Other: none   New problem(s) identified: No, Describe:  none  New Short Term/Long Term Goal(s): Patient stated that her goal is to improve her mood.  Discharge Plan or Barriers: CSW assessing for appropriate plan. Reason for Continuation of Hospitalization: Medication stabilization  Estimated Length of Stay: D/C Friday. 5/25  Attendees: Patient: Alice Simmons 11/04/2016   Physician: Dr. Jerilee Hoh, MD 11/04/2016   Nursing: Polly Cobia, RN 11/04/2016   RN Care Manager: 11/04/2016  Social Worker: Glorious Peach, MSW, LCSW-A 11/04/2016   Recreational Therapist: Everitt Amber, LRT/CTRS  11/04/2016   Other:  11/04/2016   Other:  11/04/2016   Other: 11/04/2016     Scribe for Treatment Team: Emilie Rutter, [place] 11/04/2016 11:53 AM

## 2016-11-04 NOTE — [facility] Group Notes (Signed)
May 23/2018   Type of Therapy:  Group Therapy Emotional Regulation Participation Level:  Active  Participation Quality:  Attentive  Affect:  Appropriate  Cognitive:  Appropriate  Insight:  Engaged  Engagement in Therapy:  Developing/Improving  Modes of Intervention:  Activity, Clarification, Discussion, Education, Exploration and Socialization   The purpose of this group is to assist patients in learning to regulate negative emotions and experience positive emotions.  Patients will be guided to discuss ways in which they have been vulnerable to their negative emotions.  These vulnerabilities will be juxtaposed with experiences of positive emotions or situations, and  patients challenged to use positive emotions to combat negative ones.  Special emphasis will be placed on coping with negative emotions in conflict situations,  and patients will process healthy conflict resolution skills.    Therapeutic Goals:   1. Patient will identify two positive emotions or experiences to reflect on in order to balance out negative emotions:    2. Patient will label two or more emotions that they find the most difficult to experience:    3. Patient will be able to demonstrate positive conflict resolution skills through discussion or role plays:      Summary of Progress/Problems: The topic for today was Emotional Regulation Peers were asked to reflect on 2 incidents when they lives were controlled by emotions.  With facilitation from  group Leader those situations were reflected by patient and their peers.  Alice Simmons was able to discuss with her peers her angry outburst. She shared many examples and provided insight to others.This patient was able to be supportive and this patients feelings was supported by this worker and peers Patient were then to review ways to improve thier emotions and to share options that assisted them in reducing thier own emotional disregulation ( taking  medications, keep doctors appointments, create a daily routine,excercize, volunteer, call friend/family and several stress reduction techniques were reviewed.)    Alice Tagliaferri LCSW

## 2016-11-04 NOTE — Plan of Care (Signed)
Problem: Coping: Goal: Ability to cope will improve Outcome: Progressing Working on coping skills   

## 2016-11-04 NOTE — [facility] Group Notes (Signed)
[facility] Group Notes:  (Nursing/MHT/Case Management/Adjunct)  Date:  11/04/2016  Time:  9:39 PM  Type of Therapy:  Group Therapy  Participation Level:  Active  Participation Quality:  Appropriate  Affect:  Appropriate  Cognitive:  Appropriate  Insight:  Appropriate  Engagement in Group:  Engaged  Modes of Intervention:  Activity  Summary of Progress/Problems:  Kandis Fantasia 11/04/2016, 9:39 PM

## 2016-11-04 NOTE — [facility] Group Notes (Signed)
[facility] Group Notes:  (Nursing/MHT/Case Management/Adjunct)  Date:  11/04/2016  Time:  4:42 PM  Type of Therapy:  Psychoeducational Skills  Participation Level:  Active  Participation Quality:  Attentive, Sharing and Supportive  Affect:  Appropriate  Cognitive:  Appropriate  Insight:  Appropriate  Engagement in Group:  Developing/Improving  Modes of Intervention:  Discussion and Education  Summary of Progress/Problems:  Charise Killian 11/04/2016, 4:42 PM

## 2016-11-05 MED ORDER — PALIPERIDONE ER 3 MG PO TB24: 12.0000 mg | ORAL_TABLET | Freq: Every day | ORAL | Status: DC

## 2016-11-05 MED ORDER — PALIPERIDONE ER 6 MG PO TB24: 12.0000 mg | ORAL_TABLET | Freq: Every day | ORAL | 0 refills | Status: DC

## 2016-11-05 MED ORDER — AMANTADINE HCL 100 MG PO CAPS: 100.0000 mg | ORAL_CAPSULE | Freq: Every day | ORAL | 0 refills | Status: DC

## 2016-11-05 MED ORDER — AMANTADINE HCL 100 MG PO CAPS
100.0000 mg | ORAL_CAPSULE | Freq: Every day | ORAL | Status: DC
  Administered 2016-11-05: 100 mg via ORAL
  Filled 2016-11-05: qty 1

## 2016-11-05 MED ORDER — TOPIRAMATE 100 MG PO TABS
100.0000 mg | ORAL_TABLET | Freq: Every day | ORAL | Status: AC
  Administered 2016-11-05: 100 mg via ORAL
  Filled 2016-11-05: qty 1

## 2016-11-05 MED ORDER — PALIPERIDONE PALMITATE 156 MG/ML IM SUSP
156.0000 mg | Freq: Once | INTRAMUSCULAR | Status: AC
  Administered 2016-11-06: 156 mg via INTRAMUSCULAR
  Filled 2016-11-05: qty 1

## 2016-11-05 MED ORDER — LORAZEPAM 2 MG PO TABS
2.0000 mg | ORAL_TABLET | Freq: Every day | ORAL | Status: DC
  Administered 2016-11-05: 2 mg via ORAL
  Filled 2016-11-05: qty 1

## 2016-11-05 MED ORDER — OLANZAPINE 5 MG PO TBDP
10.0000 mg | ORAL_TABLET | Freq: Every day | ORAL | Status: AC
  Administered 2016-11-05: 10 mg via ORAL
  Filled 2016-11-05: qty 2

## 2016-11-05 MED ORDER — DIPHENHYDRAMINE HCL 50 MG PO CAPS: 50.0000 mg | ORAL_CAPSULE | Freq: Every day | ORAL | 0 refills | Status: DC

## 2016-11-05 NOTE — [facility] Group Notes (Signed)
[place] LCSW Group Therapy Note  Type of Therapy and Topic:  Group Therapy:  Goals Group: SMART Goals  Participation Level:  Patient did not attend group. CSW invited patient to group.   Description of Group:   The purpose of a daily goals group is to assist and guide patients in setting recovery/wellness-related goals.  The objective is to set goals as they relate to the crisis in which they were admitted. Patients will be using SMART goal modalities to set measurable goals.  Characteristics of realistic goals will be discussed and patients will be assisted in setting and processing how one will reach their goal. Facilitator will also assist patients in applying interventions and coping skills learned in psycho-education groups to the SMART goal and process how one will achieve defined goal.  Therapeutic Goals: -Patients will develop and document one goal related to or their crisis in which brought them into treatment. -Patients will be guided by LCSW using SMART goal setting modality in how to set a measurable, attainable, realistic and time sensitive goal.  -Patients will process barriers in reaching goal. -Patients will process interventions in how to overcome and successful in reaching goal.   Summary of Patient Progress:  Patient Goal: Patient did not attend group. CSW invited patient to group.    Therapeutic Modalities:   Motivational Interviewing Public relations account executive Therapy Crisis Intervention Model SMART goals setting  Alice Simmons G. [place], [facility] 11/05/2016 9:39 AM
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
## 2016-11-05 NOTE — Progress Notes (Signed)
Recreation Therapy Notes  Date: 05.24.18 Time: 9:30 am Location: Craft Room  Group Topic: Leisure Education  Goal Area(s) Addresses:  Patient will identify things they are grateful for. Patient will identify how being grateful can influence decision making.  Behavioral Response: Attentive, Interactive   Intervention: Grateful Wheel  Activity: Patients were given an I Am Grateful For worksheet and were instructed to write things they are grateful for under each category.  Education: LRT educated patients on leisure.  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 Patient drew on her worksheet. Patient contributed to group discussion by stating things she is grateful for.  Leonette Monarch, LRT/CTRS 11/05/2016 10:07 AM

## 2016-11-05 NOTE — Plan of Care (Signed)
Problem: Coping: Goal: Ability to cope will improve Outcome: Progressing Working on coping skills   

## 2016-11-05 NOTE — Progress Notes (Signed)
D: Patient stated slept good last night .Stated appetite regular and energy level  Is normal. Stated concentration is good . Stated on Depression scale 0, hopeless 0 and anxiety 0 .( low 0-10 high) Denies suicidal  homicidal ideations  .  No auditory hallucinations  No pain concerns . Appropriate ADL'S. Interacting with peers and staff. Affect cheerful on approach . Writer observed patient  Dancing in room  Continue to voice of wanting to go home . Stated she is not depressed.  A: Encourage patient participation with unit programming . Instruction  Given on  Medication , verbalize understanding. R: Voice no other concerns. Staff continue to monitor

## 2016-11-05 NOTE — [facility] Group Notes (Signed)
[place] LCSW Group Therapy  11/05/2016 1:13 PM  Type of Therapy:  Group Therapy  Participation Level:  Patient did not attend group. CSW invited patient to group.   Summary of Progress/Problems: Balance in life: Patients will discuss the concept of balance and how it looks and feels to be unbalanced. Pt will identify areas in their life that is unbalanced and ways to become more balanced. They discussed what aspects in their lives has influenced their self care. Patients also discussed self care in the areas of self regulation/control, hygiene/appearance, sleep/relaxation, healthy leisure, healthy eating habits, exercise, inner peace/spirituality, self improvement, sobriety, and health management. They were challenged to identify changes that are needed in order to improve self care.  Alice Simmons, [place] 11/05/2016, 1:14 PM

## 2016-11-06 NOTE — Progress Notes (Signed)
Pt slept 4 hours while monitored on 15 minute safety checks.

## 2016-11-06 NOTE — Progress Notes (Signed)
Recreation Therapy Notes  Date: 05.25.18 Time: 9:30 am Location: Craft Room  Group Topic: Coping Skills  Goal Area(s) Addresses:  Patient will participate in healthy coping skill.  Patient will verbalize benefit of using art as a coping skill.  Behavioral Response: Attentive, Left early  Intervention: Coloring  Activity: Patients were given coloring sheets to color and were instructed to think of the emotions they were feeling and what their minds were focused on.  Education: LRT educated patients on healthy coping skills.  Education Outcome: Patient left before LRT educated group.  Clinical Observations/Feedback: Patient colored coloring sheet. Patient left group at approximately 10:12 am with Dr. Jerilee Hoh and did not return to group.  Leonette Monarch, LRT/CTRS 11/06/2016 10:25 AM
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
## 2016-11-06 NOTE — Progress Notes (Signed)
D: Pt reports she feels ready for discharge tomorrow.  Denies s/i, h/i or hallucinations.  Pt is more social with peers and participating in groups.  Reports she feels much better then when she came here. A:Work on discharge planning with pt. Monitor safety on 15 minute safety checke. R:Brighter affect, more social and states readiness for discharge.

## 2016-11-06 NOTE — Tx Team (Signed)
Interdisciplinary Treatment and Diagnostic Plan Update  11/06/2016 Time of Session: 11:00am Alice Simmons MRN: [id number]  Principal Diagnosis: Bipolar I disorder, most recent episode mixed ([place])  Secondary Diagnoses: Principal Problem:   Bipolar I disorder, most recent episode mixed ([place]) Active Problems:   Tobacco abuse   Hyperlipidemia   Vitamin D deficiency   Chronic pain syndrome   Cannabis use disorder, moderate, dependence (HCC)   Current Medications:  Current Facility-Administered Medications  Medication Dose Route Frequency Provider Last Rate Last Dose  . acetaminophen (TYLENOL) tablet 650 mg  650 mg Oral Q6H PRN Clapacs, [name], MD   650 mg at 11/04/16 1935  . alum & mag hydroxide-simeth (MAALOX/MYLANTA) 200-200-20 MG/5ML suspension 30 mL  30 mL Oral Q4H PRN Clapacs, [name], MD      . amantadine (SYMMETREL) capsule 100 mg  100 mg Oral QHS [name], MD   100 mg at 11/05/16 2237  . amLODipine (NORVASC) tablet 10 mg  10 mg Oral Daily Clapacs, [name], MD   10 mg at 11/06/16 0742  . atorvastatin (LIPITOR) tablet 20 mg  20 mg Oral q1800 Clapacs, [name], MD   20 mg at 11/05/16 1732  . benzocaine (ORAJEL) 10 % mucosal gel   Mouth/Throat QID PRN [name], MD      . diphenhydrAMINE (BENADRYL) capsule 25 mg  25 mg Oral QHS [name], MD   25 mg at 11/05/16 2236  . magic mouthwash  2 mL Oral TID PC & HS [name], MD   2 mL at 11/06/16 0742   And  . lidocaine (XYLOCAINE) 2 % viscous mouth solution 2 mL  2 mL Mouth/Throat TID PC & HS [name], MD   2 mL at 11/06/16 0742  . losartan (COZAAR) tablet 50 mg  50 mg Oral Daily Clapacs, [name], MD   50 mg at 11/06/16 0742  . meloxicam (MOBIC) tablet 7.5 mg  7.5 mg Oral BID [name], MD   7.5 mg at 11/06/16 0742  . nicotine (NICODERM CQ - dosed in mg/24 hours) patch 21 mg  21 mg Transdermal Daily [name], MD       . paliperidone (INVEGA SUSTENNA) injection 156 mg  156 mg Intramuscular Once [name], MD      . paliperidone (INVEGA) 24 hr tablet 12 mg  12 mg Oral QHS [name], MD       PTA Medications: Prescriptions Prior to Admission  Medication Sig Dispense Refill Last Dose  . amLODipine (NORVASC) 10 MG tablet TAKE 1 TABLET(10 MG) BY MOUTH DAILY 90 tablet 2 UNKNOWN at UNKNOWN  . atorvastatin (LIPITOR) 20 MG tablet TAKE 1 TABLET(20 MG) BY MOUTH AT BEDTIME 90 tablet 0 UNKNOWN at UNKNOWN  . benzonatate (TESSALON PERLES) 100 MG capsule Take 1 capsule (100 mg total) by mouth every 8 (eight) hours as needed for cough. 30 capsule 0 PRN at PRN  . DULoxetine (CYMBALTA) 30 MG capsule Take 1 capsule (30 mg total) by mouth daily. For 1 week, then two a day; stop paxil 53 capsule 0 UNKNOWN at UNKNOWN  . gabapentin (NEURONTIN) 600 MG tablet TAKE 1 TABLET BY MOUTH THREE TIMES DAILY 90 tablet 3 UNKNOWN at UNKNOWN  . losartan (COZAAR) 50 MG tablet TAKE 1 TABLET BY MOUTH DAILY 30 tablet 5 UNKNOWN at UNKNOWN  . potassium chloride (K-DUR) 10 MEQ tablet Take 1 tablet (10 mEq total) by mouth daily. 5 tablet 0 UNKNOWN at UNKNOWN  . topiramate (TOPAMAX) 100 MG  tablet TAKE 2 TABLETS(200 MG) BY MOUTH AT BEDTIME 180 tablet 1 UNKNOWN at UNKNOWN  . traZODone (DESYREL) 50 MG tablet TAKE 1/2 TO 1 TABLET BY MOUTH AT BEDTIME AS NEEDED FOR SLEEP 90 tablet 1 UNKNOWN at UNKNOWN    Patient Stressors: Health problems Medication change or noncompliance Substance abuse  Patient Strengths: Ability for insight Active sense of humor Capable of independent living Supportive family/friends  Treatment Modalities: Medication Management, Group therapy, Case management,  1 to 1 session with clinician, Psychoeducation, Recreational therapy.   Physician Treatment Plan for Primary Diagnosis: Bipolar I disorder, most recent episode mixed ([place]) Long Term Goal(s): Improvement in symptoms so as ready for  discharge Improvement in symptoms so as ready for discharge   Short Term Goals: Ability to verbalize feelings will improve Ability to demonstrate self-control will improve Compliance with prescribed medications will improve Ability to identify and develop effective coping behaviors will improve Ability to identify triggers associated with substance abuse/mental health issues will improve  Medication Management: Evaluate patient's response, side effects, and tolerance of medication regimen.  Therapeutic Interventions: 1 to 1 sessions, Unit Group sessions and Medication administration.  Evaluation of Outcomes: Adequate for Discharge  Physician Treatment Plan for Secondary Diagnosis: Principal Problem:   Bipolar I disorder, most recent episode mixed ([place]) Active Problems:   Tobacco abuse   Hyperlipidemia   Vitamin D deficiency   Chronic pain syndrome   Cannabis use disorder, moderate, dependence ([place])  Long Term Goal(s): Improvement in symptoms so as ready for discharge Improvement in symptoms so as ready for discharge   Short Term Goals: Ability to verbalize feelings will improve Ability to demonstrate self-control will improve Compliance with prescribed medications will improve Ability to identify and develop effective coping behaviors will improve Ability to identify triggers associated with substance abuse/mental health issues will improve     Medication Management: Evaluate patient's response, side effects, and tolerance of medication regimen.  Therapeutic Interventions: 1 to 1 sessions, Unit Group sessions and Medication administration.  Evaluation of Outcomes: Adequate for Discharge   RN Treatment Plan for Primary Diagnosis: Bipolar I disorder, most recent episode mixed ([place]) Long Term Goal(s): Knowledge of disease and therapeutic regimen to maintain health will improve  Short Term Goals: Ability to verbalize feelings will improve, Ability to identify and develop  effective coping behaviors will improve and Compliance with prescribed medications will improve  Medication Management: RN will administer medications as ordered by provider, will assess and evaluate patient's response and provide education to patient for prescribed medication. RN will report any adverse and/or side effects to prescribing provider.  Therapeutic Interventions: 1 on 1 counseling sessions, Psychoeducation, Medication administration, Evaluate responses to treatment, Monitor vital signs and CBGs as ordered, Perform/monitor CIWA, COWS, AIMS and Fall Risk screenings as ordered, Perform wound care treatments as ordered.  Evaluation of Outcomes: Adequate for Discharge   LCSW Treatment Plan for Primary Diagnosis: Bipolar I disorder, most recent episode mixed ([place]) Long Term Goal(s): Safe transition to appropriate next level of care at discharge, Engage patient in therapeutic group addressing interpersonal concerns.  Short Term Goals: Engage patient in aftercare planning with referrals and resources and Increase social support  Therapeutic Interventions: Assess for all discharge needs, 1 to 1 time with Social worker, Explore available resources and support systems, Assess for adequacy in community support network, Educate family and significant other(s) on suicide prevention, Complete Psychosocial Assessment, Interpersonal group therapy.  Evaluation of Outcomes: Adequate for Discharge   Progress in Treatment: Attending groups: Yes.  Participating in groups: Yes. Taking medication as prescribed: Yes. Toleration medication: Yes. Family/Significant other contact made: Yes, individual(s) contacted:  spouse Patient understands diagnosis: Yes. Discussing patient identified problems/goals with staff: Yes. Medical problems stabilized or resolved: Yes. Denies suicidal/homicidal ideation: Yes. Issues/concerns per patient self-inventory: No. Other: n/a  New problem(s) identified: None  identified at this time.   New Short Term/Long Term Goal(s): None identified at this time.   Discharge Plan or Barriers: Patient will return home and follow-up with [facility] for outpatient services.   Reason for Continuation of Hospitalization: Anticipated discharge 11/06/2016  Estimated Length of Stay: Anticipated discharge 11/06/2016  Attendees: Patient: 11/06/2016 11:41 AM  Physician: Dr. Merlyn AlbertPatsey Berthold, MD 11/06/2016 11:41 AM  Nursing: Tyler Pita, RN 11/06/2016 11:41 AM  RN Care Manager: 11/06/2016 11:41 AM  Social Worker: Lear Ng. Claybon Jabs MSW, [place] 11/06/2016 11:41 AM  Recreational Therapist: Leonette Monarch, LRT/CTRS 11/06/2016 11:41 AM  Other:  11/06/2016 11:41 AM  Other:  11/06/2016 11:41 AM  Other: 11/06/2016 11:41 AM    Scribe for Treatment Team: Jolaine Click, LCSWA 11/06/2016 11:44 AM

## 2016-11-06 NOTE — Progress Notes (Signed)
  [facility] Adult Case Management Discharge Plan :  Will you be returning to the same living situation after discharge:  Yes,  home with husband At discharge, do you have transportation home?: Yes,  husband Do you have the ability to pay for your medications: Yes,  patient has insurance  Release of information consent forms completed and in the chart;  Patient's signature needed at discharge.  Patient to Follow up at: Follow-up Information    [facility] Follow up on 11/16/2016.   Specialty:  Behavioral Health Why:  Medications management w Dr Gretel Acre on Mon June 4 at 9:45 AM.  Please call to cancel/reschedule if needed.  Contact information: [place] [phone]       [place] in 7 day(s).   Why:  Follow-up with [facility] for outpatient or crisis services within 7 days of discharge.  Contact information: [address] [place] [address] [phone]           Next level of care provider has access to [place] and Suicide Prevention discussed: Yes,  with patient and husband  Have you used any form of tobacco in the last 30 days? (Cigarettes, Smokeless Tobacco, Cigars, and/or Pipes): Yes  Has patient been referred to the Quitline?: Patient refused referral  Patient has been referred for addiction treatment: Yes  Mellody Masri G. Jenera, [place] 11/06/2016, 12:14 PM

## 2016-11-06 NOTE — BHH Group Notes (Addendum)
[place] LCSW Group Therapy   11/06/2016 1 PM   Type of Therapy: Group Therapy   Participation Level: Active   Participation Quality: Attentive, Sharing and Supportive   Affect: Appropriate   Cognitive: Alert and Oriented   Insight: Developing/Improving and Engaged   Engagement in Therapy: Developing/Improving and Engaged   Modes of Intervention: Clarification, Confrontation, Discussion, Education, Exploration, Limit-setting, Orientation, Problem-solving, Rapport Building, Art therapist, Socialization and Support   Summary of Progress/Problems: The topic for today was feelings about relapse. Pt discussed what relapse prevention is to them and identified triggers that they are on the path to relapse. Pt processed their feeling towards relapse and was able to relate to peers. Pt discussed coping skills that can be used for relapse prevention. Patient expressed understanding of mental health relapse. She identified strategies to reduce relapse that would allow patient to stay healthy.   Glorious Peach, MSW, LCSW-A 11/06/2016, 1:49PM

## 2016-11-06 NOTE — Progress Notes (Signed)
Patient denies SI/HI, denies A/V hallucinations. Patient verbalizes understanding of discharge instructions, follow up care and prescriptions. Patient given all belongings from  locker. Patient escorted out by staff, transported by family. 

## 2016-11-06 NOTE — BHH Group Notes (Signed)
[facility] Group Notes:  (Nursing/MHT/Case Management/Adjunct)  Date:  11/06/2016  Time:  5:53 AM  Type of Therapy:  Psychoeducational Skills  Participation Level:  Active  Participation Quality:  Appropriate and Sharing  Affect:  Appropriate  Cognitive:  Appropriate  Insight:  Appropriate and Good  Engagement in Group:  Engaged  Modes of Intervention:  Discussion, Socialization and Support  Summary of Progress/Problems:  Reece Agar 11/06/2016, 5:53 AM

## 2016-11-06 NOTE — BHH Suicide Risk Assessment (Signed)
[facility] Discharge Suicide Risk Assessment   Principal Problem: Bipolar I disorder, most recent episode mixed [facility]) Discharge Diagnoses:  Patient Active Problem List   Diagnosis Date Noted  . Cannabis use disorder, moderate, dependence ([place]) [F12.20] 10/29/2016  . Bipolar I disorder, most recent episode mixed ([place]) [F31.60] 10/29/2016  . Chronic pain syndrome [G89.4] 03/21/2015  . Hyperlipidemia [E78.5] 11/28/2014  . Vitamin D deficiency [E55.9] 11/28/2014  . Tobacco abuse [Z72.0] 05/01/2013     Psychiatric Specialty Exam: ROS                                                         Mental Status Per Nursing Assessment::   On Admission:     Demographic Factors:  Low socioeconomic status  Loss Factors: Decrease in vocational status  Historical Factors: Impulsivity  Risk Reduction Factors:   Sense of responsibility to family, Living with another person, especially a relative and Positive social support  No access to guns  Continued Clinical Symptoms:  Chronic Pain Previous Psychiatric Diagnoses and Treatments  Cognitive Features That Contribute To Risk:  Closed-mindedness    Suicide Risk:  Minimal: No identifiable suicidal ideation.  Patients presenting with no risk factors but with morbid ruminations; may be classified as minimal risk based on the severity of the depressive symptoms  Follow-up [facility] Follow up on 11/16/2016.   Specialty:  Behavioral Health Why:  Medications management w Dr Gretel Acre on Mon June 4 at 9:45 AM.  Please call to cancel/reschedule if needed.  Contact information: [place]           [name], MD 11/06/2016, 8:19 AM

## 2016-11-11 ENCOUNTER — Other Ambulatory Visit: Payer: Self-pay | Admitting: Family Medicine

## 2016-11-11 DIAGNOSIS — R928 Other abnormal and inconclusive findings on diagnostic imaging of breast: Secondary | ICD-10-CM

## 2016-11-12 LAB — HEAVY METALS PROFILE, URINE
ARSENIC (TOTAL),U: NOT DETECTED ug/L (ref 0–50)
Arsenic(Inorganic),U: NOT DETECTED ug/L (ref 0–19)
Creatinine(Crt),U: 0.32 g/L (ref 0.30–3.00)
LEAD RANDOM URINE: 2 ug/L (ref 0–49)
LEAD/CREAT. RATIO: 6 ug/g{creat} (ref 0–49)
Mercury, Ur: NOT DETECTED ug/L (ref 0–19)

## 2016-11-15 ENCOUNTER — Other Ambulatory Visit: Payer: Self-pay | Admitting: Family Medicine

## 2016-11-15 DIAGNOSIS — G43009 Migraine without aura, not intractable, without status migrainosus: Secondary | ICD-10-CM

## 2016-11-16 ENCOUNTER — Encounter: Payer: Self-pay | Admitting: Psychiatry

## 2016-11-16 ENCOUNTER — Ambulatory Visit (INDEPENDENT_AMBULATORY_CARE_PROVIDER_SITE_OTHER): Payer: 59 | Admitting: Psychiatry

## 2016-11-16 ENCOUNTER — Ambulatory Visit: Payer: Medicare Other

## 2016-11-16 VITALS — BP 154/76 | HR 80 | Temp 98.7°F | Wt 124.0 lb

## 2016-11-16 DIAGNOSIS — F3160 Bipolar disorder, current episode mixed, unspecified: Secondary | ICD-10-CM | POA: Diagnosis not present

## 2016-11-16 MED ORDER — PALIPERIDONE ER 6 MG PO TB24: 6.0000 mg | ORAL_TABLET | Freq: Every day | ORAL | 0 refills | Status: DC

## 2016-11-16 MED ORDER — TRAZODONE HCL 50 MG PO TABS: 50.0000 mg | ORAL_TABLET | Freq: Every day | ORAL | 0 refills | Status: DC

## 2016-11-16 MED ORDER — PALIPERIDONE PALMITATE 156 MG/ML IM SUSP: 156.0000 mg | Freq: Once | INTRAMUSCULAR | 0 refills | Status: DC

## 2016-11-16 MED ORDER — PALIPERIDONE PALMITATE 156 MG/ML IM SUSP
156.0000 mg | Freq: Once | INTRAMUSCULAR | Status: AC
  Administered 2016-11-18: 156 mg via INTRAMUSCULAR

## 2016-11-16 MED ORDER — AMANTADINE HCL 100 MG PO CAPS: 100.0000 mg | ORAL_CAPSULE | Freq: Two times a day (BID) | ORAL | 0 refills | Status: DC
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
## 2016-11-17 ENCOUNTER — Other Ambulatory Visit: Payer: Self-pay | Admitting: Psychiatry

## 2016-11-18 ENCOUNTER — Telehealth: Payer: Self-pay

## 2016-11-18 ENCOUNTER — Encounter: Payer: Self-pay | Admitting: Psychiatry

## 2016-11-18 ENCOUNTER — Ambulatory Visit (INDEPENDENT_AMBULATORY_CARE_PROVIDER_SITE_OTHER): Payer: Medicare Other

## 2016-11-18 ENCOUNTER — Ambulatory Visit (INDEPENDENT_AMBULATORY_CARE_PROVIDER_SITE_OTHER): Payer: 59 | Admitting: Psychiatry

## 2016-11-18 ENCOUNTER — Other Ambulatory Visit: Payer: Self-pay | Admitting: Psychiatry

## 2016-11-18 VITALS — BP 144/75 | HR 96 | Temp 98.4°F | Wt 125.0 lb

## 2016-11-18 DIAGNOSIS — F3160 Bipolar disorder, current episode mixed, unspecified: Secondary | ICD-10-CM
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
## 2016-11-18 NOTE — Telephone Encounter (Signed)
pt came into office on 11-18-16 for the injection please place next rx order for injection into pharamcy .

## 2016-11-30 ENCOUNTER — Other Ambulatory Visit: Payer: Self-pay | Admitting: Psychiatry

## 2016-11-30 MED ORDER — PALIPERIDONE PALMITATE 156 MG/ML IM SUSP: 156.0000 mg | Freq: Once | INTRAMUSCULAR | 0 refills | Status: DC

## 2016-12-02 ENCOUNTER — Other Ambulatory Visit: Payer: Self-pay | Admitting: Psychiatry

## 2016-12-02 NOTE — Telephone Encounter (Signed)
done

## 2016-12-07 ENCOUNTER — Telehealth: Payer: Self-pay

## 2016-12-07 NOTE — Telephone Encounter (Signed)
pt husband called states that his wife is having really bad anxiety. pt was advised to go to ER. but pt husband does not want to send her to ER. He wants Dr. Gretel Acre to call.

## 2016-12-08 ENCOUNTER — Ambulatory Visit: Payer: Medicare Other | Admitting: Psychiatry

## 2016-12-11 ENCOUNTER — Other Ambulatory Visit: Payer: Self-pay | Admitting: Psychiatry

## 2016-12-17 ENCOUNTER — Ambulatory Visit (INDEPENDENT_AMBULATORY_CARE_PROVIDER_SITE_OTHER): Payer: 59 | Admitting: Psychiatry

## 2016-12-17 ENCOUNTER — Ambulatory Visit (INDEPENDENT_AMBULATORY_CARE_PROVIDER_SITE_OTHER): Payer: Medicare Other

## 2016-12-17 ENCOUNTER — Other Ambulatory Visit: Payer: Self-pay | Admitting: Psychiatry

## 2016-12-17 ENCOUNTER — Encounter: Payer: Self-pay | Admitting: Psychiatry

## 2016-12-17 VITALS — BP 126/73 | HR 96 | Temp 99.0°F | Wt 123.6 lb

## 2016-12-17 DIAGNOSIS — F411 Generalized anxiety disorder: Secondary | ICD-10-CM | POA: Diagnosis not present

## 2016-12-17 DIAGNOSIS — F3160 Bipolar disorder, current episode mixed, unspecified: Secondary | ICD-10-CM

## 2016-12-17 DIAGNOSIS — F410 Panic disorder [episodic paroxysmal anxiety] without agoraphobia: Secondary | ICD-10-CM

## 2016-12-17 DIAGNOSIS — F332 Major depressive disorder, recurrent severe without psychotic features: Secondary | ICD-10-CM

## 2016-12-17 DIAGNOSIS — F602 Antisocial personality disorder: Secondary | ICD-10-CM

## 2016-12-17 DIAGNOSIS — F4323 Adjustment disorder with mixed anxiety and depressed mood: Secondary | ICD-10-CM

## 2016-12-17 MED ORDER — PALIPERIDONE ER 6 MG PO TB24: 6.0000 mg | ORAL_TABLET | Freq: Every day | ORAL | 0 refills | Status: DC

## 2016-12-17 MED ORDER — PALIPERIDONE PALMITATE 156 MG/ML IM SUSP
156.0000 mg | Freq: Once | INTRAMUSCULAR | Status: AC
  Administered 2016-12-17: 156 mg via INTRAMUSCULAR

## 2016-12-17 MED ORDER — AMANTADINE HCL 100 MG PO CAPS: 100.0000 mg | ORAL_CAPSULE | Freq: Two times a day (BID) | ORAL | 0 refills | Status: DC

## 2016-12-17 MED ORDER — HYDROXYZINE HCL 10 MG PO TABS: 10.0000 mg | ORAL_TABLET | Freq: Three times a day (TID) | ORAL | 0 refills | Status: DC | PRN

## 2016-12-17 MED ORDER — PALIPERIDONE PALMITATE 156 MG/ML IM SUSP: INTRAMUSCULAR | 0 refills | Status: DC

## 2016-12-17 NOTE — Progress Notes (Signed)
Teller [id number]  s/n # [phone] expt 03-2018  lot # [id number] was given I the left deltoid
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
## 2016-12-23 ENCOUNTER — Other Ambulatory Visit: Payer: Self-pay | Admitting: Psychiatry

## 2016-12-24 ENCOUNTER — Emergency Department
Admission: EM | Admit: 2016-12-24 | Discharge: 2016-12-24 | Disposition: A | Discharge status: Home / Self Care | Payer: Medicare Other | Attending: Emergency Medicine | Admitting: Emergency Medicine

## 2016-12-24 DIAGNOSIS — F17210 Nicotine dependence, cigarettes, uncomplicated: Secondary | ICD-10-CM | POA: Insufficient documentation

## 2016-12-24 DIAGNOSIS — I10 Essential (primary) hypertension: Secondary | ICD-10-CM | POA: Insufficient documentation

## 2016-12-24 DIAGNOSIS — F3289 Other specified depressive episodes: Secondary | ICD-10-CM | POA: Diagnosis not present

## 2016-12-24 DIAGNOSIS — Z79899 Other long term (current) drug therapy: Secondary | ICD-10-CM | POA: Diagnosis not present

## 2016-12-24 DIAGNOSIS — F329 Major depressive disorder, single episode, unspecified: Secondary | ICD-10-CM

## 2016-12-24 DIAGNOSIS — F3160 Bipolar disorder, current episode mixed, unspecified: Secondary | ICD-10-CM | POA: Diagnosis present

## 2016-12-24 DIAGNOSIS — F32A Depression, unspecified: Secondary | ICD-10-CM

## 2016-12-24 DIAGNOSIS — F419 Anxiety disorder, unspecified: Secondary | ICD-10-CM | POA: Diagnosis not present

## 2016-12-24 DIAGNOSIS — E119 Type 2 diabetes mellitus without complications: Secondary | ICD-10-CM | POA: Insufficient documentation

## 2016-12-24 DIAGNOSIS — G43909 Migraine, unspecified, not intractable, without status migrainosus: Secondary | ICD-10-CM

## 2016-12-24 LAB — URINE DRUG SCREEN, QUALITATIVE (ARMC ONLY)
Amphetamines, Ur Screen: NOT DETECTED
Barbiturates, Ur Screen: NOT DETECTED
Benzodiazepine, Ur Scrn: NOT DETECTED
CANNABINOID 50 NG, UR ~~LOC~~: POSITIVE — AB
COCAINE METABOLITE, UR ~~LOC~~: NOT DETECTED
MDMA (ECSTASY) UR SCREEN: NOT DETECTED
Methadone Scn, Ur: NOT DETECTED
OPIATE, UR SCREEN: NOT DETECTED
PHENCYCLIDINE (PCP) UR S: NOT DETECTED
Tricyclic, Ur Screen: NOT DETECTED

## 2016-12-24 LAB — ACETAMINOPHEN LEVEL: Acetaminophen (Tylenol), Serum: <10 ug/mL — ABNORMAL LOW (ref 10–30)

## 2016-12-24 LAB — COMPREHENSIVE METABOLIC PANEL
ALBUMIN: 4 g/dL (ref 3.5–5.0)
ALT: 15 U/L (ref 14–54)
AST: 19 U/L (ref 15–41)
Alkaline Phosphatase: 68 U/L (ref 38–126)
Anion gap: 9 (ref 5–15)
BILIRUBIN TOTAL: 0.6 mg/dL (ref 0.3–1.2)
BUN: 16 mg/dL (ref 6–20)
CHLORIDE: 105 mmol/L (ref 101–111)
CO2: 28 mmol/L (ref 22–32)
CREATININE: 0.6 mg/dL (ref 0.44–1.00)
Calcium: 9.6 mg/dL (ref 8.9–10.3)
GFR calc Af Amer: >60 mL/min (ref >60)
GLUCOSE: 111 mg/dL — AB (ref 65–99)
Potassium: 4.3 mmol/L (ref 3.5–5.1)
Sodium: 142 mmol/L (ref 135–145)
Total Protein: 7.1 g/dL (ref 6.5–8.1)

## 2016-12-24 LAB — CBC
HEMATOCRIT: 39.9 % (ref 35.0–47.0)
Hemoglobin: 13.8 g/dL (ref 12.0–16.0)
MCH: 31.7 pg (ref 26.0–34.0)
MCHC: 34.5 g/dL (ref 32.0–36.0)
MCV: 91.8 fL (ref 80.0–100.0)
PLATELETS: 361 10*3/uL (ref 150–440)
RBC: 4.34 MIL/uL (ref 3.80–5.20)
RDW: 13.6 % (ref 11.5–14.5)
WBC: 5.8 10*3/uL (ref 3.6–11.0)

## 2016-12-24 LAB — SALICYLATE LEVEL: Salicylate Lvl: <7 mg/dL (ref 2.8–30.0)

## 2016-12-24 LAB — ETHANOL

## 2016-12-24 MED ORDER — PAROXETINE HCL 20 MG PO TABS: 20.0000 mg | ORAL_TABLET | Freq: Two times a day (BID) | ORAL | 0 refills | Status: DC

## 2016-12-24 MED ORDER — PALIPERIDONE ER 6 MG PO TB24: 6.0000 mg | ORAL_TABLET | Freq: Every day | ORAL | 0 refills | Status: DC

## 2016-12-24 MED ORDER — TOPIRAMATE 100 MG PO TABS: 100.0000 mg | ORAL_TABLET | Freq: Every day | ORAL | 0 refills | Status: DC

## 2016-12-24 MED ORDER — AMANTADINE HCL 100 MG PO CAPS: 100.0000 mg | ORAL_CAPSULE | Freq: Two times a day (BID) | ORAL | 0 refills | Status: DC

## 2016-12-24 NOTE — ED Notes (Signed)
Pt given lunch tray. Pt belongings returned by husband and brought back by Pt Relations, Kyra Manges for pt discharge.

## 2016-12-24 NOTE — ED Provider Notes (Addendum)
[facility] Emergency Department Provider Note  Time seen: 11:14 AM  I have reviewed the triage vital signs and the nursing notes.   HISTORY  Chief Complaint Depression and Anxiety    HPI Alice Simmons is a 61 y.o. female with a past medical history of anxiety, arthritis, depression, diabetes, hypertension, hyperlipidemia who presents to the emergency department for depression. According to the patient she was recently taking off her Paxil. States over the past few weeks she has had worsening depression and anxiety with panic attacks. Patient denies any alcohol or drug use. Denies any suicidal or homicidal thoughts. Patient states she cannot be seen by psychiatry until the end of July and she did not think she could wait so she came to the emergency department for evaluation. Patient denies any medical complaints today.  Past Medical History:  Diagnosis Date  . Anxiety   . Arthritis 11/15/2012  . Brain tumor ([place])   . Breast pain   . Depression, controlled   . Diabetes mellitus without complication ([place])   . 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 Fibromyalgia   . Hyperlipidemia   . Hypertension, benign   . Insomnia   . Obesity (BMI 30.0-34.9)   . Pain of both breasts     Patient Active Problem List   Diagnosis Date Noted  . Cannabis use disorder, moderate, dependence ([place]) 10/29/2016  . Bipolar I disorder, most recent episode mixed ([place]) 10/29/2016  . Chronic pain syndrome 03/21/2015  . Hyperlipidemia 11/28/2014  . Vitamin D deficiency 11/28/2014  . Tobacco abuse 05/01/2013    Past Surgical History:  Procedure Laterality Date  . ABDOMINAL HYSTERECTOMY    . ABDOMINAL HYSTERECTOMY  2010  . BILATERAL CARPAL TUNNEL RELEASE    . BREAST BIOPSY Bilateral 1980   EXCISIONAL - NEG  . BREAST BIOPSY Right 1985   CORE W/OUT CLIP - NEG  . BREAST SURGERY     benign breast cyst  . CARPAL TUNNEL RELEASE    . CHOLECYSTECTOMY  2014  . EYE SURGERY     cataract surger both eyes    Prior to Admission medications   Medication Sig Start Date End Date Taking? Authorizing Provider  amantadine (SYMMETREL) 100 MG capsule Take 1 capsule (100 mg total) by mouth 2 (two) times daily. 12/17/16   Elvin So, MD  amLODipine (NORVASC) 10 MG tablet Take 1 tablet (10 mg total) by mouth daily. 11/04/16   [name], MD  diphenhydrAMINE (BENADRYL) 50 MG capsule Take 1 capsule (50 mg total) by mouth at bedtime. 11/05/16   [name], MD  hydrOXYzine (ATARAX/VISTARIL) 10 MG tablet Take 1 tablet (10 mg total) by mouth 3 (three) times daily as needed. 12/17/16   Elvin So, MD  losartan (COZAAR) 50 MG tablet Take 1 tablet (50 mg total) by mouth daily. 11/04/16   [name], MD  paliperidone (INVEGA SUSTENNA) 156 MG/ML SUSP injection Inject 1 mL (156 mg total) into the muscle once. 11/30/16 11/30/16  Rainey Pines, MD  paliperidone (INVEGA SUSTENNA) 156 MG/ML SUSP injection INJECT 1 ML IN THE MUSCLE ONCE 01/17/17   Ravi, Himabindu, MD  paliperidone (INVEGA) 6 MG 24 hr tablet Take 1 tablet (6 mg total) by mouth at bedtime. 12/17/16   Elvin So, MD  traZODone (DESYREL) 50 MG tablet Take 1 tablet (50 mg total) by mouth at bedtime. 11/16/16   Rainey Pines, MD    Allergies  Allergen Reactions  . Aspirin Swelling  . Penicillins Swelling  Family History  Problem Relation Age of Onset  . Heart disease Mother   . Diabetes Mother   . Hypertension Mother   . Diabetes Father   . Hypertension Father   . Hyperlipidemia Sister   . Hyperlipidemia Brother   . Heart disease Brother   . Hyperlipidemia Brother   . Hyperlipidemia Brother   . Drug abuse Brother   . Depression Brother   . Anxiety disorder Brother   . Hyperlipidemia Brother   . Breast cancer Paternal Aunt   . Neuropathy Neg Hx     Social History Social History  Substance Use Topics  . Smoking status: Current Every Day Smoker    Packs/day: 1.00     Years: 20.00    Types: Cigarettes  . Smokeless tobacco: Never Used  . Alcohol use No     Comment: occasional    Review of Systems Constitutional: Negative for fever. Cardiovascular: Negative for chest pain. Respiratory: Negative for shortness of breath. Gastrointestinal: Negative for abdominal pain, vomiting and diarrhea. Genitourinary: Negative for dysuria. Neurological: Negative for headache All other ROS negative  ____________________________________________   PHYSICAL EXAM:  VITAL SIGNS: ED Triage Vitals  Enc Vitals Group     BP 12/24/16 1044 137/71     Pulse Rate 12/24/16 1044 79     Resp 12/24/16 1044 18     Temp 12/24/16 1044 98.6 F (37 C)     Temp Source 12/24/16 1044 Oral     SpO2 12/24/16 1044 98 %     Weight 12/24/16 1045 123 lb (55.8 kg)     Height 12/24/16 1045 5\' 2"  (1.575 m)     Head Circumference --      Peak Flow --      Pain Score --      Pain Loc --      Pain Edu? --      Excl. in [place]? --     Constitutional: Alert and oriented. Well appearing and in no distress. Eyes: Normal exam ENT   Head: Normocephalic and atraumatic   Mouth/Throat: Mucous membranes are moist. Cardiovascular: Normal rate, regular rhythm. No murmur Respiratory: Normal respiratory effort without tachypnea nor retractions. Breath sounds are clear  Gastrointestinal: Soft and nontender. No distention.   Musculoskeletal: Nontender with normal range of motion in all extremities Neurologic:  Normal speech and language. No gross focal neurologic deficits Skin:  Skin is warm, dry and intact.  Psychiatric: Flat affect in the emergency department. Denies SI or HI.  ____________________________________________   INITIAL IMPRESSION / ASSESSMENT AND PLAN / ED COURSE  Pertinent labs & imaging results that were available during my care of the patient were reviewed by me and considered in my medical decision making (see chart for details).  Patient presents to the emergency  department for depression. Denies SI or HI. Patient denies any medical complaints today with a negative review of systems. We will check labs and have psychiatry evaluate. Patient denies any SI or HI, does not meet IVC criteria. Here voluntarily.   Labs are largely negative. Patient has been seen by psychiatry they believe the patient is safe for discharge home with medication adjustments which have been provided to the patient. Patient will be discharged at this time. ____________________________________________   FINAL CLINICAL IMPRESSION(S) / ED DIAGNOSES  Depression    Harvest Dark, MD 12/24/16 1116    Harvest Dark, MD 12/24/16 1228

## 2016-12-24 NOTE — ED Triage Notes (Addendum)
PT arrives to ER via POV with husband c/o depression and anxiety. Pt was recently started on Invega but was discontinued off of her Paxil. PT reports being afraid to be alone, panic attacks. Cannot see psychiatrist until the end of July. Denies SI or HI.     Offered pt interpreter, pt denies.

## 2016-12-24 NOTE — ED Notes (Signed)
Discussed discharge instructions, prescriptions, and follow-up care with patient. No questions or concerns at this time. Pt stable at discharge.  

## 2016-12-24 NOTE — Discharge Instructions (Signed)
You have been seen in the emergency department for a  psychiatric concern. You have been evaluated both medically as well as psychiatrically. Please follow-up with your outpatient resources provided. Return to the emergency department for any worsening symptoms, or any thoughts of hurting yourself or anyone else so that we may attempt to help you. 

## 2016-12-24 NOTE — ED Notes (Signed)
Patient clothing , shoes ,jewlery given to husband  Clifton James

## 2016-12-24 NOTE — Consult Note (Signed)
[facility] Consult   Reason for Consult:  Consult for 61 year old woman with a history of bipolar disorder who comes to the emergency room because of worsening anxiety Referring Physician:  Paduchowski Patient Identification: Alice Simmons MRN:  [id number] Principal Diagnosis: Anxiety disorder Diagnosis:   Patient Active Problem List   Diagnosis Date Noted  . Migraine [G43.909] 12/24/2016  . Anxiety disorder [F41.9] 12/24/2016  . Cannabis use disorder, moderate, dependence ([place]) [F12.20] 10/29/2016  . Bipolar I disorder, most recent episode mixed ([place]) [F31.60] 10/29/2016  . Chronic pain syndrome [G89.4] 03/21/2015  . Hyperlipidemia [E78.5] 11/28/2014  . Vitamin D deficiency [E55.9] 11/28/2014  . Tobacco abuse [Z72.0] 05/01/2013    Total Time spent with patient: 1 hour  Subjective:   Alice Simmons is a 61 y.o. female patient admitted with "I'm really nervous".  HPI:  Patient interviewed. Patient's husband was also interviewed at her request. She states that he can give a better history than she can. She tells me she has been feeling very nervous recently. Husband also repeats that she is feeling anxious. Gets nervous to the point of not feeling like she can leave the house. Feels panicky. He tells me that she has not had a return of any hallucinations or paranoia or bizarre thinking. She has not been talking about hurting herself or displaying any self-harm. Has not been aggressive or violent. The husband is concerned about some of her recent medicine changes particularly in light of how they have had to see multiple providers outside the hospital. He is also concerned that she is on the verge of running out of amantadine and that her Topamax had been discontinued when it used to be important to controlling her headaches.  Social history: Patient lives at home with her husband. They appear to get along well. She does not report any specific new  psychosocial stressor.  Medical history: History of hypertension history of headaches chronic pain and fibromyalgia like pain.  Substance abuse history: No history of alcohol or drug abuse denies any recent substance use problems  Past Psychiatric History: Patient was admitted here to the hospital in May with agitation and confusion thought to be a mixed episode of bipolar disorder. She has other past hospitalizations and has a past episode of cutting herself on the stomach in a fit of what appeared to be psychosis. Patient was stabilized on long-acting Invega during her last hospitalization and taken off antidepressants. Since eating discharged she reportedly has decompensated with her anxiety. Husband reports that over the long haul the anxiety problems and panic attacks had been the more frequent problem for her and that for many years antidepressants, specifically Paxil, had been very helpful without making things worse  Risk to Self: Is patient at risk for suicide?: No Risk to Others:   Prior Inpatient Therapy:   Prior Outpatient Therapy:    Past Medical History:  Past Medical History:  Diagnosis Date  . Anxiety   . Arthritis 11/15/2012  . Brain tumor ([place])   . Breast pain   . Depression, controlled   . Diabetes mellitus without complication ([place])   . 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 Fibromyalgia   . Hyperlipidemia   . Hypertension, benign   . Insomnia   . Obesity (BMI 30.0-34.9)   . Pain of both breasts     Past Surgical History:  Procedure Laterality Date  . ABDOMINAL HYSTERECTOMY    . ABDOMINAL HYSTERECTOMY  2010  . BILATERAL CARPAL TUNNEL RELEASE    .  BREAST BIOPSY Bilateral 1980   EXCISIONAL - NEG  . BREAST BIOPSY Right 1985   CORE W/OUT CLIP - NEG  . BREAST SURGERY     benign breast cyst  . CARPAL TUNNEL RELEASE    . CHOLECYSTECTOMY  2014  . EYE SURGERY     cataract surger both eyes   Family History:  Family History  Problem Relation Age of Onset  . Heart  disease Mother   . Diabetes Mother   . Hypertension Mother   . Diabetes Father   . Hypertension Father   . Hyperlipidemia Sister   . Hyperlipidemia Brother   . Heart disease Brother   . Hyperlipidemia Brother   . Hyperlipidemia Brother   . Drug abuse Brother   . Depression Brother   . Anxiety disorder Brother   . Hyperlipidemia Brother   . Breast cancer Paternal Aunt   . Neuropathy Neg Hx    Family Psychiatric  History: None identified Social History:  History  Alcohol Use No    Comment: occasional     History  Drug Use  . Types: Marijuana    Comment: smokes MJ 2 times per day    Social History   Social History  . Marital status: Married    Spouse name: Hessie Diener  . Number of children: 2  . Years of education: 12   Occupational History  . Unemployed    Social History Main Topics  . Smoking status: Current Every Day Smoker    Packs/day: 1.00    Years: 20.00    Types: Cigarettes  . Smokeless tobacco: Never Used  . Alcohol use No     Comment: occasional  . Drug use: Yes    Types: Marijuana     Comment: smokes MJ 2 times per day  . Sexual activity: Not Currently   Other Topics Concern  . None   Social History Narrative   Lives alone   Caffeine use: Drinks coffee (3 cups per day)   ** Merged History Encounter **       Additional Social History:    Allergies:   Allergies  Allergen Reactions  . Aspirin Swelling  . Penicillins Swelling    Labs:  Results for orders placed or performed during the hospital encounter of 12/24/16 (from the past 48 hour(s))  Comprehensive metabolic panel     Status: Abnormal   Collection Time: 12/24/16 10:50 AM  Result Value Ref Range   Sodium 142 135 - 145 mmol/L   Potassium 4.3 3.5 - 5.1 mmol/L   Chloride 105 101 - 111 mmol/L   CO2 28 22 - 32 mmol/L   Glucose, Bld 111 (H) 65 - 99 mg/dL   BUN 16 6 - 20 mg/dL   Creatinine, Ser 0.60 0.44 - 1.00 mg/dL   Calcium 9.6 8.9 - 10.3 mg/dL   Total Protein 7.1 6.5 -  8.1 g/dL   Albumin 4.0 3.5 - 5.0 g/dL   AST 19 15 - 41 U/L   ALT 15 14 - 54 U/L   Alkaline Phosphatase 68 38 - 126 U/L   Total Bilirubin 0.6 0.3 - 1.2 mg/dL   GFR calc non Af Amer >60 >60 mL/min   GFR calc Af Amer >60 >60 mL/min    Comment: (NOTE) The eGFR has been calculated using the CKD EPI equation. This calculation has not been validated in all clinical situations. eGFR's persistently <60 mL/min signify possible Chronic Kidney Disease.    Anion gap 9 5 -  15  Ethanol     Status: None   Collection Time: 12/24/16 10:50 AM  Result Value Ref Range   Alcohol, Ethyl (B) <5 <5 mg/dL    Comment:        LOWEST DETECTABLE LIMIT FOR SERUM ALCOHOL IS 5 mg/dL FOR MEDICAL PURPOSES ONLY   Salicylate level     Status: None   Collection Time: 12/24/16 10:50 AM  Result Value Ref Range   Salicylate Lvl <1.7 2.8 - 30.0 mg/dL  Acetaminophen level     Status: Abnormal   Collection Time: 12/24/16 10:50 AM  Result Value Ref Range   Acetaminophen (Tylenol), Serum <10 (L) 10 - 30 ug/mL    Comment:        THERAPEUTIC CONCENTRATIONS VARY SIGNIFICANTLY. A RANGE OF 10-30 ug/mL MAY BE AN EFFECTIVE CONCENTRATION FOR MANY PATIENTS. HOWEVER, SOME ARE BEST TREATED AT CONCENTRATIONS OUTSIDE THIS RANGE. ACETAMINOPHEN CONCENTRATIONS >150 ug/mL AT 4 HOURS AFTER INGESTION AND >50 ug/mL AT 12 HOURS AFTER INGESTION ARE OFTEN ASSOCIATED WITH TOXIC REACTIONS.   cbc     Status: None   Collection Time: 12/24/16 10:50 AM  Result Value Ref Range   WBC 5.8 3.6 - 11.0 K/uL   RBC 4.34 3.80 - 5.20 MIL/uL   Hemoglobin 13.8 12.0 - 16.0 g/dL   HCT 39.9 35.0 - 47.0 %   MCV 91.8 80.0 - 100.0 fL   MCH 31.7 26.0 - 34.0 pg   MCHC 34.5 32.0 - 36.0 g/dL   RDW 13.6 11.5 - 14.5 %   Platelets 361 150 - 440 K/uL  Urine Drug Screen, Qualitative     Status: Abnormal   Collection Time: 12/24/16 10:52 AM  Result Value Ref Range   Tricyclic, Ur Screen NONE DETECTED NONE DETECTED   Amphetamines, Ur Screen NONE DETECTED  NONE DETECTED   MDMA (Ecstasy)Ur Screen NONE DETECTED NONE DETECTED   Cocaine Metabolite,Ur [place] NONE DETECTED NONE DETECTED   Opiate, Ur Screen NONE DETECTED NONE DETECTED   Phencyclidine (PCP) Ur S NONE DETECTED NONE DETECTED   Cannabinoid 50 Ng, Ur [place] POSITIVE (A) NONE DETECTED   Barbiturates, Ur Screen NONE DETECTED NONE DETECTED   Benzodiazepine, Ur Scrn NONE DETECTED NONE DETECTED   Methadone Scn, Ur NONE DETECTED NONE DETECTED    Comment: (NOTE) 510  Tricyclics, urine               Cutoff 1000 ng/mL 200  Amphetamines, urine             Cutoff 1000 ng/mL 300  MDMA (Ecstasy), urine           Cutoff 500 ng/mL 400  Cocaine Metabolite, urine       Cutoff 300 ng/mL 500  Opiate, urine                   Cutoff 300 ng/mL 600  Phencyclidine (PCP), urine      Cutoff 25 ng/mL 700  Cannabinoid, urine              Cutoff 50 ng/mL 800  Barbiturates, urine             Cutoff 200 ng/mL 900  Benzodiazepine, urine           Cutoff 200 ng/mL 1000 Methadone, urine                Cutoff 300 ng/mL 1100 1200 The urine drug screen provides only a preliminary, unconfirmed 1300 analytical test result and should not be used for  non-medical 1400 purposes. Clinical consideration and professional judgment should 1500 be applied to any positive drug screen result due to possible 1600 interfering substances. A more specific alternate chemical method 1700 must be used in order to obtain a confirmed analytical result.  1800 Gas chromato graphy / mass spectrometry (GC/MS) is the preferred 1900 confirmatory method.     No current facility-administered medications for this encounter.    Current Outpatient Prescriptions  Medication Sig Dispense Refill  . amantadine (SYMMETREL) 100 MG capsule Take 1 capsule (100 mg total) by mouth 2 (two) times daily. 60 capsule 0  . amLODipine (NORVASC) 10 MG tablet Take 1 tablet (10 mg total) by mouth daily. 30 tablet 0  . diphenhydrAMINE (BENADRYL) 50 MG capsule Take 1  capsule (50 mg total) by mouth at bedtime. 30 capsule 0  . hydrOXYzine (ATARAX/VISTARIL) 10 MG tablet Take 1 tablet (10 mg total) by mouth 3 (three) times daily as needed. 30 tablet 0  . losartan (COZAAR) 50 MG tablet Take 1 tablet (50 mg total) by mouth daily. 30 tablet 0  . paliperidone (INVEGA SUSTENNA) 156 MG/ML SUSP injection Inject 1 mL (156 mg total) into the muscle once. 1 mL 0  . [START ON 01/17/2017] paliperidone (INVEGA SUSTENNA) 156 MG/ML SUSP injection INJECT 1 ML IN THE MUSCLE ONCE 1 mL 0  . paliperidone (INVEGA) 6 MG 24 hr tablet Take 1 tablet (6 mg total) by mouth at bedtime. 30 tablet 0  . PARoxetine (PAXIL) 20 MG tablet Take 1 tablet (20 mg total) by mouth 2 (two) times daily. 60 tablet 0  . topiramate (TOPAMAX) 100 MG tablet Take 1 tablet (100 mg total) by mouth at bedtime. 30 tablet 0  . traZODone (DESYREL) 50 MG tablet Take 1 tablet (50 mg total) by mouth at bedtime. 30 tablet 0    Musculoskeletal: Strength & Muscle Tone: within normal limits Gait & Station: normal Patient leans: N/A  Psychiatric Specialty Exam: Physical Exam  Nursing note and vitals reviewed. Constitutional: She appears well-developed and well-nourished.  HENT:  Head: Normocephalic and atraumatic.  Eyes: Pupils are equal, round, and reactive to light. Conjunctivae are normal.  Neck: Normal range of motion.  Cardiovascular: Regular rhythm and normal heart sounds.   Respiratory: Effort normal. No respiratory distress.  GI: Soft.  Musculoskeletal: Normal range of motion.  Neurological: She is alert.  Skin: Skin is warm and dry.  Psychiatric: Judgment normal. Her mood appears anxious. Her speech is delayed. She is slowed. Thought content is not paranoid. She does not express impulsivity. She expresses no homicidal and no suicidal ideation. She exhibits abnormal recent memory.    Review of Systems  Constitutional: Negative.   HENT: Negative.   Eyes: Negative.   Respiratory: Negative.    Cardiovascular: Negative.   Gastrointestinal: Negative.   Musculoskeletal: Negative.   Skin: Negative.   Neurological: Negative.   Psychiatric/Behavioral: Negative for depression, hallucinations, memory loss, substance abuse and suicidal ideas. The patient is nervous/anxious and has insomnia.     Blood pressure 137/71, pulse 79, temperature 98.6 F (37 C), temperature source Oral, resp. rate 18, height _0  (1.575 m), weight 55.8 kg (123 lb), SpO2 98 %.Body mass index is 22.5 kg/m.  General Appearance: Casual  Eye Contact:  Good  Speech:  Slow  Volume:  Decreased  Mood:  Anxious  Affect:  Congruent  Thought Process:  Goal Directed  Orientation:  Full (Time, Place, and Person)  Thought Content:  Tangential  Suicidal Thoughts:  No  Homicidal Thoughts:  No  Memory:  Immediate;   Fair Recent;   Fair Remote;   Fair  Judgement:  Fair  Insight:  Fair  Psychomotor Activity:  Decreased  Concentration:  Concentration: Fair  Recall:  [employer] of Knowledge:  Fair  Language:  Fair  Akathisia:  No  Handed:  Right  AIMS (if indicated):     Assets:  Desire for Improvement Financial Resources/Insurance Housing Physical Health Resilience Social Support  ADL's:  Intact  Cognition:  Impaired,  Mild  Sleep:        Treatment Plan Summary: Medication management and Plan See note below note below  Disposition: Medication management and Plan I spoke with the patient and with her husband. Patient made it clear that she did not want to have an interpreter but just wanted me to communicate with her husband. I advised them that restarting the Paxil could theoretically cause more mood swings or worsening of the bipolar disorder. Patient both stated that was a risk they were willing to take given its effectiveness in controlling her anxiety previously and requested that it be restarted. This seems like the safer option than adding a new medicine or a benzodiazepine. Patient does not need  hospital treatment at this point but does seem to be having symptoms that are causing distress. I agreed to put in refills of the amantadine and the Invvega pills and also to restart the topiramate 100 mg at night. They have an appointment to follow-up with [facility] on July 31. Both are aware that if symptoms worsen particularly with any sign of any dangerousness they are to immediately return to the emergency room.  Alethia Berthold, MD 12/24/2016 12:21 PM

## 2016-12-25 ENCOUNTER — Ambulatory Visit (INDEPENDENT_AMBULATORY_CARE_PROVIDER_SITE_OTHER): Payer: Medicare Other | Admitting: Family Medicine

## 2016-12-25 ENCOUNTER — Encounter: Payer: Self-pay | Admitting: Family Medicine

## 2016-12-25 DIAGNOSIS — G43909 Migraine, unspecified, not intractable, without status migrainosus: Secondary | ICD-10-CM | POA: Diagnosis not present

## 2016-12-25 DIAGNOSIS — F1220 Cannabis dependence, uncomplicated: Secondary | ICD-10-CM | POA: Diagnosis not present

## 2016-12-25 DIAGNOSIS — J3089 Other allergic rhinitis: Secondary | ICD-10-CM

## 2016-12-25 DIAGNOSIS — E782 Mixed hyperlipidemia: Secondary | ICD-10-CM

## 2016-12-25 DIAGNOSIS — J309 Allergic rhinitis, unspecified: Secondary | ICD-10-CM | POA: Insufficient documentation

## 2016-12-25 DIAGNOSIS — F3160 Bipolar disorder, current episode mixed, unspecified: Secondary | ICD-10-CM

## 2016-12-25 MED ORDER — FLUTICASONE PROPIONATE 50 MCG/ACT NA SUSP: 2.0000 | Freq: Every day | NASAL | 6 refills | Status: DC

## 2016-12-25 NOTE — Assessment & Plan Note (Signed)
Start nasal corticosteroid 

## 2016-12-25 NOTE — Progress Notes (Signed)
BP 126/74   Pulse 99   Temp 98.1 F (36.7 C) (Oral)   Resp 16   Wt 123 lb 8 oz (56 kg)   SpO2 97%   BMI 22.59 kg/m    Subjective:    Patient ID: Alice Simmons, female    DOB: [birth year], 61 y.o.   MRN: [id number]  HPI: Alice Simmons is a 61 y.o. female  Chief Complaint  Patient presents with  . headaches    allergies eye redness    HPI Went to the ER, saw psychiatrist yesterday; they stopped the topamax when she was in the hospital, but yesterday they started that back; that was helping for headaches; started back last night; also starting back on paxil  She has watery itchy eyes; runny nose, itching skin; evening mostly Ran through lots of ideas; no pets, changing filters, etc.; nothing  She is still having headaches; sometimes she wakes up; head is poudning, mornings; worse with movement; both sides; ears ringing; not clenching in her sleep  Head CT from Oct 23, 2016 ---------------------------------------------------- CLINICAL DATA:  Increasing headache  EXAM: CT HEAD WITHOUT AND WITH CONTRAST  TECHNIQUE: Contiguous axial images were obtained from the base of the skull through the vertex without and with intravenous contrast  CONTRAST:  22mL ISOVUE-300 IOPAMIDOL (ISOVUE-300) INJECTION 61%  COMPARISON:  MRI head 09/07/2014  FINDINGS: Brain: No evidence of acute infarction, hemorrhage, hydrocephalus, extra-axial collection or mass lesion/mass effect. Normal enhancement postcontrast administration.  Vascular: Negative for hyperdense vessel. Normal arterial and venous enhancement.  Skull: Negative  Sinuses/Orbits: Negative  Other: None  IMPRESSION: Negative CT head with contrast.   Electronically Signed   By: Franchot Gallo M.D.   On: 10/23/2016 10:02  --------------------------------------------------- High cholesterol; really came down, LDL dropped from 157 to 106 with diet; stopped the pills, doing it with  diet  Depression screen [facility] 2/9 12/25/2016 10/08/2016 06/03/2016 11/12/2015 08/14/2015  Decreased Interest 0 1 0 0 0  Down, Depressed, Hopeless 1 1 0 0 1  PHQ - 2 Score 1 2 0 0 1  Altered sleeping - 1 - - -  Tired, decreased energy - 1 - - -  Change in appetite - 1 - - -  Feeling bad or failure about yourself  - 1 - - -  Trouble concentrating - 1 - - -  Moving slowly or fidgety/restless - 1 - - -  Suicidal thoughts - 0 - - -  PHQ-9 Score - 8 - - -  Difficult doing work/chores - Not difficult at all - - -  Some encounter information is confidential and restricted. Go to Review Flowsheets activity to see all data.    Relevant past medical, surgical, family and social history reviewed Past Medical History:  Diagnosis Date  . Anxiety   . Arthritis 11/15/2012  . Brain tumor ([place])   . Breast pain   . Depression, controlled   . Diabetes mellitus without complication ([place])   . 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 Fibromyalgia   . Hyperlipidemia   . Hypertension, benign   . Insomnia   . Obesity (BMI 30.0-34.9)   . Pain of both breasts    Past Surgical History:  Procedure Laterality Date  . ABDOMINAL HYSTERECTOMY    . ABDOMINAL HYSTERECTOMY  2010  . BILATERAL CARPAL TUNNEL RELEASE    . BREAST BIOPSY Bilateral 1980   EXCISIONAL - NEG  . BREAST BIOPSY Right 1985   CORE W/OUT CLIP - NEG  .  BREAST SURGERY     benign breast cyst  . CARPAL TUNNEL RELEASE    . CHOLECYSTECTOMY  2014  . EYE SURGERY     cataract surger both eyes   Family History  Problem Relation Age of Onset  . Heart disease Mother   . Diabetes Mother   . Hypertension Mother   . Diabetes Father   . Hypertension Father   . Hyperlipidemia Sister   . Hyperlipidemia Brother   . Heart disease Brother   . Hyperlipidemia Brother   . Hyperlipidemia Brother   . Drug abuse Brother   . Depression Brother   . Anxiety disorder Brother   . Hyperlipidemia Brother   . Breast cancer Paternal Aunt   . Neuropathy Neg Hx     Social History   Social History  . Marital status: Married    Spouse name: Hessie Diener  . Number of children: 2  . Years of education: 12   Occupational History  . Unemployed    Social History Main Topics  . Smoking status: Current Every Day Smoker    Packs/day: 1.00    Years: 20.00    Types: Cigarettes  . Smokeless tobacco: Never Used  . Alcohol use No  . Drug use: Yes    Types: Marijuana     Comment: smokes MJ 2 times per day  . Sexual activity: Not Currently   Other Topics Concern  . Not on file   Social History Narrative   Lives alone   Caffeine use: Drinks coffee (3 cups per day)   ** Merged History Encounter **       Interim medical history since last visit reviewed. Allergies and medications reviewed  Review of Systems Per HPI unless specifically indicated above     Objective:    BP 126/74   Pulse 99   Temp 98.1 F (36.7 C) (Oral)   Resp 16   Wt 123 lb 8 oz (56 kg)   SpO2 97%   BMI 22.59 kg/m   Wt Readings from Last 3 Encounters:  12/25/16 123 lb 8 oz (56 kg)  12/24/16 123 lb (55.8 kg)  10/27/16 121 lb (54.9 kg)    Physical Exam  Constitutional: She appears well-developed and well-nourished. No distress.  HENT:  Head: Normocephalic and atraumatic.  Eyes: EOM are normal. No scleral icterus.  Neck: No thyromegaly present.  Cardiovascular: Normal rate, regular rhythm and normal heart sounds.   No murmur heard. Pulmonary/Chest: Effort normal and breath sounds normal. No respiratory distress. She has no wheezes.  Musculoskeletal: Normal range of motion. She exhibits no edema.  Neurological: She is alert. She exhibits normal muscle tone.  Skin: Skin is warm and dry. She is not diaphoretic. No pallor.  Psychiatric: She has a normal mood and affect. Her behavior is normal. Judgment and thought content normal.   Results for orders placed or performed during the hospital encounter of 12/24/16  Comprehensive metabolic panel  Result Value Ref  Range   Sodium 142 135 - 145 mmol/L   Potassium 4.3 3.5 - 5.1 mmol/L   Chloride 105 101 - 111 mmol/L   CO2 28 22 - 32 mmol/L   Glucose, Bld 111 (H) 65 - 99 mg/dL   BUN 16 6 - 20 mg/dL   Creatinine, Ser 0.60 0.44 - 1.00 mg/dL   Calcium 9.6 8.9 - 10.3 mg/dL   Total Protein 7.1 6.5 - 8.1 g/dL   Albumin 4.0 3.5 - 5.0 g/dL   AST 19 15 -  41 U/L   ALT 15 14 - 54 U/L   Alkaline Phosphatase 68 38 - 126 U/L   Total Bilirubin 0.6 0.3 - 1.2 mg/dL   GFR calc non Af Amer >60 >60 mL/min   GFR calc Af Amer >60 >60 mL/min   Anion gap 9 5 - 15  Ethanol  Result Value Ref Range   Alcohol, Ethyl (B) <5 <5 mg/dL  Salicylate level  Result Value Ref Range   Salicylate Lvl <0.1 2.8 - 30.0 mg/dL  Acetaminophen level  Result Value Ref Range   Acetaminophen (Tylenol), Serum <10 (L) 10 - 30 ug/mL  cbc  Result Value Ref Range   WBC 5.8 3.6 - 11.0 K/uL   RBC 4.34 3.80 - 5.20 MIL/uL   Hemoglobin 13.8 12.0 - 16.0 g/dL   HCT 39.9 35.0 - 47.0 %   MCV 91.8 80.0 - 100.0 fL   MCH 31.7 26.0 - 34.0 pg   MCHC 34.5 32.0 - 36.0 g/dL   RDW 13.6 11.5 - 14.5 %   Platelets 361 150 - 440 K/uL  Urine Drug Screen, Qualitative  Result Value Ref Range   Tricyclic, Ur Screen NONE DETECTED NONE DETECTED   Amphetamines, Ur Screen NONE DETECTED NONE DETECTED   MDMA (Ecstasy)Ur Screen NONE DETECTED NONE DETECTED   Cocaine Metabolite,Ur [place] NONE DETECTED NONE DETECTED   Opiate, Ur Screen NONE DETECTED NONE DETECTED   Phencyclidine (PCP) Ur S NONE DETECTED NONE DETECTED   Cannabinoid 50 Ng, Ur [place] POSITIVE (A) NONE DETECTED   Barbiturates, Ur Screen NONE DETECTED NONE DETECTED   Benzodiazepine, Ur Scrn NONE DETECTED NONE DETECTED   Methadone Scn, Ur NONE DETECTED NONE DETECTED      Assessment & Plan:   Problem List Items Addressed This Visit      Cardiovascular and Mediastinum   Migraine    Recommended magnesium; start back on topamax        Respiratory   Allergic rhinitis    Start nasal corticosteroid         Other   Hyperlipidemia (Chronic)    Praise given for her healthy eating habits; check lipids in Nov      Cannabis use disorder, moderate, dependence ([place])    I told patient to be aware that drugs can be laced      Bipolar I disorder, most recent episode mixed ([place])    Doing much better with the Invega; back on medicine per psychiatrist          Follow up plan: Return in about 4 months (around 05/03/2017) for twenty minute follow-up with fasting labs.  An after-visit summary was printed and given to the patient at [facility].  Please see the patient instructions which may contain other information and recommendations beyond what is mentioned above in the assessment and plan.  Meds ordered this encounter  Medications  . fluticasone (FLONASE) 50 MCG/ACT nasal spray    Sig: Place 2 sprays into both nostrils daily.    Dispense:  16 g    Refill:  6    No orders of the defined types were placed in this encounter.

## 2016-12-25 NOTE — Assessment & Plan Note (Signed)
Praise given for her healthy eating habits; check lipids in Nov

## 2016-12-25 NOTE — Assessment & Plan Note (Signed)
I told patient to be aware that drugs can be laced

## 2016-12-25 NOTE — Assessment & Plan Note (Signed)
Doing much better with the Invega; back on medicine per psychiatrist

## 2016-12-25 NOTE — Assessment & Plan Note (Signed)
Recommended magnesium; start back on topamax

## 2016-12-25 NOTE — Patient Instructions (Addendum)
Consider taking magnesium 250 mg daily for headaches Think about doing yoga at home to help relax I am so proud of your dietary efforts with your cholesterol Return in November for next visit and fasting labs Try to cut off all electronics for two hours before bed

## 2017-01-12 DIAGNOSIS — Z79899 Other long term (current) drug therapy: Secondary | ICD-10-CM | POA: Diagnosis not present

## 2017-01-16 ENCOUNTER — Other Ambulatory Visit: Payer: Self-pay | Admitting: Family Medicine

## 2017-01-22 ENCOUNTER — Encounter: Payer: Self-pay | Admitting: Family Medicine

## 2017-01-22 ENCOUNTER — Ambulatory Visit (INDEPENDENT_AMBULATORY_CARE_PROVIDER_SITE_OTHER): Payer: Medicare Other | Admitting: Family Medicine

## 2017-01-22 DIAGNOSIS — I10 Essential (primary) hypertension: Secondary | ICD-10-CM | POA: Diagnosis not present

## 2017-01-22 DIAGNOSIS — M797 Fibromyalgia: Secondary | ICD-10-CM | POA: Diagnosis not present

## 2017-01-22 DIAGNOSIS — F3160 Bipolar disorder, current episode mixed, unspecified: Secondary | ICD-10-CM | POA: Diagnosis not present

## 2017-01-22 MED ORDER — AMLODIPINE BESYLATE 5 MG PO TABS: 5.0000 mg | ORAL_TABLET | Freq: Every day | ORAL | 3 refills | Status: DC

## 2017-01-22 MED ORDER — POLYETHYLENE GLYCOL 3350 17 GM/SCOOP PO POWD: 17.0000 g | Freq: Every day | ORAL | 1 refills | Status: AC

## 2017-01-22 MED ORDER — GABAPENTIN 300 MG PO CAPS: ORAL_CAPSULE | ORAL | 0 refills | Status: DC

## 2017-01-22 NOTE — Patient Instructions (Signed)
Let's start back on the gabapentin Increase by 300 mg every THREE days until you get up to 300 mg in the morning, 300 mg in the afternoon, and 600 mg at night Reduce your amlodipine from 10 mg daily to 5 mg daily Monitor blood pressure at home and let me know if going up too high (over 140 on top)

## 2017-01-22 NOTE — Progress Notes (Signed)
BP (!) 102/58   Pulse 81   Temp 98.6 F (37 C) (Oral)   Resp 16   Wt 126 lb 12.8 oz (57.5 kg)   SpO2 98%   BMI 23.19 kg/m    Subjective:    Patient ID: Alice Simmons, female    DOB: 1956/06/12, 61 y.o.   MRN: [id number]  HPI: Alice Simmons is a 61 y.o. female  Chief Complaint  Patient presents with  . Medication Refill    they want to see about getting gabapentnin back  . Follow-up    HPI She has seen the new psychiatrist, Dr. Carlos Levering Su She is here with her husband today He changed the paxil from BID, gave lexapro in the morning Decreased the shot in half Started back on the topmax He thought the gabapentin would be okay to start back for fibromyalgia Bothers her the most when outside Afraid to be alone; afraid to drive  Depression screen [facility] 2/9 01/22/2017 12/25/2016 10/08/2016 06/03/2016 11/12/2015  Decreased Interest 0 0 1 0 0  Down, Depressed, Hopeless 1 1 1  0 0  PHQ - 2 Score 1 1 2  0 0  Altered sleeping - - 1 - -  Tired, decreased energy - - 1 - -  Change in appetite - - 1 - -  Feeling bad or failure about yourself  - - 1 - -  Trouble concentrating - - 1 - -  Moving slowly or fidgety/restless - - 1 - -  Suicidal thoughts - - 0 - -  PHQ-9 Score - - 8 - -  Difficult doing work/chores - - Not difficult at all - -  Some encounter information is confidential and restricted. Go to Review Flowsheets activity to see all data.    Relevant past medical, surgical, family and social history reviewed Past Medical History:  Diagnosis Date  . Anxiety   . Arthritis 11/15/2012  . Brain tumor ([place])   . Breast pain   . Depression, controlled   . Diabetes mellitus without complication ([place])   . 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 Fibromyalgia   . Hyperlipidemia   . Hypertension, benign   . Insomnia   . Obesity (BMI 30.0-34.9)   . Pain of both breasts    Past Surgical History:  Procedure Laterality Date  . ABDOMINAL HYSTERECTOMY    . ABDOMINAL  HYSTERECTOMY  2010  . BILATERAL CARPAL TUNNEL RELEASE    . BREAST BIOPSY Bilateral 1980   EXCISIONAL - NEG  . BREAST BIOPSY Right 1985   CORE W/OUT CLIP - NEG  . BREAST SURGERY     benign breast cyst  . CARPAL TUNNEL RELEASE    . CHOLECYSTECTOMY  2014  . EYE SURGERY     cataract surger both eyes   Family History  Problem Relation Age of Onset  . Heart disease Mother   . Diabetes Mother   . Hypertension Mother   . Diabetes Father   . Hypertension Father   . Hyperlipidemia Sister   . Hyperlipidemia Brother   . Heart disease Brother   . Hyperlipidemia Brother   . Hyperlipidemia Brother   . Drug abuse Brother   . Depression Brother   . Anxiety disorder Brother   . Hyperlipidemia Brother   . Breast cancer Paternal Aunt   . Neuropathy Neg Hx    Social History   Social History  . Marital status: Married    Spouse name: Hessie Diener  . Number of children: 2  .  Years of education: 54   Occupational History  . Unemployed    Social History Main Topics  . Smoking status: Former Smoker    Packs/day: 1.00    Years: 20.00    Types: Cigarettes  . Smokeless tobacco: Never Used  . Alcohol use No  . Drug use: No     Comment: smokes MJ 2 times per day  . Sexual activity: Not Currently   Other Topics Concern  . Not on file   Social History Narrative      Caffeine use: Drinks coffee (3 cups per day)   ** Merged History Encounter **       Interim medical history since last visit reviewed. Allergies and medications reviewed  Review of Systems Per HPI unless specifically indicated above     Objective:    BP (!) 102/58   Pulse 81   Temp 98.6 F (37 C) (Oral)   Resp 16   Wt 126 lb 12.8 oz (57.5 kg)   SpO2 98%   BMI 23.19 kg/m   Wt Readings from Last 3 Encounters:  01/22/17 126 lb 12.8 oz (57.5 kg)  12/25/16 123 lb 8 oz (56 kg)  12/24/16 123 lb (55.8 kg)    Physical Exam  Constitutional: She appears well-developed and well-nourished. No distress.  Eyes:  EOM are normal. No scleral icterus.  Cardiovascular: Normal rate, regular rhythm and normal heart sounds.   No murmur heard. Pulmonary/Chest: Effort normal and breath sounds normal.  Musculoskeletal: Normal range of motion.  Neurological: She is alert.  Skin: No pallor.  Psychiatric: She has a normal mood and affect.      Assessment & Plan:   Problem List Items Addressed This Visit      Cardiovascular and Mediastinum   Benign hypertension    Reduce CCB from 10 mg daily to 5 mg daily; call if needed      Relevant Medications   amLODipine (NORVASC) 5 MG tablet     Other   Fibromyalgia    Will start back on lower dose gabapentin, as the psychiatrist prefers I manage and prescribe this per patient and her husband; Rx sent; titrate up by one pill every 3 days; will have her contact if needed      Bipolar I disorder, most recent episode mixed [facility])    Managed by psychiatrist; glad she is doing better; she is much calmer          Follow up plan: Return in about 3 months (around 04/24/2017) for twenty minute follow-up with fasting labs.  An after-visit summary was printed and given to the patient at [place].  Please see the patient instructions which may contain other information and recommendations beyond what is mentioned above in the assessment and plan.  Meds ordered this encounter  Medications  . escitalopram (LEXAPRO) 10 MG tablet    Sig: Take 10 mg by mouth daily.    Refill:  3  . gabapentin (NEURONTIN) 300 MG capsule    Sig: One by mouth at bedtime x 3 nights, then one twice a day for 3 days, then one three times a day for 3 days, then one in the morning, one in the afternoon, and two at night    Dispense:  90 capsule    Refill:  0  . Paliperidone Palmitate (INVEGA SUSTENNA) 117 MG/0.75ML SUSP    Sig: Inject 117 mg into the muscle every 30 (thirty) days.    Dispense:  0.75 mL    Refill:  0  . PARoxetine (PAXIL) 20 MG tablet    Sig: Take 1 tablet (20 mg total) by  mouth at bedtime.  Marland Kitchen amantadine (SYMMETREL) 100 MG capsule    Sig: Take 1 capsule (100 mg total) by mouth 2 (two) times daily.  Marland Kitchen amLODipine (NORVASC) 5 MG tablet    Sig: Take 1 tablet (5 mg total) by mouth daily.    Dispense:  90 tablet    Refill:  3  . polyethylene glycol powder (GLYCOLAX/MIRALAX) powder    Sig: Take 17 g by mouth daily. Mix in water and drink to keep bowels soft    Dispense:  3350 g    Refill:  1    No orders of the defined types were placed in this encounter.

## 2017-01-27 DIAGNOSIS — I10 Essential (primary) hypertension: Secondary | ICD-10-CM | POA: Insufficient documentation

## 2017-01-27 DIAGNOSIS — M797 Fibromyalgia: Secondary | ICD-10-CM | POA: Insufficient documentation

## 2017-01-27 NOTE — Assessment & Plan Note (Signed)
Will start back on lower dose gabapentin, as the psychiatrist prefers I manage and prescribe this per patient and her husband; Rx sent; titrate up by one pill every 3 days; will have her contact if needed

## 2017-01-27 NOTE — Assessment & Plan Note (Signed)
Managed by psychiatrist; glad she is doing better; she is much calmer

## 2017-01-27 NOTE — Assessment & Plan Note (Signed)
Reduce CCB from 10 mg daily to 5 mg daily; call if needed

## 2017-02-01 ENCOUNTER — Other Ambulatory Visit: Payer: Self-pay | Admitting: Family Medicine

## 2017-02-01 NOTE — Telephone Encounter (Signed)
Last Cr and K+ reviewed from July 2018; Rx approved

## 2017-02-18 ENCOUNTER — Other Ambulatory Visit: Payer: Self-pay | Admitting: Family Medicine

## 2017-03-02 DIAGNOSIS — R220 Localized swelling, mass and lump, head: Secondary | ICD-10-CM | POA: Diagnosis not present

## 2017-03-02 DIAGNOSIS — H47092 Other disorders of optic nerve, not elsewhere classified, left eye: Secondary | ICD-10-CM | POA: Diagnosis not present

## 2017-03-02 DIAGNOSIS — D329 Benign neoplasm of meninges, unspecified: Secondary | ICD-10-CM | POA: Diagnosis not present

## 2017-03-16 ENCOUNTER — Other Ambulatory Visit: Payer: Self-pay | Admitting: Family Medicine

## 2017-03-22 ENCOUNTER — Other Ambulatory Visit: Payer: Self-pay

## 2017-03-22 MED ORDER — GABAPENTIN 300 MG PO CAPS: ORAL_CAPSULE | ORAL | 2 refills | Status: DC

## 2017-03-25 DIAGNOSIS — H02413 Mechanical ptosis of bilateral eyelids: Secondary | ICD-10-CM | POA: Diagnosis not present

## 2017-03-25 DIAGNOSIS — H02423 Myogenic ptosis of bilateral eyelids: Secondary | ICD-10-CM | POA: Diagnosis not present

## 2017-04-27 ENCOUNTER — Ambulatory Visit: Payer: Self-pay | Admitting: Family Medicine

## 2017-05-12 ENCOUNTER — Encounter: Payer: Self-pay | Admitting: Family Medicine

## 2017-05-12 ENCOUNTER — Ambulatory Visit (INDEPENDENT_AMBULATORY_CARE_PROVIDER_SITE_OTHER): Payer: Medicare Other | Admitting: Family Medicine

## 2017-05-12 VITALS — BP 116/64 | HR 66 | Temp 98.2°F | Resp 16 | Wt 155.6 lb

## 2017-05-12 DIAGNOSIS — G43909 Migraine, unspecified, not intractable, without status migrainosus: Secondary | ICD-10-CM | POA: Diagnosis not present

## 2017-05-12 DIAGNOSIS — E559 Vitamin D deficiency, unspecified: Secondary | ICD-10-CM | POA: Diagnosis not present

## 2017-05-12 DIAGNOSIS — E782 Mixed hyperlipidemia: Secondary | ICD-10-CM

## 2017-05-12 DIAGNOSIS — R7303 Prediabetes: Secondary | ICD-10-CM

## 2017-05-12 DIAGNOSIS — R635 Abnormal weight gain: Secondary | ICD-10-CM | POA: Diagnosis not present

## 2017-05-12 DIAGNOSIS — Z23 Encounter for immunization: Secondary | ICD-10-CM | POA: Diagnosis not present

## 2017-05-12 DIAGNOSIS — Z720 Tobacco use: Secondary | ICD-10-CM

## 2017-05-12 DIAGNOSIS — F3160 Bipolar disorder, current episode mixed, unspecified: Secondary | ICD-10-CM

## 2017-05-12 DIAGNOSIS — Z5181 Encounter for therapeutic drug level monitoring: Secondary | ICD-10-CM | POA: Diagnosis not present

## 2017-05-12 DIAGNOSIS — I10 Essential (primary) hypertension: Secondary | ICD-10-CM | POA: Diagnosis not present

## 2017-05-12 DIAGNOSIS — M797 Fibromyalgia: Secondary | ICD-10-CM | POA: Diagnosis not present

## 2017-05-12 NOTE — Assessment & Plan Note (Signed)
Check lipids today; glad she is watching her diet

## 2017-05-12 NOTE — Assessment & Plan Note (Signed)
Encouraged her to quit entirely

## 2017-05-12 NOTE — Assessment & Plan Note (Signed)
Check level 

## 2017-05-12 NOTE — Assessment & Plan Note (Signed)
Will decrease the amlodipine to 2.5 mg daily; as long as pressures remain under 130-140, continue that strength; if pressures creep up over 140, go back to 5 mg daily

## 2017-05-12 NOTE — Assessment & Plan Note (Signed)
Check TSH, but likely related to medicine

## 2017-05-12 NOTE — Progress Notes (Signed)
BP 116/64   Pulse 66   Temp 98.2 F (36.8 C) (Oral)   Resp 16   Wt 155 lb 9.6 oz (70.6 kg)   SpO2 99%   BMI 28.46 kg/m    Subjective:    Patient ID: Alice Simmons, female    DOB: 07/15/1955, 61 y.o.   MRN: [id number]  HPI: Alice Simmons is a 61 y.o. female  Chief Complaint  Patient presents with  . Follow-up    HPI High cholesterol; last LDL dropped 157 down to 106; watching what she eats Not  Much red meats Some whole grains Special K  High blood pressure; excellent; not much salt; feels tired;   Gaining weight, might be medicines  Prediabetes; last 3 A1c readings were less than 5.7  Headaches; almost every day; has been to neurologist; psychiatrist change her medicine, now on 150 mg of topamax On gabapentin  Smoking; not much any more; she is thinking of quitting  Depression screen [facility] 2/9 05/12/2017 01/22/2017 12/25/2016 10/08/2016 06/03/2016  Decreased Interest 0 0 0 1 0  Down, Depressed, Hopeless 1 1 1 1  0  PHQ - 2 Score 1 1 1 2  0  Altered sleeping - - - 1 -  Tired, decreased energy - - - 1 -  Change in appetite - - - 1 -  Feeling bad or failure about yourself  - - - 1 -  Trouble concentrating - - - 1 -  Moving slowly or fidgety/restless - - - 1 -  Suicidal thoughts - - - 0 -  PHQ-9 Score - - - 8 -  Difficult doing work/chores - - - Not difficult at all -  Some encounter information is confidential and restricted. Go to Review Flowsheets activity to see all data.    Relevant past medical, surgical, family and social history reviewed Past Medical History:  Diagnosis Date  . Anxiety   . Arthritis 11/15/2012  . Brain tumor ([place])   . Breast pain   . Depression, controlled   . 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 Fibromyalgia   . Hyperlipidemia   . Hypertension, benign   . Insomnia   . Pain of both breasts    Past Surgical History:  Procedure Laterality Date  . ABDOMINAL HYSTERECTOMY    . ABDOMINAL HYSTERECTOMY  2010  .  BELPHAROPTOSIS REPAIR Bilateral 2010  . BILATERAL CARPAL TUNNEL RELEASE    . BREAST BIOPSY Bilateral 1980   EXCISIONAL - NEG  . BREAST BIOPSY Right 1985   CORE W/OUT CLIP - NEG  . BREAST SURGERY     benign breast cyst  . CARPAL TUNNEL RELEASE    . CHOLECYSTECTOMY  2014  . EYE SURGERY     cataract surger both eyes   Family History  Problem Relation Age of Onset  . Heart disease Mother   . Diabetes Mother   . Hypertension Mother   . Diabetes Father   . Hypertension Father   . Hyperlipidemia Sister   . Hyperlipidemia Brother   . Heart disease Brother   . Hyperlipidemia Brother   . Hyperlipidemia Brother   . Drug abuse Brother   . Depression Brother   . Anxiety disorder Brother   . Hyperlipidemia Brother   . Breast cancer Paternal Aunt   . Neuropathy Neg Hx    Social History   Tobacco Use  . Smoking status: Current Some Day Smoker    Packs/day: 1.00    Years: 20.00    Pack  years: 20.00    Types: Cigarettes  . Smokeless tobacco: Never Used  Substance Use Topics  . Alcohol use: No    Alcohol/week: 0.6 oz    Types: 1 Standard drinks or equivalent per week  . Drug use: No    Comment: smokes MJ 2 times per day    Interim medical history since last visit reviewed. Allergies and medications reviewed  Review of Systems Per HPI unless specifically indicated above     Objective:    BP 116/64   Pulse 66   Temp 98.2 F (36.8 C) (Oral)   Resp 16   Wt 155 lb 9.6 oz (70.6 kg)   SpO2 99%   BMI 28.46 kg/m   Wt Readings from Last 3 Encounters:  05/12/17 155 lb 9.6 oz (70.6 kg)  01/22/17 126 lb 12.8 oz (57.5 kg)  12/25/16 123 lb 8 oz (56 kg)   today's weight without shoes Physical Exam  Constitutional: She appears well-developed and well-nourished. No distress.  Weight gain noted; overweight  HENT:  Mouth/Throat: Oropharynx is clear and moist.  Eyes: EOM are normal. No scleral icterus.  Neck: No thyromegaly present.  Cardiovascular: Normal rate, regular rhythm  and normal heart sounds.  No murmur heard. Pulmonary/Chest: Effort normal and breath sounds normal.  Abdominal: She exhibits no distension.  Musculoskeletal: Normal range of motion.  Neurological: She is alert.  Skin: No pallor.  Psychiatric: She has a normal mood and affect.    Results for orders placed or performed during the hospital encounter of 12/24/16  Comprehensive metabolic panel  Result Value Ref Range   Sodium 142 135 - 145 mmol/L   Potassium 4.3 3.5 - 5.1 mmol/L   Chloride 105 101 - 111 mmol/L   CO2 28 22 - 32 mmol/L   Glucose, Bld 111 (H) 65 - 99 mg/dL   BUN 16 6 - 20 mg/dL   Creatinine, Ser 0.60 0.44 - 1.00 mg/dL   Calcium 9.6 8.9 - 10.3 mg/dL   Total Protein 7.1 6.5 - 8.1 g/dL   Albumin 4.0 3.5 - 5.0 g/dL   AST 19 15 - 41 U/L   ALT 15 14 - 54 U/L   Alkaline Phosphatase 68 38 - 126 U/L   Total Bilirubin 0.6 0.3 - 1.2 mg/dL   GFR calc non Af Amer >60 >60 mL/min   GFR calc Af Amer >60 >60 mL/min   Anion gap 9 5 - 15  Ethanol  Result Value Ref Range   Alcohol, Ethyl (B) <5 <5 mg/dL  Salicylate level  Result Value Ref Range   Salicylate Lvl <3.0 2.8 - 30.0 mg/dL  Acetaminophen level  Result Value Ref Range   Acetaminophen (Tylenol), Serum <10 (L) 10 - 30 ug/mL  cbc  Result Value Ref Range   WBC 5.8 3.6 - 11.0 K/uL   RBC 4.34 3.80 - 5.20 MIL/uL   Hemoglobin 13.8 12.0 - 16.0 g/dL   HCT 39.9 35.0 - 47.0 %   MCV 91.8 80.0 - 100.0 fL   MCH 31.7 26.0 - 34.0 pg   MCHC 34.5 32.0 - 36.0 g/dL   RDW 13.6 11.5 - 14.5 %   Platelets 361 150 - 440 K/uL  Urine Drug Screen, Qualitative  Result Value Ref Range   Tricyclic, Ur Screen NONE DETECTED NONE DETECTED   Amphetamines, Ur Screen NONE DETECTED NONE DETECTED   MDMA (Ecstasy)Ur Screen NONE DETECTED NONE DETECTED   Cocaine Metabolite,Ur [place] NONE DETECTED NONE DETECTED   Opiate, Ur Screen  NONE DETECTED NONE DETECTED   Phencyclidine (PCP) Ur S NONE DETECTED NONE DETECTED   Cannabinoid 50 Ng, Ur [place] POSITIVE (A) NONE  DETECTED   Barbiturates, Ur Screen NONE DETECTED NONE DETECTED   Benzodiazepine, Ur Scrn NONE DETECTED NONE DETECTED   Methadone Scn, Ur NONE DETECTED NONE DETECTED      Assessment & Plan:   Problem List Items Addressed This Visit      Cardiovascular and Mediastinum   Migraine (Chronic)    On topiramate      Relevant Medications   amLODipine (NORVASC) 5 MG tablet   topiramate (TOPAMAX) 100 MG tablet   Benign hypertension - Primary (Chronic)    Will decrease the amlodipine to 2.5 mg daily; as long as pressures remain under 130-140, continue that strength; if pressures creep up over 140, go back to 5 mg daily      Relevant Medications   amLODipine (NORVASC) 5 MG tablet     Other   Weight gain    Check TSH, but likely related to medicine      Relevant Orders   TSH   Vitamin D deficiency    Check level      Relevant Orders   VITAMIN D 25 Hydroxy (Vit-D Deficiency, Fractures)   Tobacco abuse (Chronic)    Encouraged her to quit entirely      Prediabetes (Chronic)    Check A1c      Relevant Orders   Hemoglobin A1c   Medication monitoring encounter    Check liver and kidneys      Relevant Orders   COMPLETE METABOLIC PANEL WITH GFR   Hyperlipidemia (Chronic)    Check lipids today; glad she is watching her diet      Relevant Medications   amLODipine (NORVASC) 5 MG tablet   Other Relevant Orders   Lipid panel   Fibromyalgia    Continue gabapentin      Bipolar I disorder, most recent episode mixed ([facility]) (Chronic)    Doing remarkably well with current regimen       Other Visit Diagnoses    Flu vaccine need       Needs flu shot       Relevant Orders   Flu Vaccine QUAD 6+ mos PF IM (Fluarix Quad PF) (Completed)       Follow up plan: Return in about 4 months (around 09/09/2017).  An after-visit summary was printed and given to the patient at [place].  Please see the patient instructions which may contain other information and recommendations beyond  what is mentioned above in the assessment and plan.  No orders of the defined types were placed in this encounter.   Orders Placed This Encounter  Procedures  . Flu Vaccine QUAD 6+ mos PF IM (Fluarix Quad PF)  . VITAMIN D 25 Hydroxy (Vit-D Deficiency, Fractures)  . TSH  . Hemoglobin A1c  . COMPLETE METABOLIC PANEL WITH GFR  . Lipid panel

## 2017-05-12 NOTE — Assessment & Plan Note (Signed)
Check A1c. 

## 2017-05-12 NOTE — Assessment & Plan Note (Signed)
Doing remarkably well with current regimen

## 2017-05-12 NOTE — Assessment & Plan Note (Signed)
Continue gabapentin.

## 2017-05-12 NOTE — Assessment & Plan Note (Signed)
On topiramate

## 2017-05-12 NOTE — Assessment & Plan Note (Signed)
Check liver and kidneys 

## 2017-05-12 NOTE — Patient Instructions (Addendum)
I do encourage you to quit smoking Call [phone] to sign up for smoking cessation classes You can call 1-800-QUIT-NOW to talk with a smoking cessation coach We'll get labs today and contact you  Decrease the amlodipine to 2.5 mg daily If your blood pressure goes up over 140, then go back to 5 mg daily and let me know   Steps to Quit Smoking Smoking tobacco can be bad for your health. It can also affect almost every organ in your body. Smoking puts you and people around you at risk for many serious long-lasting (chronic) diseases. Quitting smoking is hard, but it is one of the best things that you can do for your health. It is never too late to quit. What are the benefits of quitting smoking? When you quit smoking, you lower your risk for getting serious diseases and conditions. They can include:  Lung cancer or lung disease.  Heart disease.  Stroke.  Heart attack.  Not being able to have children (infertility).  Weak bones (osteoporosis) and broken bones (fractures).  If you have coughing, wheezing, and shortness of breath, those symptoms may get better when you quit. You may also get sick less often. If you are pregnant, quitting smoking can help to lower your chances of having a baby of low birth weight. What can I do to help me quit smoking? Talk with your doctor about what can help you quit smoking. Some things you can do (strategies) include:  Quitting smoking totally, instead of slowly cutting back how much you smoke over a period of time.  Going to in-person counseling. You are more likely to quit if you go to many counseling sessions.  Using resources and support systems, such as: ? Database administrator with a Social worker. ? Phone quitlines. ? Careers information officer. ? Support groups or group counseling. ? Text messaging programs. ? Mobile phone apps or applications.  Taking medicines. Some of these medicines may have nicotine in them. If you are pregnant or  breastfeeding, do not take any medicines to quit smoking unless your doctor says it is okay. Talk with your doctor about counseling or other things that can help you.  Talk with your doctor about using more than one strategy at the same time, such as taking medicines while you are also going to in-person counseling. This can help make quitting easier. What things can I do to make it easier to quit? Quitting smoking might feel very hard at first, but there is a lot that you can do to make it easier. Take these steps:  Talk to your family and friends. Ask them to support and encourage you.  Call phone quitlines, reach out to support groups, or work with a Social worker.  Ask people who smoke to not smoke around you.  Avoid places that make you want (trigger) to smoke, such as: ? Bars. ? Parties. ? Smoke-break areas at work.  Spend time with people who do not smoke.  Lower the stress in your life. Stress can make you want to smoke. Try these things to help your stress: ? Getting regular exercise. ? Deep-breathing exercises. ? Yoga. ? Meditating. ? Doing a body scan. To do this, close your eyes, focus on one area of your body at a time from head to toe, and notice which parts of your body are tense. Try to relax the muscles in those areas.  Download or buy apps on your mobile phone or tablet that can help you stick to your  quit plan. There are many free apps, such as QuitGuide from the [employer] Office manager for Disease Control and Prevention). You can find more support from smokefree.gov and other websites.  This information is not intended to replace advice given to you by your health care provider. Make sure you discuss any questions you have with your health care provider. Document Released: 03/28/2009 Document Revised: 01/28/2016 Document Reviewed: 10/16/2014 Elsevier Interactive Patient Education  2018 [employer].

## 2017-05-13 LAB — COMPLETE METABOLIC PANEL WITH GFR
AG Ratio: 1.7 (calc) (ref 1.0–2.5)
ALBUMIN MSPROF: 4.2 g/dL (ref 3.6–5.1)
ALT: 11 U/L (ref 6–29)
AST: 14 U/L (ref 10–35)
Alkaline phosphatase (APISO): 77 U/L (ref 33–130)
BUN: 19 mg/dL (ref 7–25)
CALCIUM: 9.7 mg/dL (ref 8.6–10.4)
CO2: 26 mmol/L (ref 20–32)
CREATININE: 0.88 mg/dL (ref 0.50–1.05)
Chloride: 109 mmol/L (ref 98–110)
GFR, EST NON AFRICAN AMERICAN: 72 mL/min/{1.73_m2} (ref >=60)
GFR, Est African American: 84 mL/min/{1.73_m2} (ref >=60)
GLOBULIN: 2.5 g/dL (ref 1.9–3.7)
Glucose, Bld: 100 mg/dL — ABNORMAL HIGH (ref 65–99)
Potassium: 4.4 mmol/L (ref 3.5–5.3)
SODIUM: 142 mmol/L (ref 135–146)
TOTAL PROTEIN: 6.7 g/dL (ref 6.1–8.1)
Total Bilirubin: 0.3 mg/dL (ref 0.2–1.2)

## 2017-05-13 LAB — LIPID PANEL
CHOL/HDL RATIO: 5.5 (calc) — AB (ref <5.0)
Cholesterol: 246 mg/dL — ABNORMAL HIGH (ref <200)
HDL: 45 mg/dL — ABNORMAL LOW (ref >50)
LDL Cholesterol (Calc): 175 mg/dL (calc) — ABNORMAL HIGH
NON-HDL CHOLESTEROL (CALC): 201 mg/dL — AB (ref <130)
Triglycerides: 125 mg/dL (ref <150)

## 2017-05-13 LAB — HEMOGLOBIN A1C
HEMOGLOBIN A1C: 5.1 %{Hb} (ref <5.7)
MEAN PLASMA GLUCOSE: 100 (calc)
eAG (mmol/L): 5.5 (calc)

## 2017-05-13 LAB — VITAMIN D 25 HYDROXY (VIT D DEFICIENCY, FRACTURES): Vit D, 25-Hydroxy: 19 ng/mL — ABNORMAL LOW (ref 30–100)

## 2017-05-13 LAB — TSH: TSH: 11.2 mIU/L — ABNORMAL HIGH (ref 0.40–4.50)

## 2017-05-18 ENCOUNTER — Other Ambulatory Visit: Payer: Self-pay | Admitting: Family Medicine

## 2017-05-18 DIAGNOSIS — E782 Mixed hyperlipidemia: Secondary | ICD-10-CM

## 2017-05-18 DIAGNOSIS — E039 Hypothyroidism, unspecified: Secondary | ICD-10-CM

## 2017-05-18 DIAGNOSIS — Z5181 Encounter for therapeutic drug level monitoring: Secondary | ICD-10-CM

## 2017-05-18 MED ORDER — VITAMIN D (ERGOCALCIFEROL) 1.25 MG (50000 UNIT) PO CAPS: 50000.0000 [IU] | ORAL_CAPSULE | ORAL | 1 refills | Status: AC

## 2017-05-18 MED ORDER — LEVOTHYROXINE SODIUM 75 MCG PO TABS: 75.0000 ug | ORAL_TABLET | Freq: Every day | ORAL | 1 refills | Status: DC

## 2017-05-18 MED ORDER — ATORVASTATIN CALCIUM 20 MG PO TABS: 20.0000 mg | ORAL_TABLET | Freq: Every day | ORAL | 1 refills | Status: DC

## 2017-05-18 NOTE — Progress Notes (Signed)
Start back on thyroid med, start chol med; start vit D rx Recheck labs in 8 weeks

## 2017-06-12 ENCOUNTER — Other Ambulatory Visit: Payer: Self-pay | Admitting: Family Medicine

## 2017-06-23 ENCOUNTER — Other Ambulatory Visit: Payer: Self-pay | Admitting: Family Medicine

## 2017-06-24 NOTE — Telephone Encounter (Signed)
Normal Cr and K+ in Nov 2018; Rx approved

## 2017-07-09 ENCOUNTER — Other Ambulatory Visit: Payer: Self-pay | Admitting: Family Medicine

## 2017-07-09 NOTE — Telephone Encounter (Signed)
Patient is due for labs on Wednesday Jan 30th She should not run out of her medicine until we get those lab results back so we can adjust the dose if needed (results should be back Jan 31st or Feb 1st) Thank you

## 2017-07-09 NOTE — Telephone Encounter (Signed)
Pt notified will have to come in on Monday because pt is going out of town on wed

## 2017-07-12 ENCOUNTER — Other Ambulatory Visit: Payer: Self-pay

## 2017-07-12 DIAGNOSIS — E782 Mixed hyperlipidemia: Secondary | ICD-10-CM | POA: Diagnosis not present

## 2017-07-12 DIAGNOSIS — Z5181 Encounter for therapeutic drug level monitoring: Secondary | ICD-10-CM

## 2017-07-12 DIAGNOSIS — E039 Hypothyroidism, unspecified: Secondary | ICD-10-CM

## 2017-07-13 LAB — TSH: TSH: 4.75 m[IU]/L — AB (ref 0.40–4.50)

## 2017-07-13 LAB — LIPID PANEL
Cholesterol: 248 mg/dL — ABNORMAL HIGH (ref <200)
HDL: 43 mg/dL — ABNORMAL LOW (ref >50)
LDL Cholesterol (Calc): 178 mg/dL (calc) — ABNORMAL HIGH
NON-HDL CHOLESTEROL (CALC): 205 mg/dL — AB (ref <130)
Total CHOL/HDL Ratio: 5.8 (calc) — ABNORMAL HIGH (ref <5.0)
Triglycerides: 133 mg/dL (ref <150)

## 2017-07-13 LAB — ALT: ALT: 13 U/L (ref 6–29)

## 2017-07-15 ENCOUNTER — Other Ambulatory Visit: Payer: Self-pay | Admitting: Family Medicine

## 2017-07-15 DIAGNOSIS — E782 Mixed hyperlipidemia: Secondary | ICD-10-CM

## 2017-07-15 DIAGNOSIS — E039 Hypothyroidism, unspecified: Secondary | ICD-10-CM

## 2017-07-15 MED ORDER — LEVOTHYROXINE SODIUM 88 MCG PO TABS: 88.0000 ug | ORAL_TABLET | Freq: Every day | ORAL | 1 refills | Status: DC

## 2017-07-15 MED ORDER — ATORVASTATIN CALCIUM 40 MG PO TABS: 40.0000 mg | ORAL_TABLET | Freq: Every day | ORAL | 1 refills | Status: DC

## 2017-07-15 NOTE — Progress Notes (Signed)
Rx sent to [facility] locally Increasing statin Increasing thyroid dose Recheck labs in 6-8 weeks

## 2017-07-21 IMAGING — US US BREAST*L* LIMITED INC AXILLA
1 series · 13 of 17 positions shown · non-contrast
Comparison: Previous exam(s).

ADDENDUM:
Correction to the laterality of the masses in the original report.
ALL of the masses described are in the LEFT breast. The right breast
was not evaluated. Therefore, a bilateral diagnostic mammogram and
LEFT breast ultrasound is recommended in 6 months from the exam.
CLINICAL DATA: 59-year-old female presenting for a follow-up of a
probably benign left breast mass.

EXAM:
ULTRASOUND OF THE LEFT BREAST

[Series 1: us breast*left* limited inc axilla · 0.06mm/px · 13 of 17 slices shown]
[im 1/17]
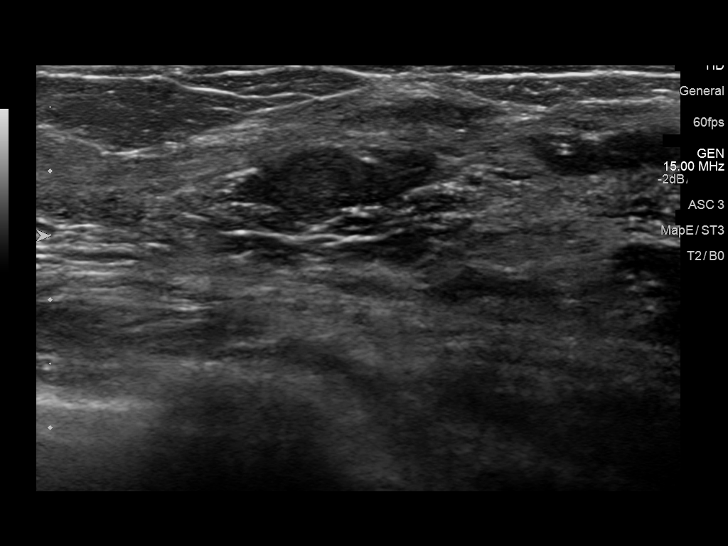
[im 2/17]
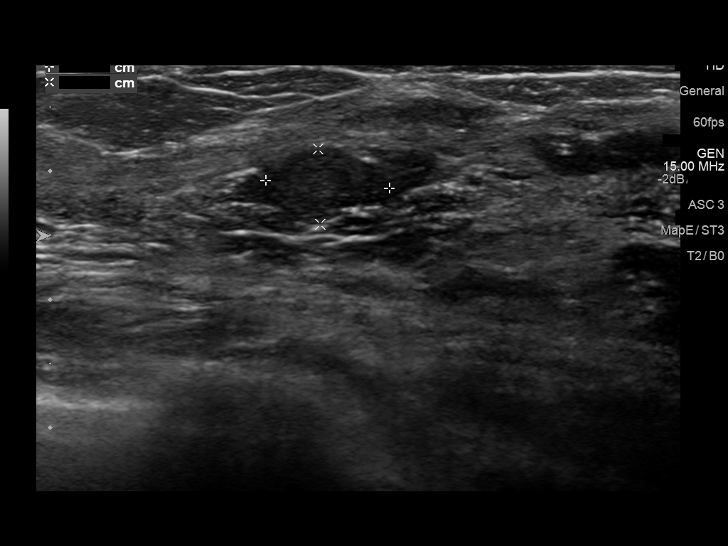
[im 4/17]
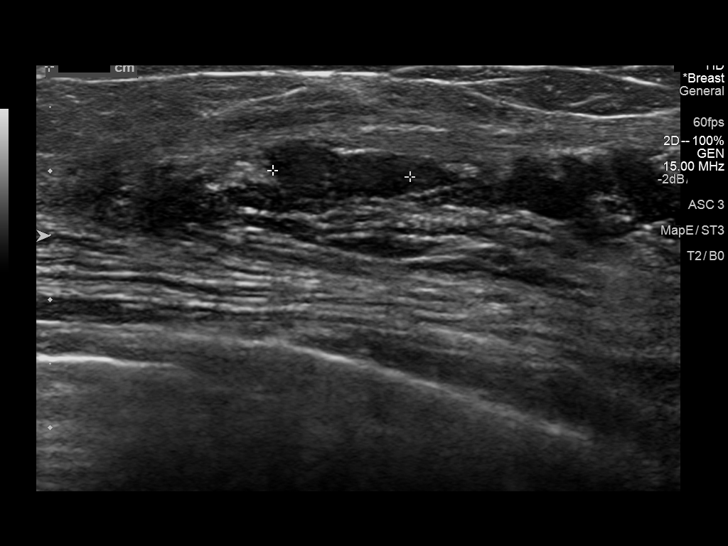
[im 5/17]
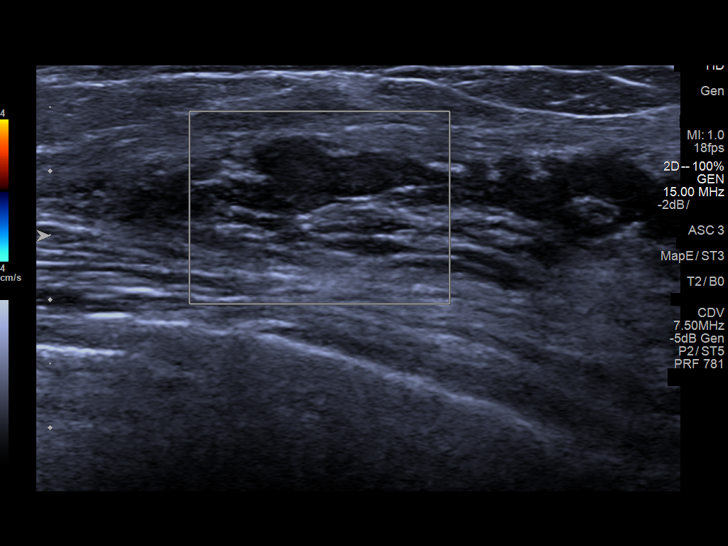
[im 6/17]
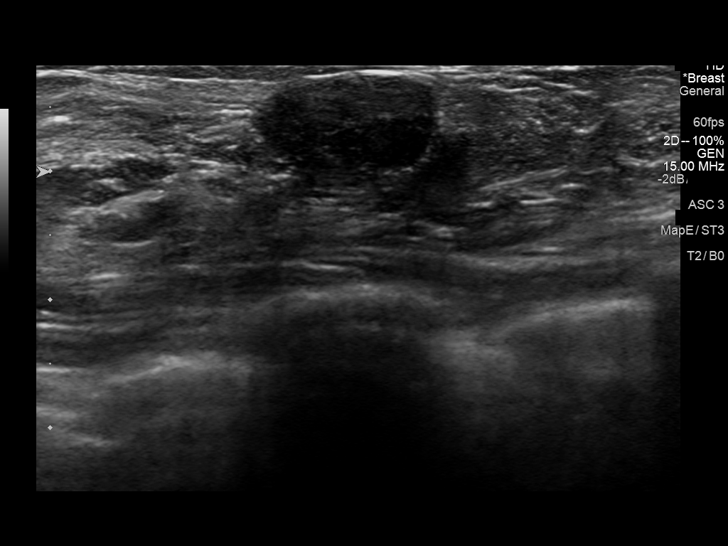
[im 8/17]
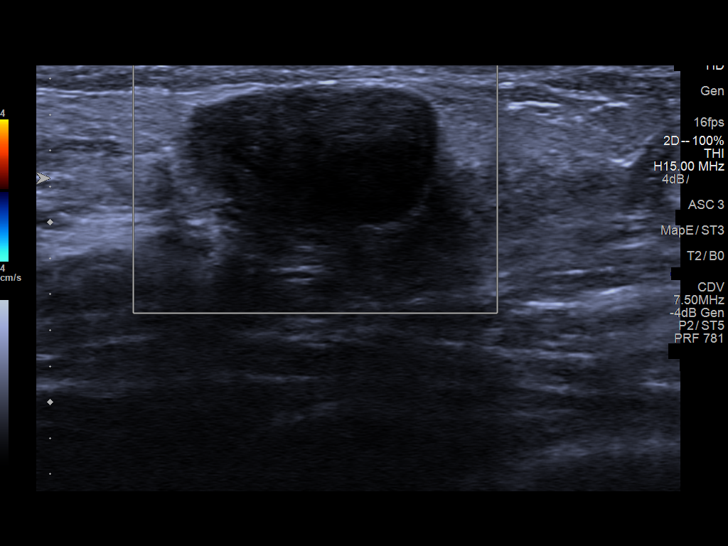
[im 9/17]
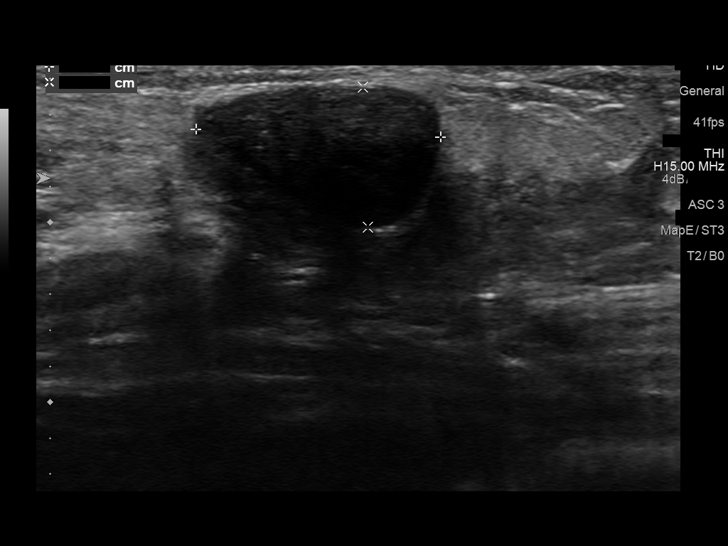
[im 10/17]
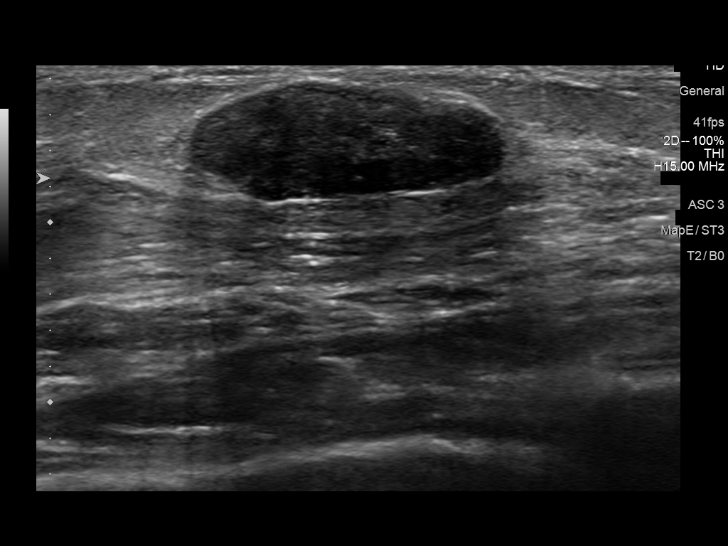
[im 12/17]
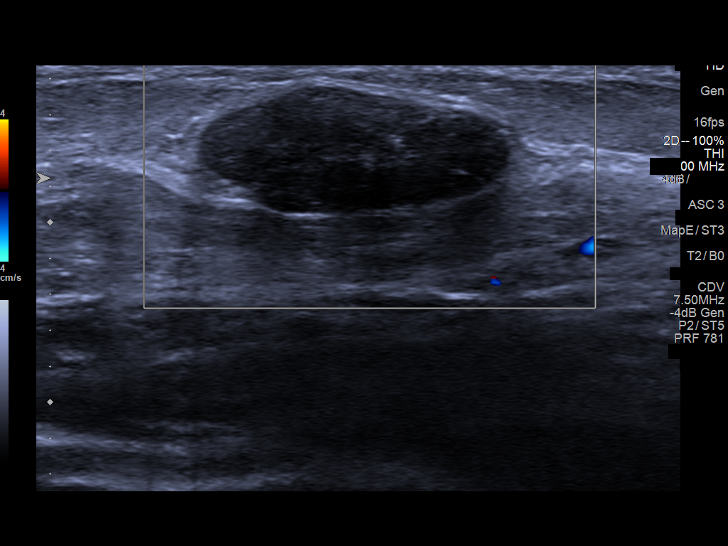
[im 13/17]
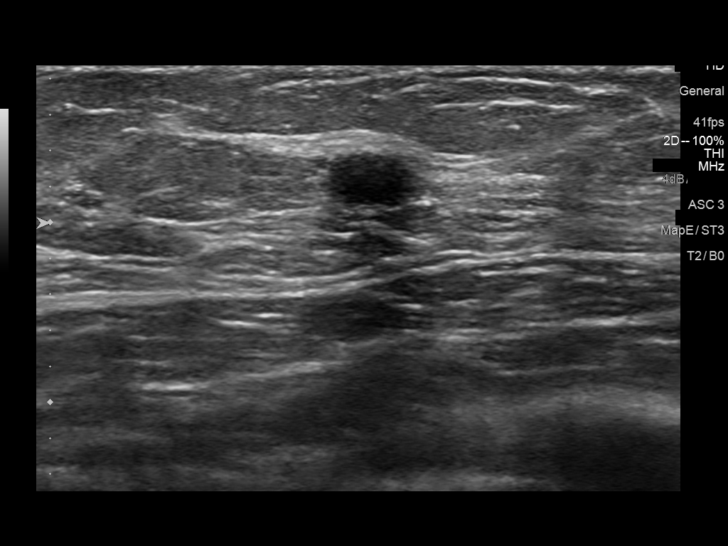
[im 14/17]
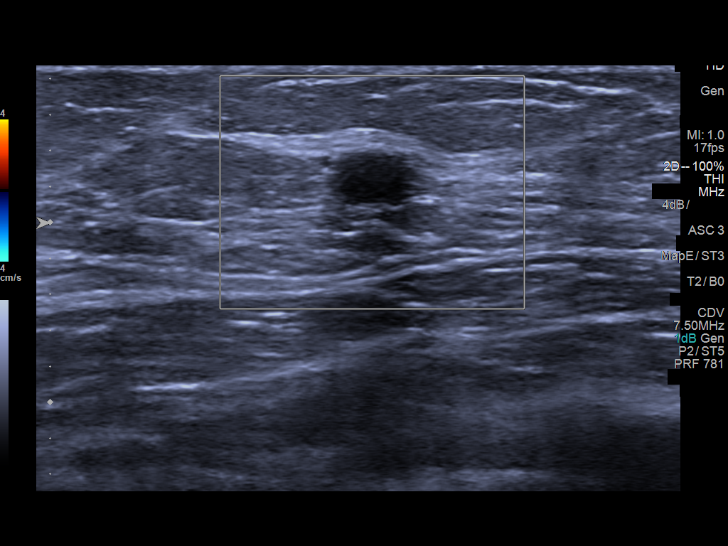
[im 16/17]
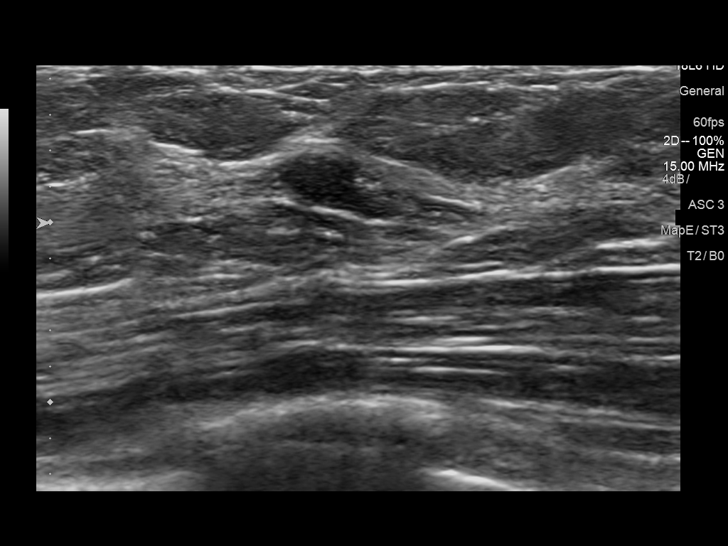
[im 17/17]
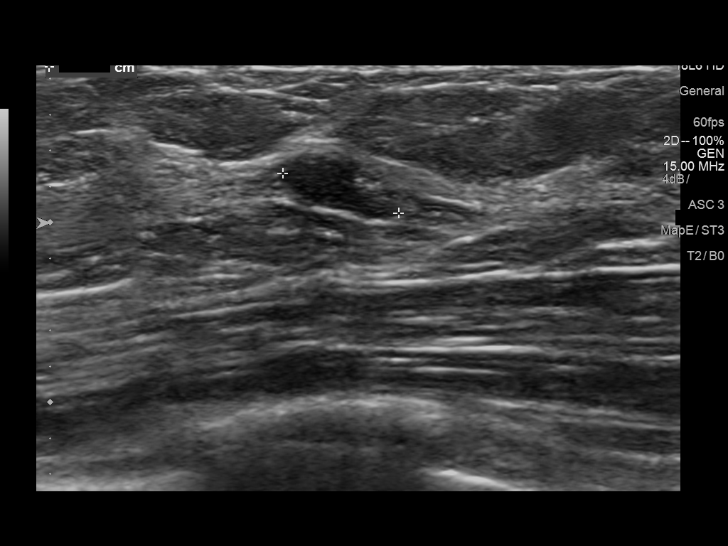

[13 of 17 positions shown; findings below may reference images not displayed]

FINDINGS: Three probably benign masses are again identified in the left
breast; two of these have now demonstrated stability for greater
than 2 years, and are therefore benign. These include the mass at 1
o'clock, 4 cm from the nipple measuring 0.8 x 0.5 x 1.0 cm, and the
mass at 3 o'clock, 4 cm from the nipple measuring 1.7 x 0.7 x
cm.

The mass at 1 o'clock, 6 cm from the nipple has demonstrated 6
months of stability measuring 1.0 x 0.4 x 0.9 cm.
IMPRESSION: 1. The 2 masses in the right breast at 1 o'clock, 4 cm from the
nipple and at 3 o'clock, 4 cm from the nipple have demonstrated 2
years of stability, consistent with a benign finding.

2. The mass at 1 o'clock, 6 cm from the nipple has demonstrated 6
months of stability, and is probably benign.

RECOMMENDATION:
Six-month follow-up bilateral diagnostic mammogram and right breast
ultrasound is recommended to ensure stability of the right breast
mass at 1 o'clock, 6 cm from the nipple.

I have discussed the findings and recommendations with the patient.
Results were also provided in writing at the conclusion of the
visit. If applicable, a reminder letter will be sent to the patient
regarding the next appointment.

BI-RADS CATEGORY  3: Probably benign finding(s) - short interval
follow-up suggested.

## 2017-07-31 NOTE — Progress Notes (Signed)
Signing off on lab order note

## 2017-07-31 NOTE — Progress Notes (Signed)
Closing out lab/order note open since:  May 2018

## 2017-07-31 NOTE — Progress Notes (Signed)
Closing old lab/order note

## 2017-08-12 ENCOUNTER — Telehealth: Payer: Self-pay

## 2017-08-12 DIAGNOSIS — R928 Other abnormal and inconclusive findings on diagnostic imaging of breast: Secondary | ICD-10-CM

## 2017-08-12 NOTE — Telephone Encounter (Signed)
Copied from [place]. Topic: General - Other >> Aug 12, 2017 11:42 AM Valla Leaver wrote: Reason for CRM: Patient's husband, Clifton James, calling to request an order for mammo and breast US for his wife. It was recommended by [facility] by the hospital. Please call back once orders are placed.   Called [facility] spoke with Roselyn Reef who states that the pt is not due for diagnostic mammo and u/s until May due to last report.   Called Clifton James (pt's husband) back and informed him of this, he gave verbal understanding.  Per Jamie's request went ahead and placed orders so that pt can schedule at her convenience.

## 2017-08-31 DIAGNOSIS — D329 Benign neoplasm of meninges, unspecified: Secondary | ICD-10-CM | POA: Diagnosis not present

## 2017-09-03 ENCOUNTER — Telehealth: Payer: Self-pay | Admitting: Family Medicine

## 2017-09-03 ENCOUNTER — Ambulatory Visit (INDEPENDENT_AMBULATORY_CARE_PROVIDER_SITE_OTHER): Payer: Medicare Other | Admitting: Family Medicine

## 2017-09-03 ENCOUNTER — Encounter: Payer: Self-pay | Admitting: Family Medicine

## 2017-09-03 VITALS — BP 119/72 | HR 80 | Temp 98.1°F | Ht 62.0 in | Wt 167.4 lb

## 2017-09-03 DIAGNOSIS — Z720 Tobacco use: Secondary | ICD-10-CM

## 2017-09-03 DIAGNOSIS — M791 Myalgia, unspecified site: Secondary | ICD-10-CM | POA: Diagnosis not present

## 2017-09-03 DIAGNOSIS — E039 Hypothyroidism, unspecified: Secondary | ICD-10-CM | POA: Diagnosis not present

## 2017-09-03 DIAGNOSIS — Z5181 Encounter for therapeutic drug level monitoring: Secondary | ICD-10-CM

## 2017-09-03 DIAGNOSIS — J3089 Other allergic rhinitis: Secondary | ICD-10-CM | POA: Diagnosis not present

## 2017-09-03 DIAGNOSIS — I10 Essential (primary) hypertension: Secondary | ICD-10-CM | POA: Diagnosis not present

## 2017-09-03 DIAGNOSIS — E559 Vitamin D deficiency, unspecified: Secondary | ICD-10-CM

## 2017-09-03 DIAGNOSIS — E669 Obesity, unspecified: Secondary | ICD-10-CM

## 2017-09-03 DIAGNOSIS — E782 Mixed hyperlipidemia: Secondary | ICD-10-CM

## 2017-09-03 MED ORDER — FLUTICASONE PROPIONATE 50 MCG/ACT NA SUSP: 2.0000 | Freq: Every day | NASAL | 6 refills | Status: AC

## 2017-09-03 NOTE — Assessment & Plan Note (Signed)
Encouragement given to lose 5-10 pounds; medicine likely contributing, but healthy mind is so important

## 2017-09-03 NOTE — Assessment & Plan Note (Signed)
Check kidneys and liver 

## 2017-09-03 NOTE — Patient Instructions (Addendum)
Try to use PLAIN allergy medicine without the decongestant Avoid: phenylephrine, phenylpropanolamine, and pseudoephredine  Check out the information at familydoctor.org entitled "Nutrition for Weight Loss: What You Need to Know about Fad Diets" Try to lose between 1-2 pounds per week by taking in fewer calories and burning off more calories You can succeed by limiting portions, limiting foods dense in calories and fat, becoming more active, and drinking 8 glasses of water a day (64 ounces) Don't skip meals, especially breakfast, as skipping meals may alter your metabolism Do not use over-the-counter weight loss pills or gimmicks that claim rapid weight loss A healthy BMI (or body mass index) is between 18.5 and 24.9 You can calculate your ideal BMI at the [employer] website ClubMonetize.fr  Preventing Unhealthy Weight Gain, Adult Staying at a healthy weight is important. When fat builds up in your body, you may become overweight or obese. These conditions put you at greater risk for developing certain health problems, such as heart disease, diabetes, sleeping problems, joint problems, and some cancers. Unhealthy weight gain is often the result of making unhealthy choices in what you eat. It is also a result of not getting enough exercise. You can make changes to your lifestyle to prevent obesity and stay as healthy as possible. What nutrition changes can be made? To maintain a healthy weight and prevent obesity:  Eat only as much as your body needs. To do this: ? Pay attention to signs that you are hungry or full. Stop eating as soon as you feel full. ? If you feel hungry, try drinking water first. Drink enough water so your urine is clear or pale yellow. ? Eat smaller portions. ? Look at serving sizes on food labels. Most foods contain more than one serving per container. ? Eat the recommended amount of calories for your gender and activity level.  While most active people should eat around 2,000 calories per day, if you are trying to lose weight or are not very active, you main need to eat less calories. Talk to your health care provider or dietitian about how many calories you should eat each day.  Choose healthy foods, such as: ? Fruits and vegetables. Try to fill at least half of your plate at each meal with fruits and vegetables. ? Whole grains, such as whole wheat bread, brown rice, and quinoa. ? Lean meats, such as chicken or fish. ? Other healthy proteins, such as beans, eggs, or tofu. ? Healthy fats, such as nuts, seeds, fatty fish, and olive oil. ? Low-fat or fat-free dairy.  Check food labels and avoid food and drinks that: ? Are high in calories. ? Have added sugar. ? Are high in sodium. ? Have saturated fats or trans fats.  Limit how much you eat of the following foods: ? Prepackaged meals. ? Fast food. ? Fried foods. ? Processed meat, such as bacon, sausage, and deli meats. ? Fatty cuts of red meat and poultry with skin.  Cook foods in healthier ways, such as by baking, broiling, or grilling.  When grocery shopping, try to shop around the outside of the store. This helps you buy mostly fresh foods and avoid canned and prepackaged foods.  What lifestyle changes can be made?  Exercise at least 30 minutes 5 or more days each week. Exercising includes brisk walking, yard work, biking, running, swimming, and team sports like basketball and soccer. Ask your health care provider which exercises are safe for you.  Do not use any products that  contain nicotine or tobacco, such as cigarettes and e-cigarettes. If you need help quitting, ask your health care provider.  Limit alcohol intake to no more than 1 drink a day for nonpregnant women and 2 drinks a day for men. One drink equals 12 oz of beer, 5 oz of wine, or 1 oz of hard liquor.  Try to get 7-9 hours of sleep each night. What other changes can be made?  Keep a  food and activity journal to keep track of: ? What you ate and how many calories you had. Remember to count sauces, dressings, and side dishes. ? Whether you were active, and what exercises you did. ? Your calorie, weight, and activity goals.  Check your weight regularly. Track any changes. If you notice you have gained weight, make changes to your diet or activity routine.  Avoid taking weight-loss medicines or supplements. Talk to your health care provider before starting any new medicine or supplement.  Talk to your health care provider before trying any new diet or exercise plan. Why are these changes important? Eating healthy, staying active, and having healthy habits not only help prevent obesity, they also:  Help you to manage stress and emotions.  Help you to connect with friends and family.  Improve your self-esteem.  Improve your sleep.  Prevent long-term health problems.  What can happen if changes are not made? Being obese or overweight can cause you to develop joint or bone problems, which can make it hard for you to stay active or do activities you enjoy. Being obese or overweight also puts stress on your heart and lungs and can lead to health problems like diabetes, heart disease, and some cancers. Where to find more information: Talk with your health care provider or a dietitian about healthy eating and healthy lifestyle choices. You may also find other information through these resources:  U.S. Department of Agriculture MyPlate: FormerBoss.no  American Heart Association: www.heart.org  Centers for Disease Control and Prevention: http://www.wolf.info/  Summary  Staying at a healthy weight is important. It helps prevent certain diseases and health problems, such as heart disease, diabetes, joint problems, sleep disorders, and some cancers.  Being obese or overweight can cause you to develop joint or bone problems, which can make it hard for you to stay active or do  activities you enjoy.  You can prevent unhealthy weight gain by eating a healthy diet, exercising regularly, not smoking, limiting alcohol, and getting enough sleep.  Talk with your health care provider or a dietitian for guidance about healthy eating and healthy lifestyle choices. This information is not intended to replace advice given to you by your health care provider. Make sure you discuss any questions you have with your health care provider. Document Released: 06/02/2016 Document Revised: 07/08/2016 Document Reviewed: 07/08/2016 Elsevier Interactive Patient Education  [employer].

## 2017-09-03 NOTE — Assessment & Plan Note (Signed)
Check TSH 

## 2017-09-03 NOTE — Assessment & Plan Note (Signed)
Avoid decongestants 

## 2017-09-03 NOTE — Assessment & Plan Note (Signed)
Limit saturated fats 

## 2017-09-03 NOTE — Assessment & Plan Note (Signed)
QUIT-NOW number given

## 2017-09-03 NOTE — Telephone Encounter (Signed)
Patient is moving to [place] Please remind her that she's due for repeat imaging in May of 2019 She'll want to have a doctor down there order those for her She might want to arrange to get copies of the previous images to take with her, to make things easier when it comes time for her to start new with the imaging center in [place] Thank you

## 2017-09-03 NOTE — Assessment & Plan Note (Signed)
Check level and supplement if needed 

## 2017-09-03 NOTE — Assessment & Plan Note (Signed)
Excellent blood pressure

## 2017-09-03 NOTE — Progress Notes (Signed)
BP 119/72 (BP Location: Right Arm, Patient Position: Sitting, Cuff Size: Large)   Pulse 80   Temp 98.1 F (36.7 C) (Oral)   Ht 5\' 2"  (1.575 m)   Wt 167 lb 6.4 oz (75.9 kg)   SpO2 97%   BMI 30.62 kg/m    Subjective:    Patient ID: Alice Simmons, female    DOB: October 09, 1955, 62 y.o.   MRN: [id number]  HPI: Alice Simmons is a 62 y.o. female  Chief Complaint  Patient presents with  . Follow-up    HPI Patient has muscle aches; considered to have tested for lupus Little dry skin on the forehead Low vitamin D; used to be on the Rx Low energy High cholesterol; avoiding fatty foods Allergic rhinitis; using OTC zyrtec and that works well  Depression screen [facility] 2/9 09/03/2017 05/12/2017 01/22/2017 12/25/2016 10/08/2016  Decreased Interest 1 0 0 0 1  Down, Depressed, Hopeless 1 1 1 1 1   PHQ - 2 Score 2 1 1 1 2   Altered sleeping 1 - - - 1  Tired, decreased energy 3 - - - 1  Change in appetite 1 - - - 1  Feeling bad or failure about yourself  0 - - - 1  Trouble concentrating 0 - - - 1  Moving slowly or fidgety/restless 1 - - - 1  Suicidal thoughts 0 - - - 0  PHQ-9 Score 8 - - - 8  Difficult doing work/chores Somewhat difficult - - - Not difficult at all  Some encounter information is confidential and restricted. Go to Review Flowsheets activity to see all data.    Relevant past medical, surgical, family and social history reviewed Past Medical History:  Diagnosis Date  . Anxiety   . Arthritis 11/15/2012  . Brain tumor ([place])   . Breast pain   . Depression, controlled   . 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 Fibromyalgia   . Hyperlipidemia   . Hypertension, benign   . Insomnia   . Pain of both breasts    Past Surgical History:  Procedure Laterality Date  . ABDOMINAL HYSTERECTOMY    . ABDOMINAL HYSTERECTOMY  2010  . BELPHAROPTOSIS REPAIR Bilateral 2010  . BILATERAL CARPAL TUNNEL RELEASE    . BREAST BIOPSY Bilateral 1980   EXCISIONAL - NEG  . BREAST  BIOPSY Right 1985   CORE W/OUT CLIP - NEG  . BREAST SURGERY     benign breast cyst  . CARPAL TUNNEL RELEASE    . CHOLECYSTECTOMY  2014  . EYE SURGERY     cataract surger both eyes   Family History  Problem Relation Age of Onset  . Heart disease Mother   . Diabetes Mother   . Hypertension Mother   . Diabetes Father   . Hypertension Father   . Hyperlipidemia Sister   . Hyperlipidemia Brother   . Heart disease Brother   . Hyperlipidemia Brother   . Hyperlipidemia Brother   . Drug abuse Brother   . Depression Brother   . Anxiety disorder Brother   . Hyperlipidemia Brother   . Breast cancer Paternal Aunt   . Neuropathy Neg Hx    Social History   Tobacco Use  . Smoking status: Current Some Day Smoker    Packs/day: 0.50    Years: 20.00    Pack years: 10.00    Types: Cigarettes  . Smokeless tobacco: Never Used  Substance Use Topics  . Alcohol use: No    Alcohol/week: 0.6  oz    Types: 1 Standard drinks or equivalent per week  . Drug use: No    Comment: smokes MJ 2 times per day    Interim medical history since last visit reviewed. Allergies and medications reviewed  Review of Systems Per HPI unless specifically indicated above     Objective:    BP 119/72 (BP Location: Right Arm, Patient Position: Sitting, Cuff Size: Large)   Pulse 80   Temp 98.1 F (36.7 C) (Oral)   Ht 5\' 2"  (1.575 m)   Wt 167 lb 6.4 oz (75.9 kg)   SpO2 97%   BMI 30.62 kg/m   Wt Readings from Last 3 Encounters:  09/03/17 167 lb 6.4 oz (75.9 kg)  05/12/17 155 lb 9.6 oz (70.6 kg)  01/22/17 126 lb 12.8 oz (57.5 kg)    Physical Exam  Constitutional: She appears well-developed and well-nourished. No distress.  HENT:  Head: Normocephalic and atraumatic.  Eyes: EOM are normal. No scleral icterus.  Neck: No thyromegaly present.  Cardiovascular: Normal rate, regular rhythm and normal heart sounds.  No murmur heard. Pulmonary/Chest: Effort normal and breath sounds normal. No respiratory  distress. She has no wheezes.  Abdominal: Soft. Bowel sounds are normal. She exhibits no distension.  Musculoskeletal: Normal range of motion. She exhibits no edema.  Neurological: She is alert. She exhibits normal muscle tone.  Skin: Skin is warm and dry. She is not diaphoretic. No pallor.  Psychiatric: She has a normal mood and affect. Her behavior is normal. Judgment and thought content normal.    Results for orders placed or performed in visit on 07/12/17  TSH  Result Value Ref Range   TSH 4.75 (H) 0.40 - 4.50 mIU/L  Lipid panel  Result Value Ref Range   Cholesterol 248 (H) <200 mg/dL   HDL 43 (L) >50 mg/dL   Triglycerides 133 <150 mg/dL   LDL Cholesterol (Calc) 178 (H) mg/dL (calc)   Total CHOL/HDL Ratio 5.8 (H) <5.0 (calc)   Non-HDL Cholesterol (Calc) 205 (H) <130 mg/dL (calc)  ALT  Result Value Ref Range   ALT 13 6 - 29 U/L      Assessment & Plan:   Problem List Items Addressed This Visit      Cardiovascular and Mediastinum   Benign hypertension (Chronic)    Excellent blood pressure      Relevant Medications   prazosin (MINIPRESS) 2 MG capsule     Respiratory   Allergic rhinitis    Avoid decongestants      Relevant Medications   fluticasone (FLONASE) 50 MCG/ACT nasal spray     Endocrine   Hypothyroidism    Check TSH      Relevant Orders   TSH     Other   Vitamin D deficiency    Check level and supplement if needed      Relevant Orders   VITAMIN D 25 Hydroxy (Vit-D Deficiency, Fractures)   Tobacco abuse (Chronic)    QUIT-NOW number given      Obesity (BMI 30.0-34.9)    Encouragement given to lose 5-10 pounds; medicine likely contributing, but healthy mind is so important      Medication monitoring encounter    Check kidneys and liver      Relevant Orders   COMPLETE METABOLIC PANEL WITH GFR   Hyperlipidemia (Chronic)    Limit saturated fats      Relevant Medications   prazosin (MINIPRESS) 2 MG capsule   Other Relevant Orders    Lipid  panel    Other Visit Diagnoses    Muscle ache    -  Primary   Relevant Orders   CK (Creatine Kinase)   ANA,IFA RA Diag Pnl w/rflx Tit/Patn       Follow up plan: Return if symptoms worsen or fail to improve.  An after-visit summary was printed and given to the patient at [facility].  Please see the patient instructions which may contain other information and recommendations beyond what is mentioned above in the assessment and plan.  Meds ordered this encounter  Medications  . fluticasone (FLONASE) 50 MCG/ACT nasal spray    Sig: Place 2 sprays into both nostrils daily.    Dispense:  16 g    Refill:  6    Orders Placed This Encounter  Procedures  . CK (Creatine Kinase)  . ANA,IFA RA Diag Pnl w/rflx Tit/Patn  . VITAMIN D 25 Hydroxy (Vit-D Deficiency, Fractures)  . TSH  . Lipid panel  . COMPLETE METABOLIC PANEL WITH GFR

## 2017-09-06 LAB — COMPLETE METABOLIC PANEL WITH GFR
AG Ratio: 1.9 (calc) (ref 1.0–2.5)
ALKALINE PHOSPHATASE (APISO): 74 U/L (ref 33–130)
ALT: 16 U/L (ref 6–29)
AST: 15 U/L (ref 10–35)
Albumin: 4.3 g/dL (ref 3.6–5.1)
BUN: 16 mg/dL (ref 7–25)
CO2: 26 mmol/L (ref 20–32)
CREATININE: 0.77 mg/dL (ref 0.50–1.05)
Calcium: 9.5 mg/dL (ref 8.6–10.4)
Chloride: 111 mmol/L — ABNORMAL HIGH (ref 98–110)
GFR, Est African American: 98 mL/min/{1.73_m2} (ref >=60)
GFR, Est Non African American: 85 mL/min/{1.73_m2} (ref >=60)
GLUCOSE: 101 mg/dL — AB (ref 65–99)
Globulin: 2.3 g/dL (calc) (ref 1.9–3.7)
Potassium: 4.2 mmol/L (ref 3.5–5.3)
Sodium: 143 mmol/L (ref 135–146)
Total Bilirubin: 0.3 mg/dL (ref 0.2–1.2)
Total Protein: 6.6 g/dL (ref 6.1–8.1)

## 2017-09-06 LAB — LIPID PANEL
CHOL/HDL RATIO: 4.2 (calc) (ref <5.0)
Cholesterol: 176 mg/dL (ref <200)
HDL: 42 mg/dL — ABNORMAL LOW (ref >50)
LDL CHOLESTEROL (CALC): 117 mg/dL — AB
Non-HDL Cholesterol (Calc): 134 mg/dL (calc) — ABNORMAL HIGH (ref <130)
Triglycerides: 76 mg/dL (ref <150)

## 2017-09-06 LAB — VITAMIN D 25 HYDROXY (VIT D DEFICIENCY, FRACTURES): Vit D, 25-Hydroxy: 22 ng/mL — ABNORMAL LOW (ref 30–100)

## 2017-09-06 LAB — ANA,IFA RA DIAG PNL W/RFLX TIT/PATN
ANA: NEGATIVE
Cyclic Citrullin Peptide Ab: <16 UNITS

## 2017-09-06 LAB — TSH: TSH: 5.11 mIU/L — ABNORMAL HIGH (ref 0.40–4.50)

## 2017-09-06 LAB — CK: Total CK: 106 U/L (ref 29–143)

## 2017-09-06 NOTE — Telephone Encounter (Signed)
Can we tell if this patient signed a medical record release when she was here last week.

## 2017-09-07 NOTE — Telephone Encounter (Signed)
Called pt, no answer. LM for pt informing her of the need to get repeat mammo in 10/2017, also of the need to take records with her. CRM created.

## 2017-09-07 NOTE — Telephone Encounter (Signed)
This won't be through our office Have her go through the hospital

## 2017-09-07 NOTE — Telephone Encounter (Signed)
We wont know until it gets scanned into her chart, if she did or not.

## 2017-09-10 ENCOUNTER — Other Ambulatory Visit: Payer: Self-pay | Admitting: Family Medicine

## 2017-09-10 MED ORDER — VITAMIN D (ERGOCALCIFEROL) 1.25 MG (50000 UNIT) PO CAPS: 50000.0000 [IU] | ORAL_CAPSULE | ORAL | 1 refills | Status: AC

## 2017-09-10 MED ORDER — LEVOTHYROXINE SODIUM 100 MCG PO TABS: 100.0000 ug | ORAL_TABLET | Freq: Every day | ORAL | 1 refills | Status: DC

## 2017-09-10 NOTE — Progress Notes (Signed)
Start vit D Rx weekly for 8 weeks, then 1000 iu daily OTC Increase thyroid med

## 2017-10-19 ENCOUNTER — Other Ambulatory Visit: Payer: Self-pay | Admitting: Family Medicine

## 2017-10-22 ENCOUNTER — Telehealth: Payer: Self-pay | Admitting: Family Medicine

## 2017-10-22 NOTE — Telephone Encounter (Signed)
Patient is due for her repeat breast imaging Make sure she knows this and that she's getting this set up in [place] Thank you

## 2017-10-22 NOTE — Telephone Encounter (Signed)
-----   Message from Arnetha Courser, MD sent at 10/20/2016  8:09 AM EDT ----- Regarding: diagnostic imaging in 1 year See May 2018 mammo

## 2017-10-25 NOTE — Telephone Encounter (Signed)
Pt husband notified 

## 2017-10-27 ENCOUNTER — Other Ambulatory Visit: Payer: Self-pay

## 2017-10-27 NOTE — Patient Outreach (Signed)
[facility]) Care Management  10/27/2017  [name] [email or web address] [birth year] [id number]   Medication Adherence call to Alice Simmons patient telephone number is disconnected Epic has the same telephone number [facility] provide a new telephone number but it's disconnected too.   [facility] Direct Dial [phone]  Fax [phone] [email or web address]

## 2017-11-04 ENCOUNTER — Other Ambulatory Visit: Payer: Self-pay | Admitting: Family Medicine

## 2017-11-18 ENCOUNTER — Other Ambulatory Visit: Payer: Self-pay | Admitting: Family Medicine

## 2017-11-30 ENCOUNTER — Other Ambulatory Visit: Payer: Self-pay | Admitting: Psychiatry

## 2017-12-31 ENCOUNTER — Other Ambulatory Visit: Payer: Self-pay | Admitting: Family Medicine

## 2018-03-24 ENCOUNTER — Other Ambulatory Visit: Payer: Self-pay | Admitting: Family Medicine

## 2018-08-17 ENCOUNTER — Telehealth: Payer: Self-pay | Admitting: Family Medicine

## 2018-08-17 NOTE — Telephone Encounter (Signed)
I called the patient to schedule AWV, but her husband said that they have relocated to [place]. VDM (DD)
